# Patient Record
Sex: Male | Born: 1985 | Race: White | Hispanic: No | Marital: Married | Smoking: Never smoker
Health system: Southern US, Community
[De-identification: ages and names within clinical notes are randomized; demographics above are authoritative.]

## PROBLEM LIST (undated history)

## (undated) DIAGNOSIS — L400 Psoriasis vulgaris: Secondary | ICD-10-CM

## (undated) DIAGNOSIS — F32A Depression, unspecified: Secondary | ICD-10-CM

## (undated) DIAGNOSIS — F419 Anxiety disorder, unspecified: Secondary | ICD-10-CM

## (undated) DIAGNOSIS — E785 Hyperlipidemia, unspecified: Secondary | ICD-10-CM

## (undated) DIAGNOSIS — M47816 Spondylosis without myelopathy or radiculopathy, lumbar region: Secondary | ICD-10-CM

## (undated) DIAGNOSIS — I10 Essential (primary) hypertension: Secondary | ICD-10-CM

## (undated) DIAGNOSIS — F3181 Bipolar II disorder: Secondary | ICD-10-CM

## (undated) HISTORY — DX: Psoriasis vulgaris: L40.0

## (undated) HISTORY — PX: KNEE SURGERY: SHX244

## (undated) HISTORY — DX: Hyperlipidemia, unspecified: E78.5

## (undated) HISTORY — PX: ROTATOR CUFF REPAIR: SHX139

## (undated) HISTORY — DX: Spondylosis without myelopathy or radiculopathy, lumbar region: M47.816

## (undated) HISTORY — PX: AMPUTATION: SHX166

## (undated) HISTORY — PX: FRACTURE SURGERY: SHX138

---
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
## 2007-04-07 ENCOUNTER — Emergency Department (HOSPITAL_COMMUNITY): Admission: EM | Admit: 2007-04-07 | Discharge: 2007-04-08 | Payer: Self-pay | Admitting: Emergency Medicine

## 2010-06-30 NOTE — Discharge Summary (Signed)
   Eric [name], Eric [name] NO.:  [id number]   MEDICAL RECORD NO.:  [id number]                  PATIENT TYPE:   LOCATION:                                       FACILITY:   PHYSICIAN:  Donna Bernard, M.D.             DATE OF BIRTH:   DATE OF ADMISSION:  DATE OF DISCHARGE:  06/16/2002                                 DISCHARGE SUMMARY   FINAL DIAGNOSES:  1. Pyelonephritis.  2. Type 1 diabetes.   FINAL DISPOSITION:  Patient discharged to home.   DISCHARGE MEDICATIONS:  Antibiotics to be picked up at [facility].   FOLLOW UP:  Follow up in the office in one week.   DIET:  1. Maintain usual diet.  2. Maintain usual insulin dose.   INITIAL HISTORY AND PHYSICAL:  Please see H&P as dictated.   HOSPITAL COURSE:  This patient is an 25 year old patient with a history of  type 1 diabetes who presented to the hospital on the day of admission with  complaints of flank pain, primarily on the right side.  He did have some  nausea.  His blood work showed pyuria.  He was given an injection for  presumed pyelonephritis.  He presented later in the evening feeling worse,  complaining of more severe flank pain and chills, and white blood count at  15,000.  CT scan was done to rule out other pathology; this was normal.  Patient was admitted to the hospital; he was started on sliding-scale  insulin.  He was given IV antibiotics, Unasyn 3 grams IV q.8h.  Additionally, he was given IV fluids and morphine as needed for pain.  Over  the next several days, he slowly improved.  On the day of discharge, patient  was stable and feeling relatively well; he still had slight tenderness in  the right flank but completely negative abdominal scan.  His white blood  count had returned to near normal.  His urinalysis had returned to normal.  Patient was discharged home with diagnoses and disposition as noted above.                                               Donna Bernard, M.D.    [name]  D:  07/01/2002  T:  07/01/2002  Job:  [id number]

## 2010-06-30 NOTE — Procedures (Signed)
   NAMECONN, TROMBETTA NO.:  [id number]   MEDICAL RECORD NO.:  [id number]                  PATIENT TYPE:   LOCATION:                                       FACILITY:   PHYSICIAN:  Fredirick Maudlin, M.D.              DATE OF BIRTH:   DATE OF PROCEDURE:  DATE OF DISCHARGE:                              PULMONARY FUNCTION TEST   1. Spirometry numbers are normal but the flow volume loop was unusual in its     shape and this could indicate something like an extrathoracic     obstruction.  2. Lung volume show normal total lung capacity.  There is some air trapping.  3. DLCO mildly reduced.                                                Fredirick Maudlin, M.D.    EL[facility]/MEDQ  D:  10/16/2001  T:  10/17/2001  Job:  [id number]

## 2011-02-01 ENCOUNTER — Inpatient Hospital Stay (HOSPITAL_COMMUNITY)
Admission: EM | Admit: 2011-02-01 | Discharge: 2011-02-03 | DRG: 295 | Disposition: A | Discharge status: Home / Self Care | Payer: BC Managed Care – PPO | Attending: Internal Medicine | Admitting: Internal Medicine

## 2011-02-01 ENCOUNTER — Encounter: Payer: Self-pay | Admitting: *Deleted

## 2011-02-01 DIAGNOSIS — Z794 Long term (current) use of insulin: Secondary | ICD-10-CM

## 2011-02-01 DIAGNOSIS — E860 Dehydration: Secondary | ICD-10-CM | POA: Diagnosis present

## 2011-02-01 DIAGNOSIS — D72829 Elevated white blood cell count, unspecified: Secondary | ICD-10-CM | POA: Diagnosis present

## 2011-02-01 DIAGNOSIS — M545 Low back pain, unspecified: Secondary | ICD-10-CM | POA: Diagnosis present

## 2011-02-01 DIAGNOSIS — E1110 Type 2 diabetes mellitus with ketoacidosis without coma: Secondary | ICD-10-CM | POA: Diagnosis present

## 2011-02-01 DIAGNOSIS — I10 Essential (primary) hypertension: Secondary | ICD-10-CM | POA: Diagnosis present

## 2011-02-01 DIAGNOSIS — F329 Major depressive disorder, single episode, unspecified: Secondary | ICD-10-CM | POA: Diagnosis present

## 2011-02-01 DIAGNOSIS — IMO0001 Reserved for inherently not codable concepts without codable children: Secondary | ICD-10-CM | POA: Diagnosis present

## 2011-02-01 DIAGNOSIS — F4321 Adjustment disorder with depressed mood: Secondary | ICD-10-CM | POA: Diagnosis present

## 2011-02-01 DIAGNOSIS — Z9641 Presence of insulin pump (external) (internal): Secondary | ICD-10-CM

## 2011-02-01 DIAGNOSIS — F32A Depression, unspecified: Secondary | ICD-10-CM | POA: Diagnosis present

## 2011-02-01 DIAGNOSIS — E109 Type 1 diabetes mellitus without complications: Secondary | ICD-10-CM | POA: Diagnosis present

## 2011-02-01 DIAGNOSIS — F411 Generalized anxiety disorder: Secondary | ICD-10-CM | POA: Diagnosis present

## 2011-02-01 DIAGNOSIS — E1010 Type 1 diabetes mellitus with ketoacidosis without coma: Principal | ICD-10-CM | POA: Diagnosis present

## 2011-02-01 DIAGNOSIS — F39 Unspecified mood [affective] disorder: Secondary | ICD-10-CM | POA: Diagnosis present

## 2011-02-01 HISTORY — DX: Essential (primary) hypertension: I10

## 2011-02-01 HISTORY — DX: Anxiety disorder, unspecified: F41.9

## 2011-02-01 LAB — COMPREHENSIVE METABOLIC PANEL
ALT: 34 U/L (ref 0–53)
AST: 20 U/L (ref 0–37)
Albumin: 5 g/dL (ref 3.5–5.2)
Alkaline Phosphatase: 158 U/L — ABNORMAL HIGH (ref 39–117)
BUN: 18 mg/dL (ref 6–23)
CO2: 12 mEq/L — ABNORMAL LOW (ref 19–32)
Calcium: 10.5 mg/dL (ref 8.4–10.5)
Chloride: 91 mEq/L — ABNORMAL LOW (ref 96–112)
Creatinine, Ser: 1.2 mg/dL (ref 0.50–1.35)
GFR calc Af Amer: >90 mL/min (ref >90)
GFR calc non Af Amer: 82 mL/min — ABNORMAL LOW (ref >90)
Glucose, Bld: 502 mg/dL — ABNORMAL HIGH (ref 70–99)
Potassium: 4.6 mEq/L (ref 3.5–5.1)
Sodium: 133 mEq/L — ABNORMAL LOW (ref 135–145)
Total Bilirubin: 0.6 mg/dL (ref 0.3–1.2)
Total Protein: 8.7 g/dL — ABNORMAL HIGH (ref 6.0–8.3)

## 2011-02-01 LAB — URINE MICROSCOPIC-ADD ON

## 2011-02-01 LAB — POCT I-STAT, CHEM 8
BUN: 19 mg/dL (ref 6–23)
Calcium, Ion: 1.33 mmol/L — ABNORMAL HIGH (ref 1.12–1.32)
Chloride: 104 mEq/L (ref 96–112)
Creatinine, Ser: 1.3 mg/dL (ref 0.50–1.35)
Glucose, Bld: 496 mg/dL — ABNORMAL HIGH (ref 70–99)
HCT: 52 % (ref 39.0–52.0)
Hemoglobin: 17.7 g/dL — ABNORMAL HIGH (ref 13.0–17.0)
Potassium: 4.6 mEq/L (ref 3.5–5.1)
Sodium: 134 mEq/L — ABNORMAL LOW (ref 135–145)
TCO2: 13 mmol/L (ref 0–100)

## 2011-02-01 LAB — CK TOTAL AND CKMB (NOT AT ARMC)
CK, MB: 3.7 ng/mL (ref 0.3–4.0)
Relative Index: 2.7 — ABNORMAL HIGH (ref 0.0–2.5)
Total CK: 135 U/L (ref 7–232)

## 2011-02-01 LAB — URINALYSIS, ROUTINE W REFLEX MICROSCOPIC
Bilirubin Urine: NEGATIVE
Glucose, UA: >1000 mg/dL — AB
Ketones, ur: >80 mg/dL — AB
Leukocytes, UA: NEGATIVE
Nitrite: NEGATIVE
Protein, ur: NEGATIVE mg/dL
Specific Gravity, Urine: 1.025 (ref 1.005–1.030)
Urobilinogen, UA: 0.2 mg/dL (ref 0.0–1.0)
pH: 5.5 (ref 5.0–8.0)

## 2011-02-01 LAB — BLOOD GAS, ARTERIAL
Acid-Base Excess: 18.4 mmol/L — ABNORMAL HIGH (ref 0.0–2.0)
Bicarbonate: 8.4 mEq/L — ABNORMAL LOW (ref 20.0–24.0)
Drawn by: 22223
FIO2: 21 %
O2 Saturation: 98.9 %
TCO2: 7.7 mmol/L (ref 0–100)
pCO2 arterial: 23.6 mmHg — ABNORMAL LOW (ref 35.0–45.0)
pH, Arterial: 7.178 — CL (ref 7.350–7.450)
pO2, Arterial: 123 mmHg — ABNORMAL HIGH (ref 80.0–100.0)

## 2011-02-01 LAB — CBC
HCT: 48.5 % (ref 39.0–52.0)
Hemoglobin: 17.3 g/dL — ABNORMAL HIGH (ref 13.0–17.0)
MCH: 30.7 pg (ref 26.0–34.0)
MCHC: 35.7 g/dL (ref 30.0–36.0)
MCV: 86 fL (ref 78.0–100.0)
Platelets: 314 10*3/uL (ref 150–400)
RBC: 5.64 MIL/uL (ref 4.22–5.81)
RDW: 12.3 % (ref 11.5–15.5)
WBC: 13.9 10*3/uL — ABNORMAL HIGH (ref 4.0–10.5)

## 2011-02-01 LAB — DIFFERENTIAL
Basophils Absolute: 0 10*3/uL (ref 0.0–0.1)
Basophils Relative: 0 % (ref 0–1)
Eosinophils Absolute: 0.1 10*3/uL (ref 0.0–0.7)
Eosinophils Relative: 0 % (ref 0–5)
Lymphocytes Relative: 7 % — ABNORMAL LOW (ref 12–46)
Lymphs Abs: 1 10*3/uL (ref 0.7–4.0)
Monocytes Absolute: 0.3 10*3/uL (ref 0.1–1.0)
Monocytes Relative: 2 % — ABNORMAL LOW (ref 3–12)
Neutro Abs: 12.5 10*3/uL — ABNORMAL HIGH (ref 1.7–7.7)
Neutrophils Relative %: 90 % — ABNORMAL HIGH (ref 43–77)

## 2011-02-01 LAB — LIPASE, BLOOD: Lipase: 12 U/L (ref 11–59)

## 2011-02-01 LAB — GLUCOSE, CAPILLARY
Glucose-Capillary: 189 mg/dL — ABNORMAL HIGH (ref 70–99)
Glucose-Capillary: 489 mg/dL — ABNORMAL HIGH (ref 70–99)

## 2011-02-01 MED ORDER — SODIUM CHLORIDE 0.9 % IV SOLN: Freq: Once | INTRAVENOUS | Status: DC

## 2011-02-01 MED ORDER — ONDANSETRON HCL 4 MG/2ML IJ SOLN
INTRAMUSCULAR | Status: AC
  Administered 2011-02-01: 4 mg
  Filled 2011-02-01: qty 2

## 2011-02-01 MED ORDER — SODIUM CHLORIDE 0.9 % IV SOLN: INTRAVENOUS | Status: DC

## 2011-02-01 MED ORDER — HYDROMORPHONE HCL PF 1 MG/ML IJ SOLN
0.5000 mg | Freq: Once | INTRAMUSCULAR | Status: AC
  Administered 2011-02-01: 0.5 mg via INTRAVENOUS
  Filled 2011-02-01: qty 1

## 2011-02-01 MED ORDER — SODIUM CHLORIDE 0.9 % IV SOLN
INTRAVENOUS | Status: DC
  Filled 2011-02-01: qty 1

## 2011-02-01 MED ORDER — ENOXAPARIN SODIUM 40 MG/0.4ML ~~LOC~~ SOLN: 40.0000 mg | SUBCUTANEOUS | Status: DC

## 2011-02-01 MED ORDER — SODIUM CHLORIDE 0.9 % IV SOLN
Freq: Once | INTRAVENOUS | Status: AC
  Administered 2011-02-01 (×2): via INTRAVENOUS

## 2011-02-01 MED ORDER — DEXTROSE-NACL 5-0.45 % IV SOLN: INTRAVENOUS | Status: DC

## 2011-02-01 MED ORDER — POTASSIUM CHLORIDE 10 MEQ/100ML IV SOLN: 10.0000 meq | INTRAVENOUS | Status: DC

## 2011-02-01 MED ORDER — DEXTROSE 50 % IV SOLN: 25.0000 mL | INTRAVENOUS | Status: DC | PRN

## 2011-02-01 NOTE — ED Notes (Signed)
Family member says recent loss of stillborn child

## 2011-02-01 NOTE — ED Notes (Signed)
Respiratory at the bedside

## 2011-02-01 NOTE — ED Notes (Signed)
Vomiting for 2 days , no diarrhea.no fever, or chills

## 2011-02-01 NOTE — H&P (Addendum)
Eric [name] is an 25 y.o. male.    PCP: Colette Ribas, MD, MD   His endocrinologist is Dr. Talmage Nap in [place]  Chief Complaint: High blood sugar and lower back pain  HPI: This is a 25 year old, white male with a past medical history of type 1 diabetes, who was in his usual state of health till a few days ago, when he ran out of his insulin for the insulin pump that he uses to control his diabetes. Unfortunately, has not been using any other long-acting insulin and has been giving himself the short-acting Humalog. Today he checked his blood sugar at home, and it was greater than 500 and he decided to come in to the hospital. Has been having nausea along with vomiting. Denies any fever or chills. Denies any abdominal pain. Denies any sick contacts. Denies any difficulty with urination.  A week or so ago patient apparently had a stillborn child. He has been very depressed about it and has required medications in the form of Valium, citalopram, trazodone. These were prescribed to him within the last 10 days or so. He's been taking Valium, almost every 3-4 hours. He denies any intention to hurt himself.  He also tells me that he has been having lower back pain that started once the the nausea and vomiting began. Denies any difficulty with ambulation. The pain is worse when he has to bend down to pick up something from the floor. Denies any shooting pain down his legs. The pain is across the lower back equal, and both sides. He tells me he was started on the pravastatin recently. He was also started on metoprolol recently. Prior to all these new medications being initiated in the last week or so he was only on insulin.   Prior to Admission medications   Medication Sig Start Date End Date Taking? Authorizing Provider  diazepam (VALIUM) 10 MG tablet Take 10 mg by mouth 3 (three) times daily as needed. For anxiety    Yes Historical Provider, MD  metoprolol tartrate (LOPRESSOR) 25 MG tablet  Take 25 mg by mouth 2 (two) times daily.     Yes Historical Provider, MD  citalopram (CELEXA) 10 MG tablet Take 10 mg by mouth daily.      Historical Provider, MD  insulin lispro (HUMALOG) 100 UNIT/ML injection by Continuous infusion (non-IV) route daily. Based on blood sugar levels per patient     Historical Provider, MD  pravastatin (PRAVACHOL) 10 MG tablet Take 10 mg by mouth at bedtime.      Historical Provider, MD  traZODone (DESYREL) 50 MG tablet Take 50 mg by mouth at bedtime.      Historical Provider, MD    Allergies: No Known Allergies  Past Medical History  Diagnosis Date  . Diabetes mellitus   . Hypertension   . Anxiety     Past Surgical History  Procedure Date  . Knee surgery     Social History:  reports that he has never smoked. He does not have any smokeless tobacco history on file. He reports that he drinks alcohol. He reports that he does not use illicit drugs.  Family History:  Family History  Problem Relation Age of Onset  . Hypertension Mother   . Hypertension Father     Review of Systems - History obtained from the patient General ROS: positive for  - fatigue Psychological ROS: positive for - depression Ophthalmic ROS: negative ENT ROS: negative Hematological and Lymphatic ROS: negative Endocrine ROS: negative  Respiratory ROS: no cough, shortness of breath, or wheezing Cardiovascular ROS: no chest pain or dyspnea on exertion Gastrointestinal ROS: no abdominal pain, change in bowel habits, or black or bloody stools Genito-Urinary ROS: no dysuria, trouble voiding, or hematuria Musculoskeletal ROS: positive for - pain in back - lower Neurological ROS: negative Dermatological ROS: negative  Physical Examination Blood pressure 128/77, pulse 115, temperature 98.3 F (36.8 C), temperature source Oral, resp. rate 22, height 5' 9.5" (1.765 m), weight 68.04 kg (150 lb), SpO2 99.00%.  General appearance: alert, cooperative, appears stated age and no  distress Head: Normocephalic, without obvious abnormality, atraumatic Eyes: conjunctivae/corneas clear. PERRL, EOM's intact. Fundi benign. Throat: lips, mucosa, and tongue normal; teeth and gums normal and dry mm Neck: no adenopathy, no carotid bruit, no JVD, supple, symmetrical, trachea midline and thyroid not enlarged, symmetric, no tenderness/mass/nodules Resp: clear to auscultation bilaterally Cardio: regular rate and rhythm, S1, S2 normal, no murmur, click, rub or gallop GI: soft, non-tender; bowel sounds normal; no masses,  no organomegaly Extremities: extremities normal, atraumatic, no cyanosis or edema Pulses: 2+ and symmetric Skin: Skin color, texture, turgor normal. No rashes or lesions Lymph nodes: Cervical, supraclavicular, and axillary nodes normal. Neurologic: Alert and oriented X 3, normal strength and tone. Normal symmetric reflexes. Normal coordination and gait Examination of the back did not reveal any abnormalities. There was no point tenderness in the lower back. There was no point tenderness over the spine area. Straight leg raising test were negative.   Results for orders placed during the hospital encounter of 02/01/11 (from the past 48 hour(s))  GLUCOSE, CAPILLARY     Status: Abnormal   Collection Time   02/01/11  8:52 PM      Component Value Range Comment   Glucose-Capillary 489 (*) 70 - 99 (mg/dL)   CBC     Status: Abnormal   Collection Time   02/01/11  9:14 PM      Component Value Range Comment   WBC 13.9 (*) 4.0 - 10.5 (K/uL)    RBC 5.64  4.22 - 5.81 (MIL/uL)    Hemoglobin 17.3 (*) 13.0 - 17.0 (g/dL)    HCT 16.1  09.6 - 04.5 (%)    MCV 86.0  78.0 - 100.0 (fL)    MCH 30.7  26.0 - 34.0 (pg)    MCHC 35.7  30.0 - 36.0 (g/dL)    RDW 40.9  81.1 - 91.4 (%)    Platelets 314  150 - 400 (K/uL)   DIFFERENTIAL     Status: Abnormal   Collection Time   02/01/11  9:14 PM      Component Value Range Comment   Neutrophils Relative 90 (*) 43 - 77 (%)    Neutro Abs  12.5 (*) 1.7 - 7.7 (K/uL)    Lymphocytes Relative 7 (*) 12 - 46 (%)    Lymphs Abs 1.0  0.7 - 4.0 (K/uL)    Monocytes Relative 2 (*) 3 - 12 (%)    Monocytes Absolute 0.3  0.1 - 1.0 (K/uL)    Eosinophils Relative 0  0 - 5 (%)    Eosinophils Absolute 0.1  0.0 - 0.7 (K/uL)    Basophils Relative 0  0 - 1 (%)    Basophils Absolute 0.0  0.0 - 0.1 (K/uL)   COMPREHENSIVE METABOLIC PANEL     Status: Abnormal   Collection Time   02/01/11  9:14 PM      Component Value Range Comment   Sodium 133 (*) 135 -  145 (mEq/L)    Potassium 4.6  3.5 - 5.1 (mEq/L)    Chloride 91 (*) 96 - 112 (mEq/L)    CO2 12 (*) 19 - 32 (mEq/L)    Glucose, Bld 502 (*) 70 - 99 (mg/dL)    BUN 18  6 - 23 (mg/dL)    Creatinine, Ser 1.19  0.50 - 1.35 (mg/dL)    Calcium 14.7  8.4 - 10.5 (mg/dL)    Total Protein 8.7 (*) 6.0 - 8.3 (g/dL)    Albumin 5.0  3.5 - 5.2 (g/dL)    AST 20  0 - 37 (U/L)    ALT 34  0 - 53 (U/L)    Alkaline Phosphatase 158 (*) 39 - 117 (U/L)    Total Bilirubin 0.6  0.3 - 1.2 (mg/dL)    GFR calc non Af Amer 82 (*) >90 (mL/min)    GFR calc Af Amer >90  >90 (mL/min)   URINALYSIS, ROUTINE W REFLEX MICROSCOPIC     Status: Abnormal   Collection Time   02/01/11  9:24 PM      Component Value Range Comment   Color, Urine YELLOW  YELLOW     APPearance CLEAR  CLEAR     Specific Gravity, Urine 1.025  1.005 - 1.030     pH 5.5  5.0 - 8.0     Glucose, UA >1000 (*) NEGATIVE (mg/dL)    Hgb urine dipstick TRACE (*) NEGATIVE     Bilirubin Urine NEGATIVE  NEGATIVE     Ketones, ur >80 (*) NEGATIVE (mg/dL)    Protein, ur NEGATIVE  NEGATIVE (mg/dL)    Urobilinogen, UA 0.2  0.0 - 1.0 (mg/dL)    Nitrite NEGATIVE  NEGATIVE     Leukocytes, UA NEGATIVE  NEGATIVE    URINE MICROSCOPIC-ADD ON     Status: Normal   Collection Time   02/01/11  9:24 PM      Component Value Range Comment   Squamous Epithelial / LPF RARE  RARE     RBC / HPF 0-2  <3 (RBC/hpf)    Bacteria, UA RARE  RARE    POCT I-STAT, CHEM 8     Status: Abnormal    Collection Time   02/01/11  9:29 PM      Component Value Range Comment   Sodium 134 (*) 135 - 145 (mEq/L)    Potassium 4.6  3.5 - 5.1 (mEq/L)    Chloride 104  96 - 112 (mEq/L)    BUN 19  6 - 23 (mg/dL)    Creatinine, Ser 8.29  0.50 - 1.35 (mg/dL)    Glucose, Bld 562 (*) 70 - 99 (mg/dL)    Calcium, Ion 1.30 (*) 1.12 - 1.32 (mmol/L)    TCO2 13  0 - 100 (mmol/L)    Hemoglobin 17.7 (*) 13.0 - 17.0 (g/dL)    HCT 86.5  78.4 - 69.6 (%)   BLOOD GAS, ARTERIAL     Status: Abnormal   Collection Time   02/01/11  9:45 PM      Component Value Range Comment   FIO2 21.00      Delivery systems ROOM AIR      pH, Arterial 7.178 (*) 7.350 - 7.450     pCO2 arterial 23.6 (*) 35.0 - 45.0 (mmHg)    pO2, Arterial 123.0 (*) 80.0 - 100.0 (mmHg)    Bicarbonate 8.4 (*) 20.0 - 24.0 (mEq/L)    TCO2 7.7  0 - 100 (mmol/L)    Acid-Base Excess  18.4 (*) 0.0 - 2.0 (mmol/L)    O2 Saturation 98.9      Collection site RIGHT RADIAL      Drawn by [id number]      Sample type ARTERIAL      Allens test (pass/fail) PASS  PASS     No results found.   Assessment/Plan  Principal Problem:  *DKA (diabetic ketoacidoses) Active Problems:  DM type 1 (diabetes mellitus, type 1)  Low back pain  Depression  Elevated blood pressure   #1 diabetic ketoacidosis. His anion gap is 30. He'll be put on the DKA protocol with IV insulin. He'll be given IV fluids aggressively. HbA1c will be checked. He's been told to be compliant with this insulin regimen in the future. He needs to have a backup plan in case the pump malfunctions. He should be on a long-acting insulin if he is not on insulin pump. All this has been told to the patient very categorically. He's been asked to followup with Dr. Talmage Nap soon as he gets out of the hospital.  #2 low back pain: Etiology for this is not entirely clear. I don't see any neurological deficits in this patient at this time. There is no abnormal finding on physical examination. Since he was started on a  statin medication recently, myopathy is a possibility and so a total CK level will be checked. We can consider getting films of his lumbar spine, which can be obtained in the morning. We'll give him pain medications to control her symptoms. UA doesn't suggest any infection and no significant RBC's.  #3 history of depression/grief reaction: This appears to be recent due to the stillborn child. Continue with the citalopram for now. Will discontinue the Valium. Will switch him to alprazolam at a lower dose. We will have our behavioral health team assess him in the morning.  #4 recent high blood pressure readings: Unclear if he truly has hypertension. Blood pressures could have been elevated recently due to his recent stress. Will monitor his blood pressures closely in the hospital here. Continue with metoprolol for now.  DVT, prophylaxis will be initiated.  Patient is a full code.  Further management decisions will depend on results of further testing and patient's response to treatment.  [facility]  [facility] Pager [phone]  02/01/2011, 11:11 PM  Patient's blood sugar decreased to 180 in the ED and his acidosis had improved. Per Rn he was feeling better without nausea or vomiting. It was felt patient could be given SQ insulin at that point. He was then sent to the floor.   However as soon as he reached the floor patient vomited. Stat labs revealed his acidosis had gotten worse. It was felt patient would be best served by a few hours of IV insulin. So he was sent to SDU.  Since his back/flank pain persisted we went ahead with Ct of his Abdomen and pelvis which revealed no spinal abnormality nor any nephrolithiasis. This could be some form of muscle sprain. Will try muscle relaxants. If symptoms do not improve will need further evaluation.  Junior Huezo 02/02/2011 4:21 AM

## 2011-02-01 NOTE — ED Notes (Signed)
CRITICAL VALUE ALERT  Critical value received:  Ph7.178,co23.6,po2 123,bicarb 8.4  Date of notification:  02/01/11  Time of notification:  2158  Critical value read back:yes  Nurse who received alert:  Kathlene Cote, RN  MD notified (1st page):  Dr. Jeraldine Loots  Time of first page:   MD notified (2nd page):  Time of second page:  Responding MD:    Time MD responded:

## 2011-02-01 NOTE — ED Provider Notes (Signed)
History  Scribed for Carleene Cooper III, MD, the patient was seen in room APA09/APA09. This chart was scribed by Candelaria Stagers. The patient's care started at 9:10 PM   CSN: [id number]  Arrival date & time 02/01/11  2041   First MD Initiated Contact with Patient 02/01/11 2108      Chief Complaint  Patient presents with  . Emesis     The history is provided by the patient.   YASEEN GILBERG is a 25 y.o. male who presents to the Emergency Department complaining of constant lower back pain and vomiting that started two days ago.  Pt reports he is diabetic and typically uses an insulin pump, but has ran out.  His sugar levels have been as high as 552 today.  Pt is tired, states he feel delirious, and that his sx have gradually gotten worse.  He also states that he has been stressed more than usual after the death of a child and was prescribed Xanax recently.  His PCP is Dr. Phillips Odor and he also see Dr. Horald Pollen.    Past Medical History  Diagnosis Date  . Diabetes mellitus   . Hypertension   . Anxiety     Past Surgical History  Procedure Date  . Knee surgery     History reviewed. No pertinent family history.  History  Substance Use Topics  . Smoking status: Never Smoker   . Smokeless tobacco: Not on file  . Alcohol Use: Yes      Review of Systems  Constitutional: Positive for appetite change.       10 Systems reviewed and are negative for acute change except as noted in the HPI.  HENT: Negative for congestion.   Eyes: Negative for discharge and redness.  Respiratory: Negative for shortness of breath.   Cardiovascular: Negative for chest pain.  Gastrointestinal: Positive for nausea.  Musculoskeletal: Positive for back pain.  Skin: Negative for rash.  Neurological: Negative for syncope and numbness.  Psychiatric/Behavioral: Positive for confusion.       No behavior change.    Allergies  Review of patient's allergies indicates no known allergies.  Home Medications     Current Outpatient Rx  Name Route Sig Dispense Refill  . DIAZEPAM 10 MG PO TABS Oral Take 10 mg by mouth 3 (three) times daily as needed. For anxiety     . METOPROLOL TARTRATE 25 MG PO TABS Oral Take 25 mg by mouth 2 (two) times daily.      Marland Kitchen CITALOPRAM HYDROBROMIDE 10 MG PO TABS Oral Take 10 mg by mouth daily.      . INSULIN LISPRO (HUMAN) 100 UNIT/ML [place] SOLN Continuous infusion (non-IV) by Continuous infusion (non-IV) route daily. Based on blood sugar levels per patient     . PRAVASTATIN SODIUM 10 MG PO TABS Oral Take 10 mg by mouth at bedtime.      . TRAZODONE HCL 50 MG PO TABS Oral Take 50 mg by mouth at bedtime.        BP 156/93  Pulse 132  Temp(Src) 98.3 F (36.8 C) (Oral)  Resp 22  Ht 5' 9.5" (1.765 m)  Wt 150 lb (68.04 kg)  BMI 21.83 kg/m2  SpO2 99%  Physical Exam  Nursing note and vitals reviewed. Constitutional: He appears well-developed and well-nourished.  HENT:  Head: Normocephalic and atraumatic.       Mouth is dry  Eyes: Right eye exhibits no discharge. Left eye exhibits no discharge.  Neck: Normal range of motion.  Neck supple.  Cardiovascular: Normal heart sounds.        tachycardic  Pulmonary/Chest: Effort normal. He exhibits no tenderness.  Abdominal: Soft. There is no tenderness. There is no rebound.  Musculoskeletal: He exhibits no tenderness.       Localized pain to the lower back, but no deformity.   Neurological:       Mental status and motor strength appears baseline for patient and situation.  Skin: No rash noted.       Some loss of skin turgor.   Psychiatric: Thought content normal.       States feeling delirious.     ED Course  CRITICAL CARE Performed by: Osvaldo Human Authorized by: Osvaldo Human Total critical care time: 60 minutes Critical care was necessary to treat or prevent imminent or life-threatening deterioration of the following conditions: dehydration and metabolic crisis. Critical care was time spent personally by  me on the following activities: discussions with consultants, development of treatment plan with patient or surrogate, evaluation of patient's response to treatment, examination of patient, obtaining history from patient or surrogate, ordering and performing treatments and interventions, ordering and review of laboratory studies, re-evaluation of patient's condition and review of old charts.    DIAGNOSTIC STUDIES Oxygen Saturation is 99% on room air, normal by my interpretation.    COORDINATION OF CARE:  9:18PM Ordered: CBC ; Differential ; I-Stat, Chem 8 ; Comprehensive metabolic panel ; Urinalysis, Routine w reflex microscopic, BLOOD GAS, ARTERIAL, 0.9 % sodium chloride infusion      Labs Reviewed  GLUCOSE, CAPILLARY - Abnormal; Notable for the following:    Glucose-Capillary 489 (*)    All other components within normal limits  CBC - Abnormal; Notable for the following:    WBC 13.9 (*)    Hemoglobin 17.3 (*)    All other components within normal limits  DIFFERENTIAL - Abnormal; Notable for the following:    Neutrophils Relative 90 (*)    Neutro Abs 12.5 (*)    Lymphocytes Relative 7 (*)    Monocytes Relative 2 (*)    All other components within normal limits  COMPREHENSIVE METABOLIC PANEL - Abnormal; Notable for the following:    Sodium 133 (*)    Chloride 91 (*)    CO2 12 (*)    Glucose, Bld 502 (*)    Total Protein 8.7 (*)    Alkaline Phosphatase 158 (*)    GFR calc non Af Amer 82 (*)    All other components within normal limits  URINALYSIS, ROUTINE W REFLEX MICROSCOPIC - Abnormal; Notable for the following:    Glucose, UA >1000 (*)    Hgb urine dipstick TRACE (*)    Ketones, ur >80 (*)    All other components within normal limits  BLOOD GAS, ARTERIAL - Abnormal; Notable for the following:    pH, Arterial 7.178 (*)    pCO2 arterial 23.6 (*)    pO2, Arterial 123.0 (*)    Bicarbonate 8.4 (*)    Acid-Base Excess 18.4 (*)    All other components within normal limits   POCT I-STAT, CHEM 8 - Abnormal; Notable for the following:    Sodium 134 (*)    Glucose, Bld 496 (*)    Calcium, Ion 1.33 (*)    Hemoglobin 17.7 (*)    All other components within normal limits  URINE MICROSCOPIC-ADD ON  I-STAT, CHEM 8  URINE CULTURE   Results for orders placed during the hospital encounter of 02/01/11  GLUCOSE, CAPILLARY      Component Value Range   Glucose-Capillary 489 (*) 70 - 99 (mg/dL)  CBC      Component Value Range   WBC 13.9 (*) 4.0 - 10.5 (K/uL)   RBC 5.64  4.22 - 5.81 (MIL/uL)   Hemoglobin 17.3 (*) 13.0 - 17.0 (g/dL)   HCT 16.1  09.6 - 04.5 (%)   MCV 86.0  78.0 - 100.0 (fL)   MCH 30.7  26.0 - 34.0 (pg)   MCHC 35.7  30.0 - 36.0 (g/dL)   RDW 40.9  81.1 - 91.4 (%)   Platelets 314  150 - 400 (K/uL)  DIFFERENTIAL      Component Value Range   Neutrophils Relative 90 (*) 43 - 77 (%)   Neutro Abs 12.5 (*) 1.7 - 7.7 (K/uL)   Lymphocytes Relative 7 (*) 12 - 46 (%)   Lymphs Abs 1.0  0.7 - 4.0 (K/uL)   Monocytes Relative 2 (*) 3 - 12 (%)   Monocytes Absolute 0.3  0.1 - 1.0 (K/uL)   Eosinophils Relative 0  0 - 5 (%)   Eosinophils Absolute 0.1  0.0 - 0.7 (K/uL)   Basophils Relative 0  0 - 1 (%)   Basophils Absolute 0.0  0.0 - 0.1 (K/uL)  COMPREHENSIVE METABOLIC PANEL      Component Value Range   Sodium 133 (*) 135 - 145 (mEq/L)   Potassium 4.6  3.5 - 5.1 (mEq/L)   Chloride 91 (*) 96 - 112 (mEq/L)   CO2 12 (*) 19 - 32 (mEq/L)   Glucose, Bld 502 (*) 70 - 99 (mg/dL)   BUN 18  6 - 23 (mg/dL)   Creatinine, Ser 7.82  0.50 - 1.35 (mg/dL)   Calcium 95.6  8.4 - 10.5 (mg/dL)   Total Protein 8.7 (*) 6.0 - 8.3 (g/dL)   Albumin 5.0  3.5 - 5.2 (g/dL)   AST 20  0 - 37 (U/L)   ALT 34  0 - 53 (U/L)   Alkaline Phosphatase 158 (*) 39 - 117 (U/L)   Total Bilirubin 0.6  0.3 - 1.2 (mg/dL)   GFR calc non Af Amer 82 (*) >90 (mL/min)   GFR calc Af Amer >90  >90 (mL/min)  URINALYSIS, ROUTINE W REFLEX MICROSCOPIC      Component Value Range   Color, Urine YELLOW  YELLOW     APPearance CLEAR  CLEAR    Specific Gravity, Urine 1.025  1.005 - 1.030    pH 5.5  5.0 - 8.0    Glucose, UA >1000 (*) NEGATIVE (mg/dL)   Hgb urine dipstick TRACE (*) NEGATIVE    Bilirubin Urine NEGATIVE  NEGATIVE    Ketones, ur >80 (*) NEGATIVE (mg/dL)   Protein, ur NEGATIVE  NEGATIVE (mg/dL)   Urobilinogen, UA 0.2  0.0 - 1.0 (mg/dL)   Nitrite NEGATIVE  NEGATIVE    Leukocytes, UA NEGATIVE  NEGATIVE   BLOOD GAS, ARTERIAL      Component Value Range   FIO2 21.00     Delivery systems ROOM AIR     pH, Arterial 7.178 (*) 7.350 - 7.450    pCO2 arterial 23.6 (*) 35.0 - 45.0 (mmHg)   pO2, Arterial 123.0 (*) 80.0 - 100.0 (mmHg)   Bicarbonate 8.4 (*) 20.0 - 24.0 (mEq/L)   TCO2 7.7  0 - 100 (mmol/L)   Acid-Base Excess 18.4 (*) 0.0 - 2.0 (mmol/L)   O2 Saturation 98.9     Collection site RIGHT RADIAL  Drawn by [id number]     Sample type ARTERIAL     Allens test (pass/fail) PASS  PASS   POCT I-STAT, CHEM 8      Component Value Range   Sodium 134 (*) 135 - 145 (mEq/L)   Potassium 4.6  3.5 - 5.1 (mEq/L)   Chloride 104  96 - 112 (mEq/L)   BUN 19  6 - 23 (mg/dL)   Creatinine, Ser 6.04  0.50 - 1.35 (mg/dL)   Glucose, Bld 540 (*) 70 - 99 (mg/dL)   Calcium, Ion 9.81 (*) 1.12 - 1.32 (mmol/L)   TCO2 13  0 - 100 (mmol/L)   Hemoglobin 17.7 (*) 13.0 - 17.0 (g/dL)   HCT 19.1  47.8 - 29.5 (%)  URINE MICROSCOPIC-ADD ON      Component Value Range   Squamous Epithelial / LPF RARE  RARE    RBC / HPF 0-2  <3 (RBC/hpf)   Bacteria, UA RARE  RARE    10:13 PM ABG's show a metabolic acidosis, glucose is high at 496 --> Dx DKA.  Rx with glucommander.  Call to Osvaldo Shipper, M.D. To admit pt.       1. DKA (diabetic ketoacidoses)   2. Elevated blood pressure   3. Low back pain   4. Depression       MDM  Diabetic ketoacidosis; will give fluids, order CBC and   I personally performed the services described in this documentation, which was scribed in my presence. The recorded information has been  reviewed and considered.    Carleene Cooper III, MD 02/02/11 2218

## 2011-02-01 NOTE — ED Notes (Signed)
Report given to Laurel, Charity fundraiser.

## 2011-02-01 NOTE — ED Notes (Signed)
Critical results called on patient by kimberly. Documented, information given to dr. Jeraldine Loots.

## 2011-02-02 ENCOUNTER — Encounter (HOSPITAL_COMMUNITY): Payer: Self-pay | Admitting: *Deleted

## 2011-02-02 ENCOUNTER — Inpatient Hospital Stay (HOSPITAL_COMMUNITY): Payer: BC Managed Care – PPO

## 2011-02-02 LAB — BASIC METABOLIC PANEL
BUN: 12 mg/dL (ref 6–23)
BUN: 12 mg/dL (ref 6–23)
BUN: 15 mg/dL (ref 6–23)
BUN: 5 mg/dL — ABNORMAL LOW (ref 6–23)
BUN: 7 mg/dL (ref 6–23)
BUN: 9 mg/dL (ref 6–23)
CO2: 10 mEq/L — CL (ref 19–32)
CO2: 14 mEq/L — ABNORMAL LOW (ref 19–32)
CO2: 17 mEq/L — ABNORMAL LOW (ref 19–32)
CO2: 21 mEq/L (ref 19–32)
CO2: 22 mEq/L (ref 19–32)
CO2: 9 mEq/L — CL (ref 19–32)
Calcium: 8.6 mg/dL (ref 8.4–10.5)
Calcium: 8.6 mg/dL (ref 8.4–10.5)
Calcium: 8.8 mg/dL (ref 8.4–10.5)
Calcium: 8.9 mg/dL (ref 8.4–10.5)
Calcium: 9 mg/dL (ref 8.4–10.5)
Calcium: 9.1 mg/dL (ref 8.4–10.5)
Chloride: 101 mEq/L (ref 96–112)
Chloride: 102 mEq/L (ref 96–112)
Chloride: 104 mEq/L (ref 96–112)
Chloride: 105 mEq/L (ref 96–112)
Chloride: 105 mEq/L (ref 96–112)
Chloride: 98 mEq/L (ref 96–112)
Creatinine, Ser: 0.88 mg/dL (ref 0.50–1.35)
Creatinine, Ser: 0.95 mg/dL (ref 0.50–1.35)
Creatinine, Ser: 0.96 mg/dL (ref 0.50–1.35)
Creatinine, Ser: 1 mg/dL (ref 0.50–1.35)
Creatinine, Ser: 1.01 mg/dL (ref 0.50–1.35)
Creatinine, Ser: 1.02 mg/dL (ref 0.50–1.35)
GFR calc Af Amer: >90 mL/min (ref >90)
GFR calc Af Amer: >90 mL/min (ref >90)
GFR calc Af Amer: >90 mL/min (ref >90)
GFR calc Af Amer: >90 mL/min (ref >90)
GFR calc Af Amer: >90 mL/min (ref >90)
GFR calc Af Amer: >90 mL/min (ref >90)
GFR calc non Af Amer: >90 mL/min (ref >90)
GFR calc non Af Amer: >90 mL/min (ref >90)
GFR calc non Af Amer: >90 mL/min (ref >90)
GFR calc non Af Amer: >90 mL/min (ref >90)
GFR calc non Af Amer: >90 mL/min (ref >90)
GFR calc non Af Amer: >90 mL/min (ref >90)
Glucose, Bld: 102 mg/dL — ABNORMAL HIGH (ref 70–99)
Glucose, Bld: 185 mg/dL — ABNORMAL HIGH (ref 70–99)
Glucose, Bld: 249 mg/dL — ABNORMAL HIGH (ref 70–99)
Glucose, Bld: 252 mg/dL — ABNORMAL HIGH (ref 70–99)
Glucose, Bld: 281 mg/dL — ABNORMAL HIGH (ref 70–99)
Glucose, Bld: 299 mg/dL — ABNORMAL HIGH (ref 70–99)
Potassium: 3.4 mEq/L — ABNORMAL LOW (ref 3.5–5.1)
Potassium: 3.5 mEq/L (ref 3.5–5.1)
Potassium: 4.2 mEq/L (ref 3.5–5.1)
Potassium: 4.9 mEq/L (ref 3.5–5.1)
Potassium: 5 mEq/L (ref 3.5–5.1)
Potassium: 5.1 mEq/L (ref 3.5–5.1)
Sodium: 133 mEq/L — ABNORMAL LOW (ref 135–145)
Sodium: 134 mEq/L — ABNORMAL LOW (ref 135–145)
Sodium: 135 mEq/L (ref 135–145)
Sodium: 137 mEq/L (ref 135–145)
Sodium: 138 mEq/L (ref 135–145)
Sodium: 139 mEq/L (ref 135–145)

## 2011-02-02 LAB — GLUCOSE, CAPILLARY
Glucose-Capillary: 279 mg/dL — ABNORMAL HIGH (ref 70–99)
Glucose-Capillary: 298 mg/dL — ABNORMAL HIGH (ref 70–99)
Glucose-Capillary: 214 mg/dL — ABNORMAL HIGH (ref 70–99)
Glucose-Capillary: 205 mg/dL — ABNORMAL HIGH (ref 70–99)
Glucose-Capillary: 145 mg/dL — ABNORMAL HIGH (ref 70–99)
Glucose-Capillary: 100 mg/dL — ABNORMAL HIGH (ref 70–99)
Glucose-Capillary: 95 mg/dL (ref 70–99)
Glucose-Capillary: 126 mg/dL — ABNORMAL HIGH (ref 70–99)
Glucose-Capillary: 317 mg/dL — ABNORMAL HIGH (ref 70–99)
Glucose-Capillary: 252 mg/dL — ABNORMAL HIGH (ref 70–99)
Glucose-Capillary: 227 mg/dL — ABNORMAL HIGH (ref 70–99)
Glucose-Capillary: 273 mg/dL — ABNORMAL HIGH (ref 70–99)
Glucose-Capillary: 295 mg/dL — ABNORMAL HIGH (ref 70–99)
Glucose-Capillary: 286 mg/dL — ABNORMAL HIGH (ref 70–99)
Glucose-Capillary: 277 mg/dL — ABNORMAL HIGH (ref 70–99)
Glucose-Capillary: 155 mg/dL — ABNORMAL HIGH (ref 70–99)

## 2011-02-02 LAB — CBC
HCT: 39.5 % (ref 39.0–52.0)
Hemoglobin: 14.2 g/dL (ref 13.0–17.0)
MCH: 30.2 pg (ref 26.0–34.0)
MCHC: 35.9 g/dL (ref 30.0–36.0)
MCV: 84 fL (ref 78.0–100.0)
Platelets: 218 10*3/uL (ref 150–400)
RBC: 4.7 MIL/uL (ref 4.22–5.81)
RDW: 12.4 % (ref 11.5–15.5)
WBC: 14.6 10*3/uL — ABNORMAL HIGH (ref 4.0–10.5)

## 2011-02-02 LAB — HEMOGLOBIN A1C
Hgb A1c MFr Bld: 8.8 % — ABNORMAL HIGH (ref <5.7)
Mean Plasma Glucose: 206 mg/dL — ABNORMAL HIGH (ref <117)

## 2011-02-02 LAB — MRSA PCR SCREENING: MRSA by PCR: NEGATIVE

## 2011-02-02 MED ORDER — SODIUM CHLORIDE 0.9 % IJ SOLN
INTRAMUSCULAR | Status: AC
  Administered 2011-02-02: 10 mL
  Filled 2011-02-02: qty 3

## 2011-02-02 MED ORDER — INSULIN GLARGINE 100 UNIT/ML ~~LOC~~ SOLN: 10.0000 [IU] | Freq: Two times a day (BID) | SUBCUTANEOUS | Status: DC

## 2011-02-02 MED ORDER — POTASSIUM CHLORIDE 10 MEQ/100ML IV SOLN: 10.0000 meq | INTRAVENOUS | Status: DC

## 2011-02-02 MED ORDER — ONDANSETRON HCL 4 MG/2ML IJ SOLN
4.0000 mg | Freq: Four times a day (QID) | INTRAMUSCULAR | Status: DC | PRN
  Administered 2011-02-02: 4 mg via INTRAVENOUS
  Filled 2011-02-02: qty 2

## 2011-02-02 MED ORDER — METOPROLOL TARTRATE 25 MG PO TABS
25.0000 mg | ORAL_TABLET | Freq: Two times a day (BID) | ORAL | Status: DC
  Administered 2011-02-02 – 2011-02-03 (×4): 25 mg via ORAL
  Filled 2011-02-02 (×4): qty 1

## 2011-02-02 MED ORDER — INSULIN ASPART 100 UNIT/ML ~~LOC~~ SOLN: 0.0000 [IU] | Freq: Every day | SUBCUTANEOUS | Status: DC

## 2011-02-02 MED ORDER — INSULIN ASPART 100 UNIT/ML ~~LOC~~ SOLN: 0.0000 [IU] | Freq: Three times a day (TID) | SUBCUTANEOUS | Status: DC

## 2011-02-02 MED ORDER — PNEUMOCOCCAL VAC POLYVALENT 25 MCG/0.5ML IJ INJ
0.5000 mL | INJECTION | INTRAMUSCULAR | Status: AC
  Administered 2011-02-03: 0.5 mL via INTRAMUSCULAR
  Filled 2011-02-02: qty 0.5

## 2011-02-02 MED ORDER — CITALOPRAM HYDROBROMIDE 20 MG PO TABS
10.0000 mg | ORAL_TABLET | Freq: Every day | ORAL | Status: DC
  Administered 2011-02-02 – 2011-02-03 (×2): 10 mg via ORAL
  Filled 2011-02-02 (×2): qty 1

## 2011-02-02 MED ORDER — DEXTROSE-NACL 5-0.45 % IV SOLN
INTRAVENOUS | Status: DC
  Administered 2011-02-02: 125 mL via INTRAVENOUS

## 2011-02-02 MED ORDER — SODIUM CHLORIDE 0.9 % IV SOLN
INTRAVENOUS | Status: DC
  Administered 2011-02-02 – 2011-02-03 (×3): 150 mL via INTRAVENOUS

## 2011-02-02 MED ORDER — INSULIN GLARGINE 100 UNIT/ML ~~LOC~~ SOLN
SUBCUTANEOUS | Status: AC
  Filled 2011-02-02: qty 3

## 2011-02-02 MED ORDER — METHOCARBAMOL 100 MG/ML IJ SOLN
500.0000 mg | Freq: Four times a day (QID) | INTRAVENOUS | Status: DC | PRN
  Administered 2011-02-02: 500 mg via INTRAVENOUS
  Filled 2011-02-02 (×2): qty 5

## 2011-02-02 MED ORDER — INSULIN GLARGINE 100 UNIT/ML ~~LOC~~ SOLN
10.0000 [IU] | Freq: Once | SUBCUTANEOUS | Status: AC
  Administered 2011-02-02: 10 [IU] via SUBCUTANEOUS
  Filled 2011-02-02: qty 1

## 2011-02-02 MED ORDER — ENOXAPARIN SODIUM 40 MG/0.4ML ~~LOC~~ SOLN
40.0000 mg | SUBCUTANEOUS | Status: DC
  Administered 2011-02-02 – 2011-02-03 (×2): 40 mg via SUBCUTANEOUS
  Filled 2011-02-02 (×2): qty 0.4

## 2011-02-02 MED ORDER — INSULIN ASPART 100 UNIT/ML ~~LOC~~ SOLN: 3.0000 [IU] | Freq: Three times a day (TID) | SUBCUTANEOUS | Status: DC

## 2011-02-02 MED ORDER — INSULIN REGULAR HUMAN 100 UNIT/ML IJ SOLN
INTRAMUSCULAR | Status: AC
  Filled 2011-02-02: qty 3

## 2011-02-02 MED ORDER — OXYCODONE HCL 5 MG PO TABS
5.0000 mg | ORAL_TABLET | Freq: Four times a day (QID) | ORAL | Status: DC | PRN
  Administered 2011-02-02: 5 mg via ORAL
  Filled 2011-02-02: qty 1

## 2011-02-02 MED ORDER — ALPRAZOLAM 0.25 MG PO TABS
0.2500 mg | ORAL_TABLET | Freq: Three times a day (TID) | ORAL | Status: DC | PRN
  Administered 2011-02-02: 0.25 mg via ORAL
  Filled 2011-02-02: qty 1

## 2011-02-02 MED ORDER — DEXTROSE 50 % IV SOLN: 25.0000 mL | INTRAVENOUS | Status: DC | PRN

## 2011-02-02 MED ORDER — INSULIN ASPART 100 UNIT/ML ~~LOC~~ SOLN
4.0000 [IU] | Freq: Three times a day (TID) | SUBCUTANEOUS | Status: DC
  Administered 2011-02-03: 4 [IU] via SUBCUTANEOUS

## 2011-02-02 MED ORDER — SODIUM CHLORIDE 0.9 % IV SOLN
INTRAVENOUS | Status: DC
  Administered 2011-02-02: 02:00:00 via INTRAVENOUS

## 2011-02-02 MED ORDER — PANTOPRAZOLE SODIUM 40 MG IV SOLR
40.0000 mg | INTRAVENOUS | Status: DC
  Administered 2011-02-02 – 2011-02-03 (×2): 40 mg via INTRAVENOUS
  Filled 2011-02-02 (×2): qty 40

## 2011-02-02 MED ORDER — INSULIN GLARGINE 100 UNIT/ML ~~LOC~~ SOLN
12.0000 [IU] | Freq: Every day | SUBCUTANEOUS | Status: DC
  Administered 2011-02-02 (×2): 12 [IU] via SUBCUTANEOUS

## 2011-02-02 MED ORDER — INSULIN REGULAR HUMAN 100 UNIT/ML IJ SOLN
INTRAMUSCULAR | Status: DC
  Filled 2011-02-02: qty 1

## 2011-02-02 MED ORDER — HYDROCODONE-ACETAMINOPHEN 5-325 MG PO TABS
1.0000 | ORAL_TABLET | ORAL | Status: DC | PRN
  Administered 2011-02-02 – 2011-02-03 (×3): 2 via ORAL
  Filled 2011-02-02 (×3): qty 2

## 2011-02-02 MED ORDER — HYDROMORPHONE HCL PF 1 MG/ML IJ SOLN
1.0000 mg | INTRAMUSCULAR | Status: DC | PRN
  Administered 2011-02-02 – 2011-02-03 (×3): 1 mg via INTRAVENOUS
  Filled 2011-02-02 (×3): qty 1

## 2011-02-02 MED ORDER — PROMETHAZINE HCL 25 MG/ML IJ SOLN: 12.5000 mg | Freq: Four times a day (QID) | INTRAMUSCULAR | Status: DC | PRN

## 2011-02-02 MED ORDER — ACETAMINOPHEN 325 MG PO TABS
650.0000 mg | ORAL_TABLET | Freq: Four times a day (QID) | ORAL | Status: DC | PRN
  Administered 2011-02-02: 650 mg via ORAL
  Filled 2011-02-02: qty 2

## 2011-02-02 NOTE — Progress Notes (Signed)
CARE MANAGEMENT NOTE 02/02/2011  Patient:  DONTAVIS, TSCHANTZ   Account Number:  [id number]  Date Initiated:  02/02/2011  Documentation initiated by:  Anibal Henderson  Subjective/Objective Assessment:   admitted with DKA. Pt is from home, is independent, and will return home. Lives with family.     Action/Plan:   HH  RN set up to assist with diabetic teaching and monitoring   Anticipated DC Date:  02/02/2011   Anticipated DC Plan:        DC Planning Services  CM consult      [facility] Choice  HOME HEALTH   Choice offered to / List presented to:  C-1 Patient        HH arranged  HH-1 RN  HH-10 DISEASE MANAGEMENT      HH agency  [facility]   Status of service:  Completed, signed off Medicare Important Message given?   (If response is "NO", the following Medicare IM given date fields will be blank) Date Medicare IM given:   Date Additional Medicare IM given:    Discharge Disposition:  HOME W HOME HEALTH SERVICES  Per UR Regulation:    Comments:  02/02/11 1500 Anibal Henderson RN

## 2011-02-02 NOTE — ED Notes (Signed)
Pt transported to the floor on cardiac monitor with 2 nurse techs.

## 2011-02-02 NOTE — ED Notes (Signed)
Spoke with Dr. Rito Ehrlich about initiating insulin drip with patient having a blood glucose of 189 at this time. Orders to not initiate insulin drip at this time until additional blood work comes back.

## 2011-02-02 NOTE — Progress Notes (Signed)
02/02/11 0413 Called results of BMET to Dr Rito Ehrlich, critical CO2 of 9. Stated would place orders for patient transfer to stepdown and start on insulin drip per glucostabilizer orders. Notified ICU of bed request.

## 2011-02-02 NOTE — Progress Notes (Signed)
Patient admitted with DKA.  Patient has been on Omni-Pod pump for past 4 years, but in recent weeks he ran out of his pods and has been covering himself with Humalog q3hrs.  He started seeing Dr. Talmage Nap last Thursday and she recommended him to apply for a new pump and to obtain supplies via his insurance.    Since he has now been admitted for DKA, Dr. Talmage Nap was called and she recommends the following:  Once acidosis cleared,     1. Start Lantus 12 units:  Give Lantus 1-2 hours prior to discontinuation of IV insulin and then order Lantus 12 units qhs    2. CHO Coverage:  Give Humalog 1 unit for every 15 grams of CHO.    -While in hospital consider set dose of Novolog 4 units with meals based on 60 gm CHO diet       3. Sensitive Correction Scale  Will discuss new recommendations from Dr. Talmage Nap with patient and inform him that we do not supply insulin pump supplies. However, if patient had pump and supplies, we could provide insulin.  Patient in the process of applying for new pump.  Also spoke with RN concerning adding Dextrose to IV fluids since glucose is less than 250 mg/dL to aid in clearance of acidosis as well. Please call [phone] for any additional questions.  Thank you for the consult.

## 2011-02-02 NOTE — Progress Notes (Signed)
Subjective: He complained of of lower back pain, denies any chest pain or shortness of breath, he followup with Dr. Marliss Czar the endocrinologist, apparently he ran out of his pump  Objective: Vital signs in last 24 hours: Filed Vitals:   02/02/11 0128 02/02/11 0500 02/02/11 0600 02/02/11 0700  BP: 128/79 112/59 120/86 136/79  Pulse: 120 119  119  Temp: 98.5 F (36.9 C)     TempSrc: Oral     Resp: 20 16 16 16   Height: 5\' 9"  (1.753 m)     Weight: 65.7 kg (144 lb 13.5 oz)     SpO2: 96% 99%  100%   Weight change:   Intake/Output Summary (Last 24 hours) at 02/02/11 0908 Last data filed at 02/02/11 0259  Gross per 24 hour  Intake     20 ml  Output      0 ml  Net     20 ml   not on respiratory distress, pupil equally reactive to light and accommodation Mucosa dry no oral thrush Heart S1 and S2 tachycardic with no added sounds Lung examination normal vesicular breathing with equal air entry Abdomen soft nontender bowel sounds present Extremities without edema and peripheral pulses intact Back without area of point tenderness no incontinence, no neurological deficit,  Lab Results:  [facility] 02/02/11 0538 02/02/11 0259  NA 137 133*  K 5.1 5.0  CL 101 98  CO2 10* 9*  GLUCOSE 249* 281*  BUN 12 12  CREATININE 1.02 0.95  CALCIUM 9.1 8.9  MG -- --  PHOS -- --    Basename 02/01/11 2114  AST 20  ALT 34  ALKPHOS 158*  BILITOT 0.6  PROT 8.7*  ALBUMIN 5.0    Basename 02/01/11 2254  LIPASE 12  AMYLASE --    Basename 02/02/11 0300 02/01/11 2129 02/01/11 2114  WBC 14.6* -- 13.9*  NEUTROABS -- -- 12.5*  HGB 14.2 17.7* --  HCT 39.5 52.0 --  MCV 84.0 -- 86.0  PLT 218 -- 314    Basename 02/01/11 2254  CKTOTAL 135  CKMB 3.7  CKMBINDEX --  TROPONINI --   No components found with this basename: POCBNP:3 No results found for this basename: DDIMER:2 in the last 72 hours No results found for this basename: HGBA1C:2 in the last 72 hours No results found for this basename:  CHOL:2,HDL:2,LDLCALC:2,TRIG:2,CHOLHDL:2,LDLDIRECT:2 in the last 72 hours No results found for this basename: TSH,T4TOTAL,FREET3,T3FREE,THYROIDAB in the last 72 hours No results found for this basename: VITAMINB12:2,FOLATE:2,FERRITIN:2,TIBC:2,IRON:2,RETICCTPCT:2 in the last 72 hours  Micro Results: No results found for this or any previous visit (from the past 240 hour(s)).  Studies/Results: Ct Abdomen Pelvis Wo Contrast  02/02/2011  *RADIOLOGY REPORT*  Clinical Data: Back pain, nausea, vomiting.  CT ABDOMEN AND PELVIS WITHOUT CONTRAST  Technique:  Multidetector CT imaging of the abdomen and pelvis was performed following the standard protocol without intravenous contrast.  Comparison: None.  Findings: Limited images through the lung bases demonstrate no significant appreciable abnormality. The heart size is within normal limits. No pleural or pericardial effusion.  Intra-abdominal organ evaluation is limited without intravenous contrast.  Within this limitation, unremarkable liver with the exception of focal fat adjacent to the falciform ligament.  Unremarkable biliary system, spleen, pancreas, and adrenal glands. The kidneys are symmetric in size.  No hydronephrosis or hydroureter.  No urinary tract calculi identified.  No bowel obstruction.  No CT evidence for colitis.  Normal appendix.  No free intraperitoneal air or fluid.  No lymphadenopathy.  Normal  caliber vasculature.  Thin-walled, distended bladder.  Bilateral vast deferens calcifications (in keeping with diabetes).  No acute osseous abnormality.  IMPRESSION: No hydronephrosis or urinary tract calculi.  Normal appendix.  Original Report Authenticated By: Waneta Martins, M.D.    Medications: I have reviewed the patient's current medications. Scheduled Meds:   . sodium chloride   Intravenous Once  . citalopram  10 mg Oral Daily  . enoxaparin  40 mg Subcutaneous Q24H  .  HYDROmorphone (DILAUDID) injection  0.5 mg Intravenous Once  .  insulin glargine  10 Units Subcutaneous Once  . metoprolol tartrate  25 mg Oral BID  . ondansetron      . pantoprazole (PROTONIX) IV  40 mg Intravenous Q24H  . pneumococcal 23 valent vaccine  0.5 mL Intramuscular Tomorrow-1000  . sodium chloride      . sodium chloride      . DISCONTD: sodium chloride   Intravenous Once  . DISCONTD: enoxaparin  40 mg Subcutaneous Q24H  . DISCONTD: insulin aspart  0-15 Units Subcutaneous TID WC  . DISCONTD: insulin aspart  0-5 Units Subcutaneous QHS  . DISCONTD: insulin aspart  3 Units Subcutaneous TID WC  . DISCONTD: insulin glargine  10 Units Subcutaneous BID  . DISCONTD: potassium chloride  10 mEq Intravenous Q1H  . DISCONTD: potassium chloride  10 mEq Intravenous Q1H   Continuous Infusions:   . sodium chloride 150 mL (02/02/11 0904)  . dextrose 5 % and 0.45% NaCl    . insulin (NOVOLIN-R) infusion    . DISCONTD: sodium chloride    . DISCONTD: sodium chloride    . DISCONTD: sodium chloride 150 mL/hr at 02/02/11 0205  . DISCONTD: dextrose 5 % and 0.45% NaCl    . DISCONTD: insulin (NOVOLIN-R) infusion     PRN Meds:.acetaminophen, ALPRAZolam, dextrose, HYDROmorphone (DILAUDID) injection, methocarbamol(ROBAXIN) IV, ondansetron (ZOFRAN) IV, promethazine, DISCONTD: dextrose, DISCONTD: dextrose, DISCONTD: oxyCODONE  Assessment/Plan: 1- DKA will continue with glucommander and iv fluid, he had recent hgbc check at his MD office , we are not plan to repeat , will ask diabetic coordinator to arrange pump and insulin for the pump 2- back pain will treat symptomatically with oral pain, currently no neurological deficit 3-tachy cardia  Related to metabolic abnormalities and dehydration 4- depression:  hmoe medication ,no suicidal ideation.  LOS: 1 day  Iasha Mccalister I. 02/02/2011, 9:08 AM

## 2011-02-02 NOTE — Progress Notes (Signed)
02/02/11 0449 Called report to Consuello Masse, RN in ICU. Patient resting and denied pain at time of transfer to ICU.

## 2011-02-02 NOTE — BH Assessment (Signed)
Assessment Note   Eric [name] is an 25 y.o. male .  Pt presented to the  Hospital due to lower back pain and is found to need treatment for his diabetes. Pt reports he has been depressed due to the still born death of his son 3 weeks ago. The mother of the child was 29 weeks and 4 days and they went through labor together.  He reports this loss has sadden him but not to the point of having suicidal nor homicidal thoughts. He is not psychotic. There is no substance use or abuse. Pt reports steady full time employment and likes his job at [employer] in [place] and plans to return to work.  His back pain started 3 days ago which may triggered his depression getting worse. Pt accepted referral to [facility] outpatient office in [place] with Dr. Lolly Mustache and agrees to make an appointment upon discharge.  In formed nurse, Neysa Bonito of disposition.         Axis I: Depressive Disorder NOS Axis II: Deferred Axis III:  Past Medical History  Diagnosis Date  . Diabetes mellitus   . Hypertension   . Anxiety    Axis IV: other psychosocial or environmental problems Axis V: 61-70 mild symptoms  Past Medical History:  Past Medical History  Diagnosis Date  . Diabetes mellitus   . Hypertension   . Anxiety     Past Surgical History  Procedure Date  . Knee surgery     Family History:  Family History  Problem Relation Age of Onset  . Hypertension Mother   . Hypertension Father     Social History:  reports that he has never smoked. He does not have any smokeless tobacco history on file. He reports that he drinks alcohol. He reports that he does not use illicit drugs.  Additional Social History:    Allergies: No Known Allergies  Home Medications:  Medications Prior to Admission  Medication Dose Route Frequency Provider Last Rate Last Dose  . 0.9 %  sodium chloride infusion   Intravenous Once Carleene Cooper III, MD      . 0.9 %  sodium chloride infusion   Intravenous Continuous Gokul Krishnan  150 mL/hr at 02/02/11 0904 150 mL at 02/02/11 0904  . acetaminophen (TYLENOL) tablet 650 mg  650 mg Oral Q6H PRN Gokul Krishnan   650 mg at 02/02/11 0604  . ALPRAZolam Prudy Feeler) tablet 0.25 mg  0.25 mg Oral TID PRN Gokul Krishnan   0.25 mg at 02/02/11 0604  . citalopram (CELEXA) tablet 10 mg  10 mg Oral Daily Gokul Krishnan   10 mg at 02/02/11 1059  . dextrose 5 %-0.45 % sodium chloride infusion   Intravenous Continuous Gokul Krishnan      . dextrose 50 % solution 25 mL  25 mL Intravenous PRN Gokul Krishnan      . enoxaparin (LOVENOX) injection 40 mg  40 mg Subcutaneous Q24H Gokul Krishnan   40 mg at 02/02/11 0604  . HYDROcodone-acetaminophen (NORCO) 5-325 MG per tablet 1-2 tablet  1-2 tablet Oral Q4H PRN Hind I. Elsaid   2 tablet at 02/02/11 1254  . HYDROmorphone (DILAUDID) injection 0.5 mg  0.5 mg Intravenous Once Gokul Krishnan   0.5 mg at 02/01/11 2302  . HYDROmorphone (DILAUDID) injection 1 mg  1 mg Intravenous Q4H PRN Gokul Krishnan   1 mg at 02/02/11 0904  . insulin aspart (novoLOG) injection 0-5 Units  0-5 Units Subcutaneous QHS Hind I. Elsaid      .  insulin aspart (novoLOG) injection 0-9 Units  0-9 Units Subcutaneous TID WC Hind I. Elsaid      . insulin aspart (novoLOG) injection 4 Units  4 Units Subcutaneous TID WC Hind I. Elsaid      . insulin glargine (LANTUS) injection 10 Units  10 Units Subcutaneous Once Gokul Krishnan   10 Units at 02/02/11 0052  . insulin glargine (LANTUS) injection 12 Units  12 Units Subcutaneous QHS Hind I. Elsaid      . insulin regular (NOVOLIN R,HUMULIN R) 1 Units/mL in sodium chloride 0.9 % 100 mL infusion   Intravenous Continuous Gokul Krishnan 1.7 mL/hr at 02/02/11 0909 1.7 Units/hr at 02/02/11 0909  . methocarbamol (ROBAXIN) 500 mg in dextrose 5 % 50 mL IVPB  500 mg Intravenous Q6H PRN Gokul Krishnan   500 mg at 02/02/11 0925  . metoprolol tartrate (LOPRESSOR) tablet 25 mg  25 mg Oral BID Gokul Krishnan   25 mg at 02/02/11 1059  . ondansetron (ZOFRAN) 4 MG/2ML  injection        4 mg at 02/01/11 2058  . ondansetron (ZOFRAN) injection 4 mg  4 mg Intravenous Q6H PRN Gokul Krishnan   4 mg at 02/02/11 0259  . pantoprazole (PROTONIX) injection 40 mg  40 mg Intravenous Q24H Gokul Krishnan   40 mg at 02/02/11 0202  . pneumococcal 23 valent vaccine (PNU-IMMUNE) injection 0.5 mL  0.5 mL Intramuscular Tomorrow-1000 Gokul Krishnan      . promethazine (PHENERGAN) injection 12.5 mg  12.5 mg Intravenous Q6H PRN Gokul Krishnan      . sodium chloride 0.9 % injection        10 mL at 02/02/11 0202  . sodium chloride 0.9 % injection        10 mL at 02/02/11 0259  . DISCONTD: 0.9 %  sodium chloride infusion   Intravenous Once Carleene Cooper III, MD      . DISCONTD: 0.9 %  sodium chloride infusion   Intravenous Continuous Carleene Cooper III, MD      . DISCONTD: 0.9 %  sodium chloride infusion   Intravenous Continuous Carleene Cooper III, MD      . DISCONTD: 0.9 %  sodium chloride infusion   Intravenous Continuous Gokul Krishnan 150 mL/hr at 02/02/11 0205    . DISCONTD: dextrose 5 %-0.45 % sodium chloride infusion   Intravenous Continuous Carleene Cooper III, MD      . DISCONTD: dextrose 50 % solution 25 mL  25 mL Intravenous PRN Carleene Cooper III, MD      . DISCONTD: dextrose 50 % solution 25 mL  25 mL Intravenous PRN Osvaldo Shipper      . DISCONTD: enoxaparin (LOVENOX) injection 40 mg  40 mg Subcutaneous Q24H Carleene Cooper III, MD      . DISCONTD: insulin aspart (novoLOG) injection 0-15 Units  0-15 Units Subcutaneous TID WC Gokul Krishnan      . DISCONTD: insulin aspart (novoLOG) injection 0-5 Units  0-5 Units Subcutaneous QHS Gokul Krishnan      . DISCONTD: insulin aspart (novoLOG) injection 3 Units  3 Units Subcutaneous TID WC Gokul Krishnan      . DISCONTD: insulin glargine (LANTUS) injection 10 Units  10 Units Subcutaneous BID Gokul Krishnan      . DISCONTD: insulin regular (NOVOLIN R,HUMULIN R) 1 Units/mL in sodium chloride 0.9 % 100 mL infusion   Intravenous Continuous Carleene Cooper III, MD      . DISCONTD: oxyCODONE (Oxy IR/ROXICODONE) immediate release tablet 5 mg  5 mg Oral Q6H PRN Gokul Krishnan   5 mg at 02/02/11 0156  . DISCONTD: potassium chloride 10 mEq in 100 mL IVPB  10 mEq Intravenous Q1H Carleene Cooper III, MD      . DISCONTD: potassium chloride 10 mEq in 100 mL IVPB  10 mEq Intravenous Q1H Gokul Krishnan       No current outpatient prescriptions on file as of 02/02/2011.    OB/GYN Status:  No LMP for male patient.  General Assessment Data Location of Assessment: [facility] ED (icu) Living Arrangements: Parent (dad and grandmother) Can pt return to current living arrangement?: Yes Admission Status: Voluntary Is patient capable of signing voluntary admission?: Yes Transfer from: Acute Hospital Referral Source: Medical Floor Inpatient     Risk to self Suicidal Ideation: No Suicidal Intent: No Is patient at risk for suicide?: No Suicidal Plan?: No Access to Means: No What has been your use of drugs/alcohol within the last 12 months?: na Previous Attempts/Gestures: No Triggers for Past Attempts:  (none) Intentional Self Injurious Behavior: None Family Suicide History: No Recent stressful life event(s): Loss (Comment) (still-born son 3 weeks ago) Persecutory voices/beliefs?: No Depression: Yes Depression Symptoms: Fatigue;Insomnia;Despondent Substance abuse history and/or treatment for substance abuse?: No Suicide prevention information given to non-admitted patients: Not applicable  Risk to Others Homicidal Ideation: No Thoughts of Harm to Others: No Current Homicidal Intent: No Current Homicidal Plan: No Access to Homicidal Means: No History of harm to others?: No Assessment of Violence: None Noted Does patient have access to weapons?: No Criminal Charges Pending?: No Does patient have a court date: No  Psychosis Hallucinations: None noted Delusions: None noted  Mental Status Report Appear/Hygiene: Other (Comment) (hospital  gown) Eye Contact: Good Motor Activity: Freedom of movement Speech: Logical/coherent Level of Consciousness: Alert Mood: Depressed;Sad Affect: Sad Anxiety Level: Minimal Thought Processes: Coherent;Relevant Judgement: Unimpaired Orientation: Person;Place;Time;Situation Obsessive Compulsive Thoughts/Behaviors: None  Cognitive Functioning Concentration: Normal Memory: Recent Intact;Remote Intact IQ: Average Insight: Good Impulse Control: Good Appetite: Good Sleep: Decreased Total Hours of Sleep: 4  Vegetative Symptoms: None  Prior Inpatient Therapy Prior Inpatient Therapy: No  Prior Outpatient Therapy Prior Outpatient Therapy: No                     Additional Information CIRT Risk: No Elopement Risk: No Does patient have medical clearance?: No     Disposition:  Disposition Disposition of Patient: Outpatient treatment (Dr Lolly Mustache) Type of outpatient treatment: Adult  On Site Evaluation by:   Reviewed with Physician:     Hattie Perch Winford 02/02/2011 5:53 PM

## 2011-02-02 NOTE — Progress Notes (Signed)
02/02/11 0355 Patient c/o severe lower back pain on admission, intermittent nausea at times stated decreased some with medication, Zofran given in ER per report. Given oxycodone 5 mg po as ordered for pain and heat packs applied to lower back at patient request. Stated pain decreased some with heat packs. Vomited x 1 after receiving pain medication. Notified Dr Rito Ehrlich of patient pain, stated would place orders. Also clarified patient to be admitted to telemetry, no longer needed stepdown. Notified of patient CBG of 279. Stated to get BMET q2h and call MD with results. Orders were placed per Dr Rito Ehrlich for stat CT of abdomen/pelvis d/t severe back pain. Obtained as ordered. Patient resting comfortably after zofran and dilaudid given per new orders. Nursing to monitor.

## 2011-02-02 NOTE — Progress Notes (Signed)
CRITICAL VALUE ALERT  Critical value received:  CO2 - 9  Date of notification:  02/02/11   Time of notification:  0400  Critical value read back:yes  Nurse who received alert:  Lewis Shock, RN  MD notified (1st page):  Dr Rito Ehrlich  Time of first page:  0402  MD notified (2nd page):  Time of second page:  Responding MD:  Dr Rito Ehrlich  Time MD responded:  [phone]

## 2011-02-03 LAB — HEMOGLOBIN A1C
Hgb A1c MFr Bld: 8.6 % — ABNORMAL HIGH (ref <5.7)
Mean Plasma Glucose: 200 mg/dL — ABNORMAL HIGH (ref <117)

## 2011-02-03 LAB — BASIC METABOLIC PANEL
BUN: 6 mg/dL (ref 6–23)
BUN: 7 mg/dL (ref 6–23)
CO2: 25 mEq/L (ref 19–32)
CO2: 25 mEq/L (ref 19–32)
Calcium: 8.4 mg/dL (ref 8.4–10.5)
Calcium: 8.6 mg/dL (ref 8.4–10.5)
Chloride: 107 mEq/L (ref 96–112)
Chloride: 108 mEq/L (ref 96–112)
Creatinine, Ser: 1.12 mg/dL (ref 0.50–1.35)
Creatinine, Ser: 0.93 mg/dL (ref 0.50–1.35)
GFR calc Af Amer: >90 mL/min (ref >90)
GFR calc Af Amer: >90 mL/min (ref >90)
GFR calc non Af Amer: 89 mL/min — ABNORMAL LOW (ref >90)
GFR calc non Af Amer: >90 mL/min (ref >90)
Glucose, Bld: 228 mg/dL — ABNORMAL HIGH (ref 70–99)
Glucose, Bld: 181 mg/dL — ABNORMAL HIGH (ref 70–99)
Potassium: 3.8 mEq/L (ref 3.5–5.1)
Potassium: 3.4 mEq/L — ABNORMAL LOW (ref 3.5–5.1)
Sodium: 138 mEq/L (ref 135–145)
Sodium: 141 mEq/L (ref 135–145)

## 2011-02-03 LAB — GLUCOSE, CAPILLARY: Glucose-Capillary: 118 mg/dL — ABNORMAL HIGH (ref 70–99)

## 2011-02-03 MED ORDER — PNEUMOCOCCAL VAC POLYVALENT 25 MCG/0.5ML IJ INJ
0.5000 mL | INJECTION | INTRAMUSCULAR | Status: DC
  Filled 2011-02-03: qty 0.5

## 2011-02-03 MED ORDER — INSULIN LISPRO 100 UNIT/ML ~~LOC~~ SOLN: 1.0000 [IU] | Freq: Three times a day (TID) | SUBCUTANEOUS | Status: AC

## 2011-02-03 MED ORDER — INSULIN LISPRO 100 UNIT/ML ~~LOC~~ SOLN: 1.0000 [IU] | Freq: Every day | SUBCUTANEOUS | Status: DC

## 2011-02-03 MED ORDER — POTASSIUM CHLORIDE CRYS ER 20 MEQ PO TBCR
40.0000 meq | EXTENDED_RELEASE_TABLET | Freq: Once | ORAL | Status: AC
  Administered 2011-02-03: 40 meq via ORAL
  Filled 2011-02-03: qty 2

## 2011-02-03 MED ORDER — INSULIN GLARGINE 100 UNIT/ML ~~LOC~~ SOLN: 12.0000 [IU] | Freq: Every day | SUBCUTANEOUS | Status: DC

## 2011-02-03 NOTE — Progress Notes (Signed)
Discharge instructions reviewed with patient. Pt. Verbalized understanding of insulin regimen and importance of follow up with Dr. Talmage Nap. Reviewed signs and symptoms of DKA, hypo and hyperglycemia. Pt. Given rx for novolog and lantus, he was also given novolog and lantus pens to take home.

## 2011-02-03 NOTE — Discharge Summary (Signed)
DISCHARGE SUMMARY  [name] Eric  MR#: [id number]  DOB:[birth year]  Date of Admission: 02/01/2011 Date of Discharge: 02/03/2011  Attending Physician:[name]  Patient's WUJ:Eric [name],Eric CABOT, MD, MD  Consults: none  Discharge Diagnoses: 1- DKA RESOLVED 2- diabetes mellitus type 1 3- hypertension    4- nausea and vomiting resolved 5- lower back pain improved 6- leukocytosis  7 depression and anxiety. Procedure CTabd and pelvisIMPRESSION:  No hydronephrosis or urinary tract calculi. Normal appendix   Current Discharge Medication List    START taking these medications   Details  insulin glargine (LANTUS) 100 UNIT/ML injection Inject 12 Units into the skin at bedtime. Qty: 10 mL, Refills: 2      CONTINUE these medications which have CHANGED   Details  !! insulin lispro (HUMALOG) 100 UNIT/ML injection Inject 1-9 Units into the skin daily. Based on blood sugar levels per patient Qty: 10 mL, Refills: 0    !! insulin lispro (HUMALOG) 100 UNIT/ML injection Inject 1-10 Units into the skin 3 (three) times daily before meals. Qty: 10 mL, Refills: 12     !! - Potential duplicate medications found. Please discuss with provider.    CONTINUE these medications which have NOT CHANGED   Details  diazepam (VALIUM) 10 MG tablet Take 10 mg by mouth 3 (three) times daily as needed. For anxiety     metoprolol tartrate (LOPRESSOR) 25 MG tablet Take 25 mg by mouth 2 (two) times daily.      citalopram (CELEXA) 10 MG tablet Take 10 mg by mouth daily.      pravastatin (PRAVACHOL) 10 MG tablet Take 10 mg by mouth at bedtime.      traZODone (DESYREL) 50 MG tablet Take 50 mg by mouth at bedtime.            Hospital Course: This is 25 year old male with history of DM TYPE ,depression, he ran out of his pump supplies and apparently he has the insulin but not the pump and he started giving himself the short acting insulin, at the date of admission  He found his CBG 500, he became  nauseated and vomited multiple times, he also suffered from depression , he denies any suicidal ideation or attempt. He treated with iv insulin and aggressive hydration with potassium supplement, we contacted his endocrinologist which suggest anotus and meal coverage as per diabetic coordinator , he also advise to follow up the arrangement of his pump.he is out of DKA ,  He also admitted he has no further back pain, we felt he out of DKA , further follow up can be done by DR [facility]   Day of Discharge BP 121/87  Pulse 86  Temp(Src) 98 F (36.7 C) (Oral)  Resp 14  Ht 5\' 9"  (1.753 m)  Wt 72.4 kg (159 lb 9.8 oz)  BMI 23.57 kg/m2  SpO2 97%  Physical Exam:  not on respiratory distress or pale or jaundice Heart s1 ands2 no added sound Lung normal breathing no wheezes Abdomen soft non tender extremities no edema Results for orders placed during the hospital encounter of 02/01/11 (from the past 24 hour(s))  GLUCOSE, CAPILLARY     Status: Abnormal   Collection Time   02/02/11 10:03 AM      Component Value Range   Glucose-Capillary 100 (*) 70 - 99 (mg/dL)   Comment 1 Documented in Chart     Comment 2 Notify RN    GLUCOSE, CAPILLARY     Status: Normal   Collection Time  02/02/11 11:09 AM      Component Value Range   Glucose-Capillary 95  70 - 99 (mg/dL)  BASIC METABOLIC PANEL     Status: Abnormal   Collection Time   02/02/11 11:30 AM      Component Value Range   Sodium 139  135 - 145 (mEq/L)   Potassium 4.2  3.5 - 5.1 (mEq/L)   Chloride 105  96 - 112 (mEq/L)   CO2 17 (*) 19 - 32 (mEq/L)   Glucose, Bld 102 (*) 70 - 99 (mg/dL)   BUN 9  6 - 23 (mg/dL)   Creatinine, Ser 2.95  0.50 - 1.35 (mg/dL)   Calcium 9.0  8.4 - 62.1 (mg/dL)   GFR calc non Af Amer >90  >90 (mL/min)   GFR calc Af Amer >90  >90 (mL/min)  GLUCOSE, CAPILLARY     Status: Abnormal   Collection Time   02/02/11 12:17 PM      Component Value Range   Glucose-Capillary 126 (*) 70 - 99 (mg/dL)   Comment 1 Notify RN      Comment 2 Documented in Chart     Comment 3 Glucose Stabilizer    GLUCOSE, CAPILLARY     Status: Abnormal   Collection Time   02/02/11  1:23 PM      Component Value Range   Glucose-Capillary 317 (*) 70 - 99 (mg/dL)   Comment 1 Notify RN     Comment 2 Documented in Chart    GLUCOSE, CAPILLARY     Status: Abnormal   Collection Time   02/02/11  2:32 PM      Component Value Range   Glucose-Capillary 252 (*) 70 - 99 (mg/dL)   Comment 1 Notify RN     Comment 2 Documented in Chart     Comment 3 Glucose Stabilizer    BASIC METABOLIC PANEL     Status: Abnormal   Collection Time   02/02/11  3:10 PM      Component Value Range   Sodium 135  135 - 145 (mEq/L)   Potassium 3.5  3.5 - 5.1 (mEq/L)   Chloride 105  96 - 112 (mEq/L)   CO2 21  19 - 32 (mEq/L)   Glucose, Bld 252 (*) 70 - 99 (mg/dL)   BUN 7  6 - 23 (mg/dL)   Creatinine, Ser 3.08  0.50 - 1.35 (mg/dL)   Calcium 8.6  8.4 - 65.7 (mg/dL)   GFR calc non Af Amer >90  >90 (mL/min)   GFR calc Af Amer >90  >90 (mL/min)  GLUCOSE, CAPILLARY     Status: Abnormal   Collection Time   02/02/11  3:38 PM      Component Value Range   Glucose-Capillary 227 (*) 70 - 99 (mg/dL)   Comment 1 Documented in Chart     Comment 2 Notify RN     Comment 3 Glucose Stabilizer    GLUCOSE, CAPILLARY     Status: Abnormal   Collection Time   02/02/11  4:45 PM      Component Value Range   Glucose-Capillary 273 (*) 70 - 99 (mg/dL)   Comment 1 Notify RN     Comment 2 Documented in Chart    GLUCOSE, CAPILLARY     Status: Abnormal   Collection Time   02/02/11  5:53 PM      Component Value Range   Glucose-Capillary 295 (*) 70 - 99 (mg/dL)   Comment 1 Notify RN  Comment 2 Documented in Chart    GLUCOSE, CAPILLARY     Status: Abnormal   Collection Time   02/02/11  6:58 PM      Component Value Range   Glucose-Capillary 286 (*) 70 - 99 (mg/dL)   Comment 1 Notify RN     Comment 2 Documented in Chart    BASIC METABOLIC PANEL     Status: Abnormal    Collection Time   02/02/11  7:19 PM      Component Value Range   Sodium 134 (*) 135 - 145 (mEq/L)   Potassium 3.4 (*) 3.5 - 5.1 (mEq/L)   Chloride 104  96 - 112 (mEq/L)   CO2 22  19 - 32 (mEq/L)   Glucose, Bld 299 (*) 70 - 99 (mg/dL)   BUN 5 (*) 6 - 23 (mg/dL)   Creatinine, Ser 1.61  0.50 - 1.35 (mg/dL)   Calcium 8.6  8.4 - 09.6 (mg/dL)   GFR calc non Af Amer >90  >90 (mL/min)   GFR calc Af Amer >90  >90 (mL/min)  GLUCOSE, CAPILLARY     Status: Abnormal   Collection Time   02/02/11  8:01 PM      Component Value Range   Glucose-Capillary 277 (*) 70 - 99 (mg/dL)   Comment 1 Documented in Chart     Comment 2 Notify RN    GLUCOSE, CAPILLARY     Status: Abnormal   Collection Time   02/02/11  9:39 PM      Component Value Range   Glucose-Capillary 155 (*) 70 - 99 (mg/dL)   Comment 1 Documented in Chart     Comment 2 Notify RN    BASIC METABOLIC PANEL     Status: Abnormal   Collection Time   02/02/11 11:27 PM      Component Value Range   Sodium 138  135 - 145 (mEq/L)   Potassium 3.8  3.5 - 5.1 (mEq/L)   Chloride 107  96 - 112 (mEq/L)   CO2 25  19 - 32 (mEq/L)   Glucose, Bld 228 (*) 70 - 99 (mg/dL)   BUN 6  6 - 23 (mg/dL)   Creatinine, Ser 0.45  0.50 - 1.35 (mg/dL)   Calcium 8.4  8.4 - 40.9 (mg/dL)   GFR calc non Af Amer 89 (*) >90 (mL/min)   GFR calc Af Amer >90  >90 (mL/min)  BASIC METABOLIC PANEL     Status: Abnormal   Collection Time   02/03/11  3:52 AM      Component Value Range   Sodium 141  135 - 145 (mEq/L)   Potassium 3.4 (*) 3.5 - 5.1 (mEq/L)   Chloride 108  96 - 112 (mEq/L)   CO2 25  19 - 32 (mEq/L)   Glucose, Bld 181 (*) 70 - 99 (mg/dL)   BUN 7  6 - 23 (mg/dL)   Creatinine, Ser 8.11  0.50 - 1.35 (mg/dL)   Calcium 8.6  8.4 - 91.4 (mg/dL)   GFR calc non Af Amer >90  >90 (mL/min)   GFR calc Af Amer >90  >90 (mL/min)  GLUCOSE, CAPILLARY     Status: Abnormal   Collection Time   02/03/11  7:36 AM      Component Value Range   Glucose-Capillary 118 (*) 70 - 99  (mg/dL)   Comment 1 Notify RN     Comment 2 Documented in Chart      Disposition: HOME   Follow-up Appts: Discharge Orders  Future Orders Please Complete By Expires   Diet Carb Modified      Increase activity slowly      Discharge instructions      Comments:   Please call DR Talmage Nap on Wednesday to arrange appointment      Follow-up with Dr.Balan within 5 days and call to get your pump.   Tests Needing Follow-up:  continue monitor your CBG  And report to the hospital if above 400  Signed: Natacha Jepsen I. 02/03/2011, 8:23 AM

## 2011-02-04 LAB — URINE CULTURE
Colony Count: NO GROWTH
Culture  Setup Time: 201212220113
Culture: NO GROWTH
Special Requests: NORMAL

## 2011-04-29 ENCOUNTER — Emergency Department (HOSPITAL_COMMUNITY): Payer: BC Managed Care – PPO

## 2011-04-29 ENCOUNTER — Encounter (HOSPITAL_COMMUNITY): Payer: Self-pay | Admitting: *Deleted

## 2011-04-29 ENCOUNTER — Emergency Department (HOSPITAL_COMMUNITY)
Admission: EM | Admit: 2011-04-29 | Discharge: 2011-04-30 | Disposition: A | Discharge status: Home / Self Care | Payer: BC Managed Care – PPO | Attending: Emergency Medicine | Admitting: Emergency Medicine

## 2011-04-29 DIAGNOSIS — I10 Essential (primary) hypertension: Secondary | ICD-10-CM | POA: Insufficient documentation

## 2011-04-29 DIAGNOSIS — R42 Dizziness and giddiness: Secondary | ICD-10-CM | POA: Insufficient documentation

## 2011-04-29 DIAGNOSIS — S060X9A Concussion with loss of consciousness of unspecified duration, initial encounter: Secondary | ICD-10-CM | POA: Insufficient documentation

## 2011-04-29 DIAGNOSIS — R112 Nausea with vomiting, unspecified: Secondary | ICD-10-CM | POA: Insufficient documentation

## 2011-04-29 DIAGNOSIS — R51 Headache: Secondary | ICD-10-CM | POA: Insufficient documentation

## 2011-04-29 DIAGNOSIS — Z794 Long term (current) use of insulin: Secondary | ICD-10-CM | POA: Insufficient documentation

## 2011-04-29 DIAGNOSIS — S060XAA Concussion with loss of consciousness status unknown, initial encounter: Secondary | ICD-10-CM | POA: Insufficient documentation

## 2011-04-29 DIAGNOSIS — E119 Type 2 diabetes mellitus without complications: Secondary | ICD-10-CM | POA: Insufficient documentation

## 2011-04-29 MED ORDER — ACETAMINOPHEN 500 MG PO TABS
1000.0000 mg | ORAL_TABLET | Freq: Once | ORAL | Status: AC
  Administered 2011-04-30: 1000 mg via ORAL
  Filled 2011-04-29 (×2): qty 2

## 2011-04-29 MED ORDER — ONDANSETRON HCL 4 MG PO TABS
4.0000 mg | ORAL_TABLET | Freq: Once | ORAL | Status: AC
  Administered 2011-04-30: 4 mg via ORAL
  Filled 2011-04-29: qty 1
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
## 2011-04-30 NOTE — ED Notes (Signed)
Patient stated the medication he took before arriving to ED was ibuprofen. Administered tylenol as ordered.

## 2011-04-30 NOTE — ED Notes (Addendum)
States he took four  Green liqui-gel capsules about 15 minutes before he came to the ED.  Lights dimmed for comfort.

## 2013-01-21 ENCOUNTER — Other Ambulatory Visit (HOSPITAL_COMMUNITY): Payer: Self-pay | Admitting: Family Medicine

## 2013-01-21 ENCOUNTER — Ambulatory Visit (HOSPITAL_COMMUNITY)
Admission: RE | Admit: 2013-01-21 | Discharge: 2013-01-21 | Disposition: A | Discharge status: Home / Self Care | Payer: BC Managed Care – PPO | Source: Ambulatory Visit | Attending: Family Medicine | Admitting: Family Medicine

## 2013-01-21 DIAGNOSIS — M25549 Pain in joints of unspecified hand: Secondary | ICD-10-CM | POA: Insufficient documentation

## 2013-01-21 DIAGNOSIS — M79641 Pain in right hand: Secondary | ICD-10-CM

## 2013-01-21 DIAGNOSIS — S6990XA Unspecified injury of unspecified wrist, hand and finger(s), initial encounter: Secondary | ICD-10-CM | POA: Insufficient documentation

## 2013-01-21 DIAGNOSIS — T148XXA Other injury of unspecified body region, initial encounter: Secondary | ICD-10-CM

## 2013-01-21 DIAGNOSIS — S6980XA Other specified injuries of unspecified wrist, hand and finger(s), initial encounter: Secondary | ICD-10-CM | POA: Insufficient documentation

## 2013-01-21 DIAGNOSIS — W230XXA Caught, crushed, jammed, or pinched between moving objects, initial encounter: Secondary | ICD-10-CM | POA: Insufficient documentation

## 2013-02-15 ENCOUNTER — Emergency Department (HOSPITAL_COMMUNITY)
Admission: EM | Admit: 2013-02-15 | Discharge: 2013-02-15 | Disposition: A | Discharge status: Home / Self Care | Payer: BC Managed Care – PPO | Attending: Emergency Medicine | Admitting: Emergency Medicine

## 2013-02-15 ENCOUNTER — Encounter (HOSPITAL_COMMUNITY): Payer: Self-pay | Admitting: Emergency Medicine

## 2013-02-15 DIAGNOSIS — R358 Other polyuria: Secondary | ICD-10-CM | POA: Insufficient documentation

## 2013-02-15 DIAGNOSIS — R1013 Epigastric pain: Secondary | ICD-10-CM | POA: Insufficient documentation

## 2013-02-15 DIAGNOSIS — E119 Type 2 diabetes mellitus without complications: Secondary | ICD-10-CM | POA: Insufficient documentation

## 2013-02-15 DIAGNOSIS — Z794 Long term (current) use of insulin: Secondary | ICD-10-CM | POA: Insufficient documentation

## 2013-02-15 DIAGNOSIS — Z8659 Personal history of other mental and behavioral disorders: Secondary | ICD-10-CM | POA: Insufficient documentation

## 2013-02-15 DIAGNOSIS — R3589 Other polyuria: Secondary | ICD-10-CM | POA: Insufficient documentation

## 2013-02-15 DIAGNOSIS — I10 Essential (primary) hypertension: Secondary | ICD-10-CM | POA: Insufficient documentation

## 2013-02-15 DIAGNOSIS — R000 Tachycardia, unspecified: Secondary | ICD-10-CM | POA: Insufficient documentation

## 2013-02-15 DIAGNOSIS — R112 Nausea with vomiting, unspecified: Secondary | ICD-10-CM | POA: Insufficient documentation

## 2013-02-15 DIAGNOSIS — M549 Dorsalgia, unspecified: Secondary | ICD-10-CM | POA: Insufficient documentation

## 2013-02-15 DIAGNOSIS — R109 Unspecified abdominal pain: Secondary | ICD-10-CM

## 2013-02-15 LAB — CBC
HCT: 48.7 % (ref 39.0–52.0)
Hemoglobin: 17.8 g/dL — ABNORMAL HIGH (ref 13.0–17.0)
MCH: 30.7 pg (ref 26.0–34.0)
MCHC: 36.6 g/dL — ABNORMAL HIGH (ref 30.0–36.0)
MCV: 84 fL (ref 78.0–100.0)
Platelets: 176 10*3/uL (ref 150–400)
RBC: 5.8 MIL/uL (ref 4.22–5.81)
RDW: 12.6 % (ref 11.5–15.5)
WBC: 12.7 10*3/uL — ABNORMAL HIGH (ref 4.0–10.5)

## 2013-02-15 LAB — COMPREHENSIVE METABOLIC PANEL
ALT: 15 U/L (ref 0–53)
AST: 14 U/L (ref 0–37)
Albumin: 4.3 g/dL (ref 3.5–5.2)
Alkaline Phosphatase: 92 U/L (ref 39–117)
BUN: 18 mg/dL (ref 6–23)
CO2: 30 mEq/L (ref 19–32)
Calcium: 9.9 mg/dL (ref 8.4–10.5)
Chloride: 95 mEq/L — ABNORMAL LOW (ref 96–112)
Creatinine, Ser: 0.89 mg/dL (ref 0.50–1.35)
GFR calc Af Amer: >90 mL/min (ref >90)
GFR calc non Af Amer: >90 mL/min (ref >90)
Glucose, Bld: 286 mg/dL — ABNORMAL HIGH (ref 70–99)
Potassium: 4.9 mEq/L (ref 3.7–5.3)
Sodium: 137 mEq/L (ref 137–147)
Total Bilirubin: 1.4 mg/dL — ABNORMAL HIGH (ref 0.3–1.2)
Total Protein: 7.7 g/dL (ref 6.0–8.3)

## 2013-02-15 LAB — URINALYSIS, ROUTINE W REFLEX MICROSCOPIC
Bilirubin Urine: NEGATIVE
Glucose, UA: >1000 mg/dL — AB
Hgb urine dipstick: NEGATIVE
Ketones, ur: NEGATIVE mg/dL
Leukocytes, UA: NEGATIVE
Nitrite: NEGATIVE
Protein, ur: NEGATIVE mg/dL
Specific Gravity, Urine: 1.015 (ref 1.005–1.030)
Urobilinogen, UA: 0.2 mg/dL (ref 0.0–1.0)
pH: 6.5 (ref 5.0–8.0)

## 2013-02-15 LAB — LIPASE, BLOOD: Lipase: 16 U/L (ref 11–59)

## 2013-02-15 MED ORDER — ONDANSETRON HCL 4 MG/2ML IJ SOLN
INTRAMUSCULAR | Status: AC
  Administered 2013-02-15: 4 mg via INTRAVENOUS
  Filled 2013-02-15: qty 2

## 2013-02-15 MED ORDER — MORPHINE SULFATE 4 MG/ML IJ SOLN
4.0000 mg | Freq: Once | INTRAMUSCULAR | Status: AC
  Administered 2013-02-15: 4 mg via INTRAVENOUS
  Filled 2013-02-15: qty 1

## 2013-02-15 MED ORDER — ONDANSETRON 8 MG PO TBDP: 8.0000 mg | ORAL_TABLET | Freq: Three times a day (TID) | ORAL | Status: DC | PRN

## 2013-02-15 MED ORDER — ONDANSETRON HCL 4 MG/2ML IJ SOLN
4.0000 mg | Freq: Once | INTRAMUSCULAR | Status: AC
  Administered 2013-02-15: 4 mg via INTRAVENOUS

## 2013-02-15 MED ORDER — SODIUM CHLORIDE 0.9 % IV BOLUS (SEPSIS)
1000.0000 mL | Freq: Once | INTRAVENOUS | Status: AC
  Administered 2013-02-15: 1000 mL via INTRAVENOUS

## 2013-02-15 MED ORDER — OXYCODONE-ACETAMINOPHEN 5-325 MG PO TABS: 1.0000 | ORAL_TABLET | Freq: Four times a day (QID) | ORAL | Status: DC | PRN

## 2013-02-15 NOTE — Discharge Instructions (Signed)
Abdominal Pain  Abdominal pain can be caused by many things. Your caregiver decides the seriousness of your pain by an examination and possibly blood tests and X-rays. Many cases can be observed and treated at home. Most abdominal pain is not caused by a disease and will probably improve without treatment. However, in many cases, more time must pass before a clear cause of the pain can be found. Before that point, it may not be known if you need more testing, or if hospitalization or surgery is needed.  HOME CARE INSTRUCTIONS    Do not take laxatives unless directed by your caregiver.   Take pain medicine only as directed by your caregiver.   Only take over-the-counter or prescription medicines for pain, discomfort, or fever as directed by your caregiver.   Try a clear liquid diet (broth, tea, or water) for as long as directed by your caregiver. Slowly move to a bland diet as tolerated.  SEEK IMMEDIATE MEDICAL CARE IF:    The pain does not go away.   You have a fever.   You keep throwing up (vomiting).   The pain is felt only in portions of the abdomen. Pain in the right side could possibly be appendicitis. In an adult, pain in the left lower portion of the abdomen could be colitis or diverticulitis.   You pass bloody or black tarry stools.  MAKE SURE YOU:    Understand these instructions.   Will watch your condition.   Will get help right away if you are not doing well or get worse.  Document Released: 11/08/2004 Document Revised: 04/23/2011 Document Reviewed: 09/17/2007  [employer] Patient Information 2014 [employer].    Nausea and Vomiting  Nausea is a sick feeling that often comes before throwing up (vomiting). Vomiting is a reflex where stomach contents come out of your mouth. Vomiting can cause severe loss of body fluids (dehydration). Children and elderly adults can become dehydrated quickly, especially if they also have diarrhea. Nausea and vomiting are symptoms of a condition or disease. It  is important to find the cause of your symptoms.  CAUSES    Direct irritation of the stomach lining. This irritation can result from increased acid production (gastroesophageal reflux disease), infection, food poisoning, taking certain medicines (such as nonsteroidal anti-inflammatory drugs), alcohol use, or tobacco use.   Signals from the brain.These signals could be caused by a headache, heat exposure, an inner ear disturbance, increased pressure in the brain from injury, infection, a tumor, or a concussion, pain, emotional stimulus, or metabolic problems.   An obstruction in the gastrointestinal tract (bowel obstruction).   Illnesses such as diabetes, hepatitis, gallbladder problems, appendicitis, kidney problems, cancer, sepsis, atypical symptoms of a heart attack, or eating disorders.   Medical treatments such as chemotherapy and radiation.   Receiving medicine that makes you sleep (general anesthetic) during surgery.  DIAGNOSIS  Your caregiver may ask for tests to be done if the problems do not improve after a few days. Tests may also be done if symptoms are severe or if the reason for the nausea and vomiting is not clear. Tests may include:   Urine tests.   Blood tests.   Stool tests.   Cultures (to look for evidence of infection).   X-rays or other imaging studies.  Test results can help your caregiver make decisions about treatment or the need for additional tests.  TREATMENT  You need to stay well hydrated. Drink frequently but in small amounts.You may wish to   drink water, sports drinks, clear broth, or eat frozen ice pops or gelatin dessert to help stay hydrated.When you eat, eating slowly may help prevent nausea.There are also some antinausea medicines that may help prevent nausea.  HOME CARE INSTRUCTIONS    Take all medicine as directed by your caregiver.   If you do not have an appetite, do not force yourself to eat. However, you must continue to drink fluids.   If you have an  appetite, eat a normal diet unless your caregiver tells you differently.   Eat a variety of complex carbohydrates (rice, wheat, potatoes, bread), lean meats, yogurt, fruits, and vegetables.   Avoid high-fat foods because they are more difficult to digest.   Drink enough water and fluids to keep your urine clear or pale yellow.   If you are dehydrated, ask your caregiver for specific rehydration instructions. Signs of dehydration may include:   Severe thirst.   Dry lips and mouth.   Dizziness.   Dark urine.   Decreasing urine frequency and amount.   Confusion.   Rapid breathing or pulse.  SEEK IMMEDIATE MEDICAL CARE IF:    You have blood or brown flecks (like coffee grounds) in your vomit.   You have black or bloody stools.   You have a severe headache or stiff neck.   You are confused.   You have severe abdominal pain.   You have chest pain or trouble breathing.   You do not urinate at least once every 8 hours.   You develop cold or clammy skin.   You continue to vomit for longer than 24 to 48 hours.   You have a fever.  MAKE SURE YOU:    Understand these instructions.   Will watch your condition.   Will get help right away if you are not doing well or get worse.  Document Released: 01/29/2005 Document Revised: 04/23/2011 Document Reviewed: 06/28/2010  [employer] Patient Information 2014 [employer].

## 2013-02-15 NOTE — ED Notes (Signed)
Vomiting and abdominal pain began this morning. Pain mostly to upper abdomen. Symptoms began at 0600. Vomited x 7-8 since then . Unable to keep fluids down. Also states pain to mid back.

## 2013-02-15 NOTE — ED Notes (Signed)
Last vomited at 1045

## 2013-02-15 NOTE — ED Provider Notes (Signed)
CSN: [id number]     Arrival date & time 02/15/13  1102 History   First MD Initiated Contact with Patient 02/15/13 1321     Chief Complaint  Patient presents with  . Abdominal Pain  . Emesis   (Consider location/radiation/quality/duration/timing/severity/associated sxs/prior Treatment) Patient is a 28 y.o. male presenting with abdominal pain and vomiting. The history is provided by the patient.  Abdominal Pain Associated symptoms: nausea and vomiting   Associated symptoms: no chest pain, no diarrhea and no shortness of breath   Emesis Associated symptoms: abdominal pain   Associated symptoms: no diarrhea and no headaches    patient woke with mid back pain at around 6 AM this morning. He states he went back to sleep and woke up with upper abdominal pain nausea and vomiting. States he feels fatigued. No diarrhea. He states he threw up yellow and stomach contents. The pain as dull and constant. No fevers. No dysuria. He states it feels like when he had DKA in the past. He does not know her sugar was but states it was around 300 yesterday. He states he is on an insulin pump.  Past Medical History  Diagnosis Date  . Diabetes mellitus   . Hypertension   . Anxiety    Past Surgical History  Procedure Laterality Date  . Knee surgery     Family History  Problem Relation Age of Onset  . Hypertension Mother   . Hypertension Father    History  Substance Use Topics  . Smoking status: Never Smoker   . Smokeless tobacco: Not on file  . Alcohol Use: Yes    Review of Systems  Constitutional: Negative for activity change and appetite change.  Eyes: Negative for pain.  Respiratory: Negative for chest tightness and shortness of breath.   Cardiovascular: Negative for chest pain and leg swelling.  Gastrointestinal: Positive for nausea, vomiting and abdominal pain. Negative for diarrhea.  Endocrine: Positive for polyuria.  Genitourinary: Negative for flank pain.  Musculoskeletal: Positive for  back pain. Negative for neck stiffness.  Skin: Negative for rash.  Neurological: Negative for weakness, numbness and headaches.  Psychiatric/Behavioral: Negative for behavioral problems.    Allergies  Review of patient's allergies indicates not on file.  Home Medications   Current Outpatient Rx  Name  Route  Sig  Dispense  Refill  . Insulin Infusion Pump DEVI   Continuous infusion (non-IV)   by Continuous infusion (non-IV) route continuous. Humalog insulin         . EXPIRED: insulin lispro (HUMALOG) 100 UNIT/ML injection   Subcutaneous   Inject 1-10 Units into the skin 3 (three) times daily before meals.   10 mL   12     CBG LESS THAN 70, CBG 70-120=0 unit 121-150=1 un ...   . ondansetron (ZOFRAN-ODT) 8 MG disintegrating tablet   Oral   Take 1 tablet (8 mg total) by mouth every 8 (eight) hours as needed for nausea or vomiting.   20 tablet   0   . oxyCODONE-acetaminophen (PERCOCET/ROXICET) 5-325 MG per tablet   Oral   Take 1-2 tablets by mouth every 6 (six) hours as needed for severe pain.   10 tablet   0    BP 131/90  Pulse 120  Temp(Src) 98.5 F (36.9 C) (Oral)  Resp 18  SpO2 100% Physical Exam  Nursing note and vitals reviewed. Constitutional: He is oriented to person, place, and time. He appears well-developed and well-nourished.  HENT:  Head: Normocephalic and atraumatic.  Eyes: EOM are normal. Pupils are equal, round, and reactive to light.  Neck: Normal range of motion. Neck supple.  Cardiovascular: Regular rhythm and normal heart sounds.   No murmur heard. Tachycardia  Pulmonary/Chest: Effort normal and breath sounds normal.  Abdominal: Soft. Bowel sounds are normal. He exhibits no distension and no mass. There is tenderness. There is no rebound and no guarding.  Epigastric tenderness without rebound or guarding. No masses palpated  Musculoskeletal: Normal range of motion. He exhibits no edema.  Neurological: He is alert and oriented to person,  place, and time. No cranial nerve deficit.  Skin: Skin is warm and dry.  Psychiatric: He has a normal mood and affect.    ED Course  Procedures (including critical care time) Labs Review Labs Reviewed  CBC - Abnormal; Notable for the following:    WBC 12.7 (*)    Hemoglobin 17.8 (*)    MCHC 36.6 (*)    All other components within normal limits  COMPREHENSIVE METABOLIC PANEL - Abnormal; Notable for the following:    Chloride 95 (*)    Glucose, Bld 286 (*)    Total Bilirubin 1.4 (*)    All other components within normal limits  URINALYSIS, ROUTINE W REFLEX MICROSCOPIC - Abnormal; Notable for the following:    Glucose, UA >1000 (*)    All other components within normal limits  LIPASE, BLOOD  URINE MICROSCOPIC-ADD ON   Imaging Review No results found.  EKG Interpretation   None       MDM   1. Nausea and vomiting   2. Abdominal pain   3. Back pain    Patient with nausea vomiting and upper abdominal and back pain. White count is mildly elevated at 12.7. Hemoglobin is also mildly elevated. Lab results are-wise reassuring. Has a mildly elevated glucose of 280. He is an insulin-dependent diabetic. Patient feels somewhat better as tolerated orals after pain medicine and antiemetics. He will be discharged home. Urinalysis does not show infection or blood.    Juliet RudeNathan R. Rubin PayorPickering, MD 02/15/13 [phone]

## 2013-08-20 ENCOUNTER — Other Ambulatory Visit (HOSPITAL_COMMUNITY): Payer: Self-pay | Admitting: Family Medicine

## 2013-08-20 ENCOUNTER — Ambulatory Visit (HOSPITAL_COMMUNITY)
Admission: RE | Admit: 2013-08-20 | Discharge: 2013-08-20 | Disposition: A | Discharge status: Home / Self Care | Payer: BC Managed Care – PPO | Source: Ambulatory Visit | Attending: Family Medicine | Admitting: Family Medicine

## 2013-08-20 DIAGNOSIS — S93409A Sprain of unspecified ligament of unspecified ankle, initial encounter: Secondary | ICD-10-CM

## 2013-08-20 DIAGNOSIS — S9001XA Contusion of right ankle, initial encounter: Secondary | ICD-10-CM

## 2013-08-20 DIAGNOSIS — M25579 Pain in unspecified ankle and joints of unspecified foot: Secondary | ICD-10-CM | POA: Insufficient documentation

## 2013-08-20 DIAGNOSIS — M7989 Other specified soft tissue disorders: Secondary | ICD-10-CM | POA: Insufficient documentation

## 2014-03-27 ENCOUNTER — Encounter (HOSPITAL_COMMUNITY): Payer: Self-pay | Admitting: *Deleted

## 2014-03-27 ENCOUNTER — Emergency Department (HOSPITAL_COMMUNITY)
Admission: EM | Admit: 2014-03-27 | Discharge: 2014-03-27 | Disposition: A | Discharge status: Home / Self Care | Payer: BLUE CROSS/BLUE SHIELD | Attending: Emergency Medicine | Admitting: Emergency Medicine

## 2014-03-27 DIAGNOSIS — Z8659 Personal history of other mental and behavioral disorders: Secondary | ICD-10-CM | POA: Diagnosis not present

## 2014-03-27 DIAGNOSIS — I10 Essential (primary) hypertension: Secondary | ICD-10-CM | POA: Diagnosis not present

## 2014-03-27 DIAGNOSIS — J029 Acute pharyngitis, unspecified: Secondary | ICD-10-CM | POA: Diagnosis present

## 2014-03-27 DIAGNOSIS — J40 Bronchitis, not specified as acute or chronic: Secondary | ICD-10-CM | POA: Diagnosis not present

## 2014-03-27 DIAGNOSIS — Z794 Long term (current) use of insulin: Secondary | ICD-10-CM | POA: Diagnosis not present

## 2014-03-27 DIAGNOSIS — E119 Type 2 diabetes mellitus without complications: Secondary | ICD-10-CM | POA: Diagnosis not present

## 2014-03-27 MED ORDER — AZITHROMYCIN 250 MG PO TABS: ORAL_TABLET | ORAL | Status: DC

## 2014-03-27 MED ORDER — AZITHROMYCIN 250 MG PO TABS
500.0000 mg | ORAL_TABLET | Freq: Once | ORAL | Status: AC
  Administered 2014-03-27: 500 mg via ORAL
  Filled 2014-03-27: qty 2

## 2014-03-27 NOTE — ED Notes (Signed)
Pt states he woke up Wednesday with chills and shakes. Pt states those went away and now he has a sore throat, cough, chest tightness, and feeling light headed.

## 2014-03-27 NOTE — Discharge Instructions (Signed)

## 2014-03-27 NOTE — ED Provider Notes (Signed)
CSN: [id number]     Arrival date & time 03/27/14  2051 History   First MD Initiated Contact with Patient 03/27/14 2105     Chief Complaint  Patient presents with  . Sore Throat      HPI Pt states he woke up Wednesday with chills and shakes. Pt states those went away and now he has a sore throat, cough, chest tightness, and feeling light headed. Past Medical History  Diagnosis Date  . Diabetes mellitus   . Hypertension   . Anxiety    Past Surgical History  Procedure Laterality Date  . Knee surgery    . Fracture surgery      reconstructive left knee surgery   Family History  Problem Relation Age of Onset  . Hypertension Mother   . Hypertension Father    History  Substance Use Topics  . Smoking status: Never Smoker   . Smokeless tobacco: Not on file  . Alcohol Use: No    Review of Systems  All other systems reviewed and are negative  Allergies  Review of patient's allergies indicates no known allergies.  Home Medications   Prior to Admission medications   Medication Sig Start Date End Date Taking? Authorizing Provider  Insulin Infusion Pump DEVI by Continuous infusion (non-IV) route continuous. Humalog insulin   Yes Historical Provider, MD  Pseudoeph-Doxylamine-DM-APAP (NYQUIL PO) Take 2 capsules by mouth every 4 (four) hours as needed (Cold Symptoms).   Yes Historical Provider, MD  azithromycin (ZITHROMAX) 250 MG tablet Take 1 pill daily until gone 03/27/14   Eric Shi, MD  insulin lispro (HUMALOG) 100 UNIT/ML injection Inject 1-10 Units into the skin 3 (three) times daily before meals. 02/03/11 02/03/12  Hind I Elsaid, MD  ondansetron (ZOFRAN-ODT) 8 MG disintegrating tablet Take 1 tablet (8 mg total) by mouth every 8 (eight) hours as needed for nausea or vomiting. Patient not taking: Reported on 03/27/2014 02/15/13   Eric Rude. Pickering, MD  oxyCODONE-acetaminophen (PERCOCET/ROXICET) 5-325 MG per tablet Take 1-2 tablets by mouth every 6 (six) hours as needed for  severe pain. Patient not taking: Reported on 03/27/2014 02/15/13   Eric Rude. Pickering, MD   BP 140/88 mmHg  Pulse 92  Temp(Src) 98.1 F (36.7 C) (Oral)  Resp 18  Ht  (1.753 m)  Wt 160 lb (72.576 kg)  BMI 23.62 kg/m2  SpO2 97% Physical Exam Physical Exam  Nursing note and vitals reviewed. Constitutional: He is oriented to person, place, and time. He appears well-developed and well-nourished. No distress.  HENT:  Throat: Erythema with no evidence of abscess. Head: Normocephalic and atraumatic.  Eyes: Pupils are equal, round, and reactive to light.  Neck: Normal range of motion.  Cardiovascular: Normal rate and intact distal pulses.   Pulmonary/Chest: No respiratory distress.  some scattered rhonchi in all lung fields Abdominal: Normal appearance. He exhibits no distension.  Musculoskeletal: Normal range of motion.  Neurological: He is alert and oriented to person, place, and time. No cranial nerve deficit.  Skin: Skin is warm and dry. No rash noted.  Psychiatric: He has a normal mood and affect. His behavior is normal.   ED Course  Procedures (including critical care time) Medications  azithromycin (ZITHROMAX) tablet 500 mg (not administered)    Labs Review Labs Reviewed - No data to display  Imaging Review No results found.   EKG Interpretation None      MDM   Final diagnoses:  Bronchitis        Eric [name]  Eric BosL Eric Riecke, MD 03/27/14 2216

## 2014-06-12 ENCOUNTER — Emergency Department (HOSPITAL_COMMUNITY)
Admission: EM | Admit: 2014-06-12 | Discharge: 2014-06-12 | Disposition: A | Discharge status: Home / Self Care | Payer: BLUE CROSS/BLUE SHIELD | Attending: Emergency Medicine | Admitting: Emergency Medicine

## 2014-06-12 ENCOUNTER — Emergency Department (HOSPITAL_COMMUNITY): Payer: BLUE CROSS/BLUE SHIELD

## 2014-06-12 ENCOUNTER — Encounter (HOSPITAL_COMMUNITY): Payer: Self-pay

## 2014-06-12 DIAGNOSIS — Z794 Long term (current) use of insulin: Secondary | ICD-10-CM | POA: Diagnosis not present

## 2014-06-12 DIAGNOSIS — J209 Acute bronchitis, unspecified: Secondary | ICD-10-CM | POA: Insufficient documentation

## 2014-06-12 DIAGNOSIS — E119 Type 2 diabetes mellitus without complications: Secondary | ICD-10-CM | POA: Diagnosis not present

## 2014-06-12 DIAGNOSIS — I10 Essential (primary) hypertension: Secondary | ICD-10-CM | POA: Insufficient documentation

## 2014-06-12 DIAGNOSIS — Z8659 Personal history of other mental and behavioral disorders: Secondary | ICD-10-CM | POA: Diagnosis not present

## 2014-06-12 DIAGNOSIS — R05 Cough: Secondary | ICD-10-CM

## 2014-06-12 DIAGNOSIS — R059 Cough, unspecified: Secondary | ICD-10-CM

## 2014-06-12 MED ORDER — AZITHROMYCIN 250 MG PO TABS: 500.0000 mg | ORAL_TABLET | Freq: Every day | ORAL | Status: DC

## 2014-06-12 MED ORDER — ALBUTEROL SULFATE HFA 108 (90 BASE) MCG/ACT IN AERS
2.0000 | INHALATION_SPRAY | RESPIRATORY_TRACT | Status: DC | PRN
  Administered 2014-06-12: 2 via RESPIRATORY_TRACT
  Filled 2014-06-12: qty 6.7

## 2014-06-12 MED ORDER — HYDROCOD POLST-CPM POLST ER 10-8 MG/5ML PO SUER: 5.0000 mL | Freq: Two times a day (BID) | ORAL | Status: DC | PRN

## 2014-06-12 NOTE — ED Provider Notes (Signed)
CSN: [id number]     Arrival date & time 06/12/14  0051 History   First MD Initiated Contact with Patient 06/12/14 0114     Chief Complaint  Patient presents with  . Cough     (Consider location/radiation/quality/duration/timing/severity/associated sxs/prior Treatment) HPI  This is a 29 year old male insulin-dependent diabetic with a 2 to three-day history of cough, nasal congestion and scratchy throat. His symptoms worsened yesterday afternoon and he has developed fever 200.4, chills, anorexia, general weakness and malaise. He has taken over-the-counter cold medication without significant relief. He has had some occasional wheezing and dyspnea but none presently. Symptoms are moderate.  Past Medical History  Diagnosis Date  . Diabetes mellitus   . Hypertension   . Anxiety    Past Surgical History  Procedure Laterality Date  . Knee surgery    . Fracture surgery      reconstructive left knee surgery   Family History  Problem Relation Age of Onset  . Hypertension Mother   . Hypertension Father    History  Substance Use Topics  . Smoking status: Never Smoker   . Smokeless tobacco: Not on file  . Alcohol Use: No    Review of Systems  All other systems reviewed and are negative.   Allergies  Review of patient's allergies indicates no known allergies.  Home Medications   Prior to Admission medications   Medication Sig Start Date End Date Taking? Authorizing Provider  Insulin Infusion Pump DEVI by Continuous infusion (non-IV) route continuous. Humalog insulin   Yes Historical Provider, MD  insulin lispro (HUMALOG) 100 UNIT/ML injection Inject 1-10 Units into the skin 3 (three) times daily before meals. 02/03/11 06/12/14 Yes Hind I Elsaid, MD  Pseudoeph-Doxylamine-DM-APAP (NYQUIL PO) Take 2 capsules by mouth every 4 (four) hours as needed (Cold Symptoms).   Yes Historical Provider, MD  azithromycin (ZITHROMAX) 250 MG tablet Take 1 pill daily until gone 03/27/14   Nelva Nayobert  Beaton, MD  ondansetron (ZOFRAN-ODT) 8 MG disintegrating tablet Take 1 tablet (8 mg total) by mouth every 8 (eight) hours as needed for nausea or vomiting. Patient not taking: Reported on 03/27/2014 02/15/13   Benjiman CoreNathan Pickering, MD  oxyCODONE-acetaminophen (PERCOCET/ROXICET) 5-325 MG per tablet Take 1-2 tablets by mouth every 6 (six) hours as needed for severe pain. Patient not taking: Reported on 03/27/2014 02/15/13   Benjiman CoreNathan Pickering, MD   BP 137/91 mmHg  Pulse 92  Temp(Src) 99 F (37.2 C) (Oral)  Resp 16  Ht 5\' 9"  (1.753 m)  Wt 175 lb (79.379 kg)  BMI 25.83 kg/m2  SpO2 100%   Physical Exam  General: Well-developed, well-nourished male in no acute distress; appearance consistent with age of record HENT: normocephalic; atraumatic; no pharyngeal edema, erythema or exudate Eyes: pupils equal, round and reactive to light; extraocular muscles intact Neck: supple Heart: regular rate and rhythm Lungs: clear to auscultation bilaterally; coughing on deep breaths Abdomen: soft; nondistended Extremities: No deformity; full range of motion; pulses normal Neurologic: Awake, alert and oriented; motor function intact in all extremities and symmetric; no facial droop Skin: Warm and dry Psychiatric: Normal mood and affect    ED Course  Procedures (including critical care time)   MDM  Nursing notes and vitals signs, including pulse oximetry, reviewed.  Summary of this visit's results, reviewed by myself:  Imaging Studies: Dg Chest 2 View  06/12/2014   CLINICAL DATA:  Initial encounter for cough for 2 months. Nonsmoker.  EXAM: CHEST  2 VIEW  COMPARISON:  None.  FINDINGS:  Midline trachea.  Normal heart size and mediastinal contours.  Sharp costophrenic angles.  No pneumothorax.  Clear lungs.  IMPRESSION: No active cardiopulmonary disease.   Electronically Signed   By: Jeronimo Greaves M.D.   On: 06/12/2014 02:26       Paula Libra, MD 06/12/14 [phone]

## 2014-06-12 NOTE — Discharge Instructions (Signed)

## 2014-06-12 NOTE — ED Notes (Signed)
Pt alert & oriented x4, stable gait. Patient given discharge instructions, paperwork & prescription(s). Patient informed not to drive, operate any equipment & handel any important documents 4 hours after taking pain medication. Patient  instructed to stop at the registration desk to finish any additional paperwork. Patient  verbalized understanding. Pt left department w/ no further questions. 

## 2014-06-12 NOTE — ED Notes (Signed)
Cough, sore throat, decreased energy for past several days.

## 2014-06-15 ENCOUNTER — Encounter (HOSPITAL_COMMUNITY): Payer: Self-pay | Admitting: *Deleted

## 2014-06-15 ENCOUNTER — Emergency Department (HOSPITAL_COMMUNITY)
Admission: EM | Admit: 2014-06-15 | Discharge: 2014-06-16 | Disposition: A | Discharge status: Home / Self Care | Payer: BLUE CROSS/BLUE SHIELD | Attending: Emergency Medicine | Admitting: Emergency Medicine

## 2014-06-15 ENCOUNTER — Emergency Department (HOSPITAL_COMMUNITY): Payer: BLUE CROSS/BLUE SHIELD

## 2014-06-15 DIAGNOSIS — I10 Essential (primary) hypertension: Secondary | ICD-10-CM | POA: Diagnosis not present

## 2014-06-15 DIAGNOSIS — E1165 Type 2 diabetes mellitus with hyperglycemia: Secondary | ICD-10-CM | POA: Diagnosis not present

## 2014-06-15 DIAGNOSIS — Z79899 Other long term (current) drug therapy: Secondary | ICD-10-CM | POA: Insufficient documentation

## 2014-06-15 DIAGNOSIS — J069 Acute upper respiratory infection, unspecified: Secondary | ICD-10-CM | POA: Diagnosis not present

## 2014-06-15 DIAGNOSIS — Z8659 Personal history of other mental and behavioral disorders: Secondary | ICD-10-CM | POA: Diagnosis not present

## 2014-06-15 DIAGNOSIS — R630 Anorexia: Secondary | ICD-10-CM | POA: Diagnosis not present

## 2014-06-15 DIAGNOSIS — J029 Acute pharyngitis, unspecified: Secondary | ICD-10-CM | POA: Diagnosis present

## 2014-06-15 DIAGNOSIS — R739 Hyperglycemia, unspecified: Secondary | ICD-10-CM

## 2014-06-15 DIAGNOSIS — Z794 Long term (current) use of insulin: Secondary | ICD-10-CM | POA: Insufficient documentation

## 2014-06-15 LAB — RAPID STREP SCREEN (MED CTR MEBANE ONLY): Streptococcus, Group A Screen (Direct): NEGATIVE

## 2014-06-15 LAB — CBC WITH DIFFERENTIAL/PLATELET
Basophils Absolute: 0 10*3/uL (ref 0.0–0.1)
Basophils Relative: 0 % (ref 0–1)
Eosinophils Absolute: 0 10*3/uL (ref 0.0–0.7)
Eosinophils Relative: 1 % (ref 0–5)
HCT: 41 % (ref 39.0–52.0)
Hemoglobin: 14.4 g/dL (ref 13.0–17.0)
Lymphocytes Relative: 6 % — ABNORMAL LOW (ref 12–46)
Lymphs Abs: 0.4 10*3/uL — ABNORMAL LOW (ref 0.7–4.0)
MCH: 28.2 pg (ref 26.0–34.0)
MCHC: 35.1 g/dL (ref 30.0–36.0)
MCV: 80.4 fL (ref 78.0–100.0)
Monocytes Absolute: 0.5 10*3/uL (ref 0.1–1.0)
Monocytes Relative: 7 % (ref 3–12)
Neutro Abs: 6.2 10*3/uL (ref 1.7–7.7)
Neutrophils Relative %: 86 % — ABNORMAL HIGH (ref 43–77)
Platelets: 205 10*3/uL (ref 150–400)
RBC: 5.1 MIL/uL (ref 4.22–5.81)
RDW: 11.7 % (ref 11.5–15.5)
WBC: 7.2 10*3/uL (ref 4.0–10.5)

## 2014-06-15 LAB — BASIC METABOLIC PANEL
Anion gap: 7 (ref 5–15)
BUN: 9 mg/dL (ref 6–20)
CO2: 31 mmol/L (ref 22–32)
Calcium: 9.2 mg/dL (ref 8.9–10.3)
Chloride: 98 mmol/L — ABNORMAL LOW (ref 101–111)
Creatinine, Ser: 1.01 mg/dL (ref 0.61–1.24)
GFR calc Af Amer: >60 mL/min (ref >60)
GFR calc non Af Amer: >60 mL/min (ref >60)
Glucose, Bld: 249 mg/dL — ABNORMAL HIGH (ref 70–99)
Potassium: 4.3 mmol/L (ref 3.5–5.1)
Sodium: 136 mmol/L (ref 135–145)

## 2014-06-15 LAB — CBG MONITORING, ED: Glucose-Capillary: 231 mg/dL — ABNORMAL HIGH (ref 70–99)

## 2014-06-15 MED ORDER — SODIUM CHLORIDE 0.9 % IV SOLN
1000.0000 mL | Freq: Once | INTRAVENOUS | Status: AC
  Administered 2014-06-15: 1000 mL via INTRAVENOUS

## 2014-06-15 MED ORDER — SODIUM CHLORIDE 0.9 % IV SOLN: 1000.0000 mL | INTRAVENOUS | Status: DC

## 2014-06-15 MED ORDER — IBUPROFEN 400 MG PO TABS
600.0000 mg | ORAL_TABLET | Freq: Once | ORAL | Status: AC
  Administered 2014-06-15: 600 mg via ORAL
  Filled 2014-06-15: qty 2

## 2014-06-15 MED ORDER — IBUPROFEN 400 MG PO TABS: 400.0000 mg | ORAL_TABLET | Freq: Once | ORAL | Status: DC

## 2014-06-15 NOTE — ED Notes (Signed)
Pt continued dry cough, fever, pt is on insulin pump and states last checked at 1700 today and CBG was 300's, CBG checked here and CBG resulted 231

## 2014-06-15 NOTE — ED Notes (Signed)
Pt reporting burning pain in throat.  States that he was seen and treated recently for bronchitis, but cough has continued and sore throat has gotten worse.

## 2014-06-16 MED ORDER — LORATADINE-PSEUDOEPHEDRINE ER 5-120 MG PO TB12: 1.0000 | ORAL_TABLET | Freq: Two times a day (BID) | ORAL | Status: DC

## 2014-06-16 MED ORDER — HYDROCOD POLST-CPM POLST ER 10-8 MG/5ML PO SUER: 5.0000 mL | Freq: Two times a day (BID) | ORAL | Status: DC | PRN

## 2014-06-16 MED ORDER — HYDROCOD POLST-CPM POLST ER 10-8 MG/5ML PO SUER
5.0000 mL | Freq: Once | ORAL | Status: AC
  Administered 2014-06-16: 5 mL via ORAL
  Filled 2014-06-16: qty 5

## 2014-06-16 MED ORDER — PSEUDOEPHEDRINE HCL 60 MG PO TABS
60.0000 mg | ORAL_TABLET | Freq: Once | ORAL | Status: AC
  Administered 2014-06-16: 60 mg via ORAL
  Filled 2014-06-16: qty 1

## 2014-06-16 NOTE — ED Provider Notes (Signed)
CSN: [id number]     Arrival date & time 06/15/14  2127 History   First MD Initiated Contact with Patient 06/15/14 2318     Chief Complaint  Patient presents with  . Sore Throat     (Consider location/radiation/quality/duration/timing/severity/associated sxs/prior Treatment) HPI Comments: Patient is a 29 year old male who presents to the emergency department with a complaint of sore throat and cough. The patient states that he was seen in the emergency department on April 30 which time he was found to have upper respiratory symptoms include some low-grade temperature changes, nasal congestion, scratchy throat, and generally not feeling well. He was examined, treated with Tussionex and Zithromax. The patient returns today because he says that he continues to cough, and he feels as though his throat is worse instead of better. He also notes that he continues to have temperature elevations that side seemingly difficult to get to resolve. It is of note that the patient is an insulin-dependent diabetic.  Patient is a 29 y.o. male presenting with pharyngitis. The history is provided by the patient.  Sore Throat Associated symptoms include chills, congestion, coughing, a fever and a sore throat. Pertinent negatives include no abdominal pain, arthralgias, chest pain, neck pain or vomiting.    Past Medical History  Diagnosis Date  . Diabetes mellitus   . Hypertension   . Anxiety    Past Surgical History  Procedure Laterality Date  . Knee surgery    . Fracture surgery      reconstructive left knee surgery   Family History  Problem Relation Age of Onset  . Hypertension Mother   . Hypertension Father    History  Substance Use Topics  . Smoking status: Never Smoker   . Smokeless tobacco: Not on file  . Alcohol Use: No    Review of Systems  Constitutional: Positive for fever, chills and appetite change. Negative for activity change.       All ROS Neg except as noted in HPI  HENT: Positive  for congestion, postnasal drip and sore throat. Negative for nosebleeds.   Eyes: Negative for photophobia and discharge.  Respiratory: Positive for cough. Negative for shortness of breath and wheezing.   Cardiovascular: Negative for chest pain and palpitations.  Gastrointestinal: Negative for vomiting, abdominal pain, diarrhea and blood in stool.  Genitourinary: Negative for dysuria, frequency and hematuria.  Musculoskeletal: Negative for back pain, arthralgias and neck pain.  Skin: Negative.   Neurological: Negative for dizziness, seizures and speech difficulty.  Psychiatric/Behavioral: Negative for hallucinations and confusion.      Allergies  Review of patient's allergies indicates no known allergies.  Home Medications   Prior to Admission medications   Medication Sig Start Date End Date Taking? Authorizing Provider  acetaminophen (TYLENOL) 500 MG tablet Take 1,000 mg by mouth every 4 (four) hours as needed for fever.   Yes Historical Provider, MD  albuterol (PROVENTIL HFA;VENTOLIN HFA) 108 (90 BASE) MCG/ACT inhaler Inhale 2 puffs into the lungs every 6 (six) hours as needed for wheezing or shortness of breath.   Yes Historical Provider, MD  Insulin Infusion Pump DEVI by Continuous infusion (non-IV) route continuous. Humalog insulin   Yes Historical Provider, MD  insulin lispro (HUMALOG) 100 UNIT/ML injection Inject 1-10 Units into the skin 3 (three) times daily before meals. 02/03/11  Yes Hind I Elsaid, MD  Pseudoeph-Doxylamine-DM-APAP (NYQUIL PO) Take 2 capsules by mouth every 4 (four) hours as needed (Cold Symptoms).   Yes Historical Provider, MD  azithromycin (ZITHROMAX) 250 MG  tablet Take 2 tablets (500 mg total) by mouth daily. Take 1 pill daily until gone Patient not taking: Reported on 06/15/2014 06/12/14   Paula LibraJohn Molpus, MD  chlorpheniramine-HYDROcodone [facility](TUSSIONEX PENNKINETIC ER) 10-8 MG/5ML SUER Take 5 mLs by mouth every 12 (twelve) hours as needed for cough. Patient not taking:  Reported on 06/15/2014 06/12/14   John Molpus, MD   BP 133/93 mmHg  Pulse 121  Temp(Src) 102.3 F (39.1 C) (Oral)  Resp 16  Ht 5\' 9"  (1.753 m)  Wt 173 lb (78.472 kg)  BMI 25.54 kg/m2  SpO2 99% Physical Exam  Constitutional: He is oriented to person, place, and time. He appears well-developed and well-nourished.  Non-toxic appearance.  HENT:  Head: Normocephalic.  Right Ear: Tympanic membrane and external ear normal.  Left Ear: Tympanic membrane and external ear normal.  Nasal congestion present.  Eyes: EOM and lids are normal. Pupils are equal, round, and reactive to light.  Neck: Normal range of motion. Neck supple. Carotid bruit is not present.  Cardiovascular: Normal rate, regular rhythm, normal heart sounds, intact distal pulses and normal pulses.   Pulmonary/Chest: Breath sounds normal. No respiratory distress. He has no wheezes. He exhibits tenderness.  Abdominal: Soft. Bowel sounds are normal. There is no tenderness. There is no guarding.  Musculoskeletal: Normal range of motion.  Lymphadenopathy:       Head (right side): No submandibular adenopathy present.       Head (left side): No submandibular adenopathy present.    He has no cervical adenopathy.  Neurological: He is alert and oriented to person, place, and time. He has normal strength. No cranial nerve deficit or sensory deficit.  Skin: Skin is warm and dry. No rash noted.  Psychiatric: He has a normal mood and affect. His speech is normal.  Nursing note and vitals reviewed.   ED Course  Procedures (including critical care time) Labs Review Labs Reviewed  BASIC METABOLIC PANEL - Abnormal; Notable for the following:    Chloride 98 (*)    Glucose, Bld 249 (*)    All other components within normal limits  CBC WITH DIFFERENTIAL/PLATELET - Abnormal; Notable for the following:    Neutrophils Relative % 86 (*)    Lymphocytes Relative 6 (*)    Lymphs Abs 0.4 (*)    All other components within normal limits  CBG  MONITORING, ED - Abnormal; Notable for the following:    Glucose-Capillary 231 (*)    All other components within normal limits  RAPID STREP SCREEN  CULTURE, GROUP A STREP  URINALYSIS, ROUTINE W REFLEX MICROSCOPIC    Imaging Review Dg Chest 2 View  06/15/2014   CLINICAL DATA:  Nonproductive cough and fever  EXAM: CHEST  2 VIEW  COMPARISON:  06/12/2014  FINDINGS: The heart size and mediastinal contours are within normal limits. Both lungs are clear. The visualized skeletal structures are unremarkable.  IMPRESSION: No active cardiopulmonary disease.   Electronically Signed   By: Ellery Plunkaniel R Mitchell M.D.   On: 06/15/2014 23:18     EKG Interpretation None      MDM  Temperature upon admission was 102.3, with a pulse rate of 121. Capillary blood glucose was elevated at 231.  The patient was given a liter of IV fluids, and treated with ibuprofen.  Basic metabolic panel shows a glucose to be elevated at 249, otherwise the basic metabolic panels within normal limits. The complete blood count is within normal limits. Rapid strep screen is negative. Chest x-ray shows no active  cardiopulmonary disease.  The patient has recently been treated with Zithromax, and the strep screen is read as negative. Suspect the patient has a viral illness present. The patient is encouraged to increase fluids, use Tylenol every 4 hours, or ibuprofen every 6 hours over the next 3 days. Patient is encouraged to wash hands frequently, Claritin-D is given for congestion, and Tussionex is given for cough. Patient is to follow-up with his primary physician or return to the emergency department if any changes, problems, or concerns.    Final diagnoses:  None    **I have reviewed nursing notes, vital signs, and all appropriate lab and imaging results for this patient.Ivery Quale, PA-C 06/16/14 1610  Devoria Albe, MD 06/16/14 [phone]

## 2014-06-16 NOTE — Discharge Instructions (Signed)
Your glucose was elevated at 249. Please call Dr. Phillips OdorGolding tomorrow, and set up an appointment soon for recheck. You recently treated with Zithromax, and your strep test is negative.  Your chest xray is negative. Upper Respiratory Infection, Adult An upper respiratory infection (URI) is also known as the common cold. It is often caused by a type of germ (virus). Colds are easily spread (contagious). You can pass it to others by kissing, coughing, sneezing, or drinking out of the same glass. Usually, you get better in 1 or 2 weeks.  HOME CARE   Only take medicine as told by your doctor.  Use a warm mist humidifier or breathe in steam from a hot shower.  Drink enough water and fluids to keep your pee (urine) clear or pale yellow.  Get plenty of rest.  Return to work when your temperature is back to normal or as told by your doctor. You may use a face mask and wash your hands to stop your cold from spreading. GET HELP RIGHT AWAY IF:   After the first few days, you feel you are getting worse.  You have questions about your medicine.  You have chills, shortness of breath, or brown or red spit (mucus).  You have yellow or brown snot (nasal discharge) or pain in the face, especially when you bend forward.  You have a fever, puffy (swollen) neck, pain when you swallow, or white spots in the back of your throat.  You have a bad headache, ear pain, sinus pain, or chest pain.  You have a high-pitched whistling sound when you breathe in and out (wheezing).  You have a lasting cough or cough up blood.  You have sore muscles or a stiff neck. MAKE SURE YOU:   Understand these instructions.  Will watch your condition.  Will get help right away if you are not doing well or get worse. Document Released: 07/18/2007 Document Revised: 04/23/2011 Document Reviewed: 05/06/2013 [facility] LP[facility] Patient Information 2015 [employer], [place]. This information is not intended to replace advice given to you by your  health care provider. Make sure you discuss any questions you have with your health care provider. Please use Claritin-D for congestion, please increase fluids, please wash hands frequently. May use Tussionex every 12 hours for cough and congestion if needed. This medication may cause drowsiness, please use with caution. Use tylenol every 4 hours, or ibuprofen every 6 hours for fever for the next 3 days, then use tylenol or ibuprofen as needed.

## 2014-06-19 LAB — CULTURE, GROUP A STREP

## 2015-01-30 ENCOUNTER — Emergency Department (HOSPITAL_COMMUNITY)
Admission: EM | Admit: 2015-01-30 | Discharge: 2015-01-31 | Disposition: A | Discharge status: Home / Self Care | Payer: BLUE CROSS/BLUE SHIELD | Attending: Emergency Medicine | Admitting: Emergency Medicine

## 2015-01-30 ENCOUNTER — Encounter (HOSPITAL_COMMUNITY): Payer: Self-pay | Admitting: *Deleted

## 2015-01-30 DIAGNOSIS — Z794 Long term (current) use of insulin: Secondary | ICD-10-CM | POA: Diagnosis not present

## 2015-01-30 DIAGNOSIS — E119 Type 2 diabetes mellitus without complications: Secondary | ICD-10-CM | POA: Insufficient documentation

## 2015-01-30 DIAGNOSIS — F419 Anxiety disorder, unspecified: Secondary | ICD-10-CM | POA: Insufficient documentation

## 2015-01-30 DIAGNOSIS — I10 Essential (primary) hypertension: Secondary | ICD-10-CM | POA: Diagnosis not present

## 2015-01-30 DIAGNOSIS — R21 Rash and other nonspecific skin eruption: Secondary | ICD-10-CM | POA: Diagnosis present

## 2015-01-30 DIAGNOSIS — B029 Zoster without complications: Secondary | ICD-10-CM | POA: Diagnosis not present

## 2015-01-30 DIAGNOSIS — Z79899 Other long term (current) drug therapy: Secondary | ICD-10-CM | POA: Insufficient documentation

## 2015-01-30 NOTE — ED Notes (Signed)
Pt c/o rash in chest area that radiates around to underarm area that started Wednesday,

## 2015-01-31 MED ORDER — HYDROCODONE-ACETAMINOPHEN 5-325 MG PO TABS: ORAL_TABLET | ORAL | Status: DC

## 2015-01-31 MED ORDER — ACYCLOVIR 800 MG PO TABS: 800.0000 mg | ORAL_TABLET | Freq: Every day | ORAL | Status: DC

## 2015-01-31 MED ORDER — ACYCLOVIR 800 MG PO TABS
800.0000 mg | ORAL_TABLET | Freq: Once | ORAL | Status: AC
  Administered 2015-01-31: 800 mg via ORAL
  Filled 2015-01-31: qty 1

## 2015-01-31 NOTE — ED Provider Notes (Signed)
CSN: [id number]     Arrival date & time 01/30/15  2345 History   First MD Initiated Contact with Patient 01/31/15 0012     Chief Complaint  Patient presents with  . Rash     (Consider location/radiation/quality/duration/timing/severity/associated sxs/prior Treatment) HPI   Eric [name] is a 29 y.o. male with hx of DM, currently on sliding scale insulin, who presents to the Emergency Department complaining of painful rash to the left chest x four days.  Noticed tingling and itching to his chest prior to outbreak of the rash, has localized group of blisters appear to the anterior left chest that has "spread" around his chest to his upper back.  He describes the rash as painful and constantly aching.  Nothing makes the rash better or worse.  He has taken OTC analgesics without relief.  He denies swelling, drainage, recent illness, fever or chills.    Past Medical History  Diagnosis Date  . Diabetes mellitus   . Hypertension   . Anxiety    Past Surgical History  Procedure Laterality Date  . Knee surgery    . Fracture surgery      reconstructive left knee surgery   Family History  Problem Relation Age of Onset  . Hypertension Mother   . Hypertension Father    Social History  Substance Use Topics  . Smoking status: Never Smoker   . Smokeless tobacco: None  . Alcohol Use: No    Review of Systems  Constitutional: Negative for fever, chills, activity change and appetite change.  HENT: Negative for facial swelling, sore throat and trouble swallowing.   Respiratory: Negative for chest tightness, shortness of breath and wheezing.   Musculoskeletal: Negative for neck pain and neck stiffness.  Skin: Positive for rash. Negative for wound.  Neurological: Negative for dizziness, weakness, numbness and headaches.  All other systems reviewed and are negative.     Allergies  Review of patient's allergies indicates no known allergies.  Home Medications   Prior to Admission  medications   Medication Sig Start Date End Date Taking? Authorizing Provider  acetaminophen (TYLENOL) 500 MG tablet Take 1,000 mg by mouth every 4 (four) hours as needed for fever.   Yes Historical Provider, MD  escitalopram (LEXAPRO) 5 MG tablet Take 5 mg by mouth daily.   Yes Historical Provider, MD  insulin lispro (HUMALOG) 100 UNIT/ML injection Inject 1-10 Units into the skin 3 (three) times daily before meals. 02/03/11  Yes Hind I Elsaid, MD  Pseudoeph-Doxylamine-DM-APAP (NYQUIL PO) Take 2 capsules by mouth every 4 (four) hours as needed (Cold Symptoms).   Yes Historical Provider, MD  acyclovir (ZOVIRAX) 800 MG tablet Take 1 tablet (800 mg total) by mouth 5 (five) times daily. For 10 days 01/31/15   Advik Weatherspoon, PA-C  albuterol (PROVENTIL HFA;VENTOLIN HFA) 108 (90 BASE) MCG/ACT inhaler Inhale 2 puffs into the lungs every 6 (six) hours as needed for wheezing or shortness of breath.    Historical Provider, MD  azithromycin (ZITHROMAX) 250 MG tablet Take 2 tablets (500 mg total) by mouth daily. Take 1 pill daily until gone Patient not taking: Reported on 06/15/2014 06/12/14   Paula LibraJohn Molpus, MD  chlorpheniramine-HYDROcodone [facility](TUSSIONEX PENNKINETIC ER) 10-8 MG/5ML SUER Take 5 mLs by mouth every 12 (twelve) hours as needed for cough. 06/16/14   Ivery QualeHobson Bryant, PA-C  HYDROcodone-acetaminophen (NORCO/VICODIN) 5-325 MG tablet Take one-two tabs po q 4-6 hrs prn pain 01/31/15   Ketan Renz, PA-C  HYDROcodone-acetaminophen (NORCO/VICODIN) 5-325 MG tablet Take one-two  tabs po q 4-6 hrs prn pain 01/31/15   Coron Rossano, PA-C  Insulin Infusion Pump DEVI by Continuous infusion (non-IV) route continuous. Humalog insulin    Historical Provider, MD  loratadine-pseudoephedrine (CLARITIN-D 12-HOUR) 5-120 MG per tablet Take 1 tablet by mouth 2 (two) times daily. 06/16/14   Ivery Quale, PA-C   BP 153/98 mmHg  Pulse 92  Temp(Src) 98.3 F (36.8 C) (Oral)  Resp 18  Ht  (1.753 m)  Wt 74.844 kg  BMI 24.36 kg/m2   SpO2 100% Physical Exam  Constitutional: He is oriented to person, place, and time. He appears well-developed and well-nourished. No distress.  HENT:  Head: Normocephalic and atraumatic.  Mouth/Throat: Oropharynx is clear and moist.  Neck: Normal range of motion. Neck supple.  Cardiovascular: Normal rate, regular rhythm, normal heart sounds and intact distal pulses.   No murmur heard. Pulmonary/Chest: Effort normal and breath sounds normal. No respiratory distress.  Abdominal: Soft. There is no tenderness.  Musculoskeletal: Normal range of motion. He exhibits no edema or tenderness.  Lymphadenopathy:    He has no cervical adenopathy.  Neurological: He is alert and oriented to person, place, and time. He exhibits normal muscle tone. Coordination normal.  Skin: Skin is warm. Rash noted. There is erythema.  Erythematous, grouped vesicular rash to the left anterior chest that extends laterally to the midline upper back.  No pustules or edema.    Psychiatric: He has a normal mood and affect.  Nursing note and vitals reviewed.   ED Course  Procedures (including critical care time) Labs Review Labs Reviewed - No data to display  Imaging Review No results found. I have personally reviewed and evaluated these images and lab results as part of my medical decision-making.    MDM   Final diagnoses:  Herpes zoster    Pt is well appearing, non-toxic.  Classic vesicular rash to left chest that appears c/w zoster.  Discussed tx plan with Dr. Elesa Massed, will tx with pain medication and Acyclovir.  Pt appears stable for d/c and agrees to close f/u with PMD.      Pauline Aus, PA-C 01/31/15 0059  Layla Maw Ward, DO 01/31/15 4098

## 2015-01-31 NOTE — Discharge Instructions (Signed)

## 2015-02-01 MED FILL — Hydrocodone-Acetaminophen Tab 5-325 MG: ORAL | Qty: 6 | Status: AC

## 2015-02-05 ENCOUNTER — Encounter (HOSPITAL_COMMUNITY): Payer: Self-pay

## 2015-02-05 ENCOUNTER — Emergency Department (HOSPITAL_COMMUNITY)
Admission: EM | Admit: 2015-02-05 | Discharge: 2015-02-05 | Disposition: A | Discharge status: Home / Self Care | Payer: BLUE CROSS/BLUE SHIELD | Attending: Emergency Medicine | Admitting: Emergency Medicine

## 2015-02-05 DIAGNOSIS — E119 Type 2 diabetes mellitus without complications: Secondary | ICD-10-CM | POA: Diagnosis not present

## 2015-02-05 DIAGNOSIS — Z794 Long term (current) use of insulin: Secondary | ICD-10-CM | POA: Diagnosis not present

## 2015-02-05 DIAGNOSIS — R21 Rash and other nonspecific skin eruption: Secondary | ICD-10-CM | POA: Diagnosis present

## 2015-02-05 DIAGNOSIS — B029 Zoster without complications: Secondary | ICD-10-CM | POA: Diagnosis not present

## 2015-02-05 DIAGNOSIS — Z79899 Other long term (current) drug therapy: Secondary | ICD-10-CM | POA: Diagnosis not present

## 2015-02-05 DIAGNOSIS — I10 Essential (primary) hypertension: Secondary | ICD-10-CM | POA: Diagnosis not present

## 2015-02-05 DIAGNOSIS — F419 Anxiety disorder, unspecified: Secondary | ICD-10-CM | POA: Insufficient documentation

## 2015-02-05 LAB — CBG MONITORING, ED: Glucose-Capillary: 169 mg/dL — ABNORMAL HIGH (ref 65–99)

## 2015-02-05 MED ORDER — GABAPENTIN 300 MG PO CAPS
300.0000 mg | ORAL_CAPSULE | Freq: Once | ORAL | Status: AC
  Administered 2015-02-05: 300 mg via ORAL
  Filled 2015-02-05: qty 1

## 2015-02-05 MED ORDER — GABAPENTIN 300 MG PO CAPS: ORAL_CAPSULE | ORAL | Status: DC

## 2015-02-05 MED ORDER — PREDNISONE 20 MG PO TABS
40.0000 mg | ORAL_TABLET | Freq: Once | ORAL | Status: AC
  Administered 2015-02-05: 40 mg via ORAL
  Filled 2015-02-05: qty 2

## 2015-02-05 MED ORDER — PREDNISONE 20 MG PO TABS: ORAL_TABLET | ORAL | Status: DC

## 2015-02-05 MED ORDER — OXYCODONE-ACETAMINOPHEN 5-325 MG PO TABS
1.0000 | ORAL_TABLET | Freq: Once | ORAL | Status: AC
  Administered 2015-02-05: 1 via ORAL
  Filled 2015-02-05: qty 1

## 2015-02-05 MED ORDER — OXYCODONE-ACETAMINOPHEN 5-325 MG PO TABS: 1.0000 | ORAL_TABLET | Freq: Four times a day (QID) | ORAL | Status: DC | PRN

## 2015-02-05 NOTE — ED Notes (Signed)
Pt complaining of pain & is out of the pain medication prescribed. Rash is dry & scabbed over.

## 2015-02-05 NOTE — Discharge Instructions (Signed)
Start the percocet for pain, but also start the gabapentin and prednisone. BE VERY CAREFUL WITH YOUR DIET WHILE ON THE PREDNISONE BECAUSE IT WILL MAKE YOUR BLOOD SUGAR GET HIGH!! Look at the diabetic diet information. Follow up with Dr Lamar Blinks office this coming week.     Shingles Shingles, which is also known as herpes zoster, is an infection that causes a painful skin rash and fluid-filled blisters. Shingles is not related to genital herpes, which is a sexually transmitted infection.   Shingles only develops in people who:  Have had chickenpox.  Have received the chickenpox vaccine. (This is rare.) CAUSES Shingles is caused by varicella-zoster virus (VZV). This is the same virus that causes chickenpox. After exposure to VZV, the virus stays in the body in an inactive (dormant) state. Shingles develops if the virus reactivates. This can happen many years after the initial exposure to VZV. It is not known what causes this virus to reactivate. RISK FACTORS People who have had chickenpox or received the chickenpox vaccine are at risk for shingles. Infection is more common in people who:  Are older than age 52.  Have a weakened defense (immune) system, such as those with HIV, AIDS, or cancer.  Are taking medicines that weaken the immune system, such as transplant medicines.  Are under great stress. SYMPTOMS Early symptoms of this condition include itching, tingling, and pain in an area on your skin. Pain may be described as burning, stabbing, or throbbing. A few days or weeks after symptoms start, a painful red rash appears, usually on one side of the body in a bandlike or beltlike pattern. The rash eventually turns into fluid-filled blisters that break open, scab over, and dry up in about 2-3 weeks. At any time during the infection, you may also develop:  A fever.  Chills.  A headache.  An upset stomach. DIAGNOSIS This condition is diagnosed with a skin exam. Sometimes, skin or  fluid samples are taken from the blisters before a diagnosis is made. These samples are examined under a microscope or sent to a lab for testing. TREATMENT There is no specific cure for this condition. Your health care provider will probably prescribe medicines to help you manage pain, recover more quickly, and avoid long-term problems. Medicines may include:  Antiviral drugs.  Anti-inflammatory drugs.  Pain medicines. If the area involved is on your face, you may be referred to a specialist, such as an eye doctor (ophthalmologist) or an ear, nose, and throat (ENT) doctor to help you avoid eye problems, chronic pain, or disability. HOME CARE INSTRUCTIONS Medicines  Take medicines only as directed by your health care provider.  Apply an anti-itch or numbing cream to the affected area as directed by your health care provider. Blister and Rash Care  Take a cool bath or apply cool compresses to the area of the rash or blisters as directed by your health care provider. This may help with pain and itching.  Keep your rash covered with a loose bandage (dressing). Wear loose-fitting clothing to help ease the pain of material rubbing against the rash.  Keep your rash and blisters clean with mild soap and cool water or as directed by your health care provider.  Check your rash every day for signs of infection. These include redness, swelling, and pain that lasts or increases.  Do not pick your blisters.  Do not scratch your rash. General Instructions  Rest as directed by your health care provider.  Keep all follow-up visits as  directed by your health care provider. This is important.  Until your blisters scab over, your infection can cause chickenpox in people who have never had it or been vaccinated against it. To prevent this from happening, avoid contact with other people, especially:  Babies.  Pregnant women.  Children who have eczema.  Elderly people who have  transplants.  People who have chronic illnesses, such as leukemia or AIDS. SEEK MEDICAL CARE IF:  Your pain is not relieved with prescribed medicines.  Your pain does not get better after the rash heals.  Your rash looks infected. Signs of infection include redness, swelling, and pain that lasts or increases. SEEK IMMEDIATE MEDICAL CARE IF:  The rash is on your face or nose.  You have facial pain, pain around your eye area, or loss of feeling on one side of your face.  You have ear pain or you have ringing in your ear.  You have loss of taste.  Your condition gets worse.   This information is not intended to replace advice given to you by your health care provider. Make sure you discuss any questions you have with your health care provider.   Document Released: 01/29/2005 Document Revised: 02/19/2014 Document Reviewed: 12/10/2013 Elsevier Interactive Patient Education [employer]2016 [employer] Inc.

## 2015-02-05 NOTE — ED Notes (Signed)
Pt alert & oriented x4, stable gait. Patient given discharge instructions, paperwork & prescription(s). Patient informed not to drive, operate any equipment & handel any important documents 4 hours after taking pain medication. Patient  instructed to stop at the registration desk to finish any additional paperwork. Patient  verbalized understanding. Pt left department w/ no further questions. 

## 2015-02-05 NOTE — ED Provider Notes (Signed)
CSN: [id number]     Arrival date & time 02/05/15  0200 History   First MD Initiated Contact with Patient 02/05/15 0215     Chief Complaint  Patient presents with  . Herpes Zoster     (Consider location/radiation/quality/duration/timing/severity/associated sxs/prior Treatment) HPI patient reports he broke out with a rash on his left back and chest 10 days ago. He states it was painful and he came to the ED on December 19 and was diagnosed with shingles. He was placed on acyclovir which he is still taking and Percocet for pain which he has run out of. He ran out of Percocet 2 days ago. He was unable to get an appointment to see his regular care doctor before the holiday. He states the pain is so bad he cannot stand it. He is not having a fever. He states the rash is drying up.  PCP Dr Phillips Odor  Past Medical History  Diagnosis Date  . Diabetes mellitus   . Hypertension   . Anxiety    Past Surgical History  Procedure Laterality Date  . Knee surgery    . Fracture surgery      reconstructive left knee surgery   Family History  Problem Relation Age of Onset  . Hypertension Mother   . Hypertension Father    Social History  Substance Use Topics  . Smoking status: Never Smoker   . Smokeless tobacco: None  . Alcohol Use: No    Review of Systems  All other systems reviewed and are negative.     Allergies  Review of patient's allergies indicates no known allergies.  Home Medications   Prior to Admission medications   Medication Sig Start Date End Date Taking? Authorizing Provider  acetaminophen (TYLENOL) 500 MG tablet Take 1,000 mg by mouth every 4 (four) hours as needed for fever.   Yes Historical Provider, MD  acyclovir (ZOVIRAX) 800 MG tablet Take 1 tablet (800 mg total) by mouth 5 (five) times daily. For 10 days 01/31/15  Yes Tammy Triplett, PA-C  escitalopram (LEXAPRO) 5 MG tablet Take 5 mg by mouth daily.   Yes Historical Provider, MD  HYDROcodone-acetaminophen  (NORCO/VICODIN) 5-325 MG tablet Take one-two tabs po q 4-6 hrs prn pain 01/31/15  Yes Tammy Triplett, PA-C  Insulin Infusion Pump DEVI by Continuous infusion (non-IV) route continuous. Humalog insulin   Yes Historical Provider, MD  insulin lispro (HUMALOG) 100 UNIT/ML injection Inject 1-10 Units into the skin 3 (three) times daily before meals. 02/03/11  Yes Hind I Elsaid, MD  albuterol (PROVENTIL HFA;VENTOLIN HFA) 108 (90 BASE) MCG/ACT inhaler Inhale 2 puffs into the lungs every 6 (six) hours as needed for wheezing or shortness of breath.    Historical Provider, MD  azithromycin (ZITHROMAX) 250 MG tablet Take 2 tablets (500 mg total) by mouth daily. Take 1 pill daily until gone Patient not taking: Reported on 06/15/2014 06/12/14   Paula Libra, MD  chlorpheniramine-HYDROcodone [facility]) 10-8 MG/5ML SUER Take 5 mLs by mouth every 12 (twelve) hours as needed for cough. 06/16/14   Ivery Quale, PA-C  gabapentin (NEURONTIN) 300 MG capsule Take 1 po BID on December 25, then take 1 po TID daily 02/05/15   Devoria Albe, MD  HYDROcodone-acetaminophen (NORCO/VICODIN) 5-325 MG tablet Take one-two tabs po q 4-6 hrs prn pain 01/31/15   Tammy Triplett, PA-C  loratadine-pseudoephedrine (CLARITIN-D 12-HOUR) 5-120 MG per tablet Take 1 tablet by mouth 2 (two) times daily. 06/16/14   Ivery Quale, PA-C  oxyCODONE-acetaminophen (PERCOCET/ROXICET) [phone]  MG tablet Take 1 tablet by mouth every 6 (six) hours as needed for severe pain. 02/05/15   Devoria AlbeIva Yovan Leeman, MD  predniSONE (DELTASONE) 20 MG tablet Take 2 po QD x 3d then 1 po QD x 3d 02/05/15   Devoria AlbeIva Audryanna Zurita, MD  Pseudoeph-Doxylamine-DM-APAP (NYQUIL PO) Take 2 capsules by mouth every 4 (four) hours as needed (Cold Symptoms).    Historical Provider, MD   BP 154/95 mmHg  Temp(Src) 97.8 F (36.6 C) (Oral)  Resp 18  SpO2 100% Physical Exam  Constitutional: He is oriented to person, place, and time. He appears well-developed and well-nourished.  Non-toxic appearance. He  does not appear ill. No distress.  HENT:  Head: Normocephalic and atraumatic.  Right Ear: External ear normal.  Left Ear: External ear normal.  Nose: Nose normal. No mucosal edema or rhinorrhea.  Mouth/Throat: Mucous membranes are normal. No dental abscesses or uvula swelling.  Eyes: Conjunctivae and EOM are normal.  Neck: Normal range of motion and full passive range of motion without pain.  Pulmonary/Chest: Effort normal. No respiratory distress. He has no rhonchi. He exhibits no crepitus.  Abdominal: Normal appearance.  Musculoskeletal: Normal range of motion. He exhibits no edema or tenderness.  Moves all extremities well.   Neurological: He is alert and oriented to person, place, and time. He has normal strength. No cranial nerve deficit.  Skin: Skin is warm, dry and intact. No rash noted. No erythema. No pallor.  Patient has one small area of a reddened lesion near his thoracic spine, he has several clusters of reddened skin with dried lesions that are around his left nipple approximately the T4 nerve  Psychiatric: He has a normal mood and affect. His speech is normal and behavior is normal. His mood appears not anxious.  Nursing note and vitals reviewed. left back   Left chest     ED Course  Procedures (including critical care time)  Medications  predniSONE (DELTASONE) tablet 40 mg (not administered)  oxyCODONE-acetaminophen (PERCOCET/ROXICET) 5-325 MG per tablet 1 tablet (1 tablet Oral Given 02/05/15 0251)  gabapentin (NEURONTIN) capsule 300 mg (300 mg Oral Given 02/05/15 0251)    Patient was given more Percocet for pain. He was also started on prednisone, his CBG was mildly elevated. He was advised to watch what he eats twice on the prednisone. He also was started on gabapentin for his pain.   Results for orders placed or performed during the hospital encounter of 02/05/15  CBG monitoring, ED  Result Value Ref Range   Glucose-Capillary 169 (H) 65 - 99 mg/dL    Comment 1 Document in Chart    Laboratory interpretation all normal except mild hyperglycemia     MDM   Final diagnoses:  Shingles    New Prescriptions   GABAPENTIN (NEURONTIN) 300 MG CAPSULE    Take 1 po BID on December 25, then take 1 po TID daily   OXYCODONE-ACETAMINOPHEN (PERCOCET/ROXICET) 5-325 MG TABLET    Take 1 tablet by mouth every 6 (six) hours as needed for severe pain.   PREDNISONE (DELTASONE) 20 MG TABLET    Take 2 po QD x 3d then 1 po QD x 3d    Plan discharge  Devoria AlbeIva Giannis Corpuz, MD, Concha PyoFACEP     Mechel Schutter, MD 02/05/15 [phone]

## 2015-02-05 NOTE — ED Notes (Signed)
Pt states the shingles pain is worse and he is out of his vicodin

## 2015-05-19 DIAGNOSIS — E109 Type 1 diabetes mellitus without complications: Secondary | ICD-10-CM | POA: Diagnosis not present

## 2015-06-01 DIAGNOSIS — E0590 Thyrotoxicosis, unspecified without thyrotoxic crisis or storm: Secondary | ICD-10-CM | POA: Diagnosis not present

## 2015-06-01 DIAGNOSIS — E109 Type 1 diabetes mellitus without complications: Secondary | ICD-10-CM | POA: Diagnosis not present

## 2015-06-01 DIAGNOSIS — E1121 Type 2 diabetes mellitus with diabetic nephropathy: Secondary | ICD-10-CM | POA: Diagnosis not present

## 2015-06-01 DIAGNOSIS — Z9641 Presence of insulin pump (external) (internal): Secondary | ICD-10-CM | POA: Diagnosis not present

## 2015-11-04 DIAGNOSIS — E109 Type 1 diabetes mellitus without complications: Secondary | ICD-10-CM | POA: Diagnosis not present

## 2016-01-10 ENCOUNTER — Encounter (HOSPITAL_COMMUNITY): Payer: Self-pay | Admitting: Emergency Medicine

## 2016-01-10 ENCOUNTER — Emergency Department (HOSPITAL_COMMUNITY): Payer: BLUE CROSS/BLUE SHIELD

## 2016-01-10 ENCOUNTER — Emergency Department (HOSPITAL_COMMUNITY)
Admission: EM | Admit: 2016-01-10 | Discharge: 2016-01-11 | Disposition: A | Discharge status: Home / Self Care | Payer: BLUE CROSS/BLUE SHIELD | Attending: Emergency Medicine | Admitting: Emergency Medicine

## 2016-01-10 DIAGNOSIS — J029 Acute pharyngitis, unspecified: Secondary | ICD-10-CM | POA: Insufficient documentation

## 2016-01-10 DIAGNOSIS — R0981 Nasal congestion: Secondary | ICD-10-CM | POA: Insufficient documentation

## 2016-01-10 DIAGNOSIS — R531 Weakness: Secondary | ICD-10-CM | POA: Diagnosis not present

## 2016-01-10 DIAGNOSIS — R0602 Shortness of breath: Secondary | ICD-10-CM | POA: Insufficient documentation

## 2016-01-10 DIAGNOSIS — I10 Essential (primary) hypertension: Secondary | ICD-10-CM | POA: Diagnosis not present

## 2016-01-10 DIAGNOSIS — R197 Diarrhea, unspecified: Secondary | ICD-10-CM | POA: Insufficient documentation

## 2016-01-10 DIAGNOSIS — E109 Type 1 diabetes mellitus without complications: Secondary | ICD-10-CM | POA: Insufficient documentation

## 2016-01-10 DIAGNOSIS — R053 Chronic cough: Secondary | ICD-10-CM

## 2016-01-10 DIAGNOSIS — F17290 Nicotine dependence, other tobacco product, uncomplicated: Secondary | ICD-10-CM | POA: Insufficient documentation

## 2016-01-10 DIAGNOSIS — R05 Cough: Secondary | ICD-10-CM | POA: Diagnosis not present

## 2016-01-10 DIAGNOSIS — Z79899 Other long term (current) drug therapy: Secondary | ICD-10-CM | POA: Diagnosis not present

## 2016-01-10 MED ORDER — HYDROCOD POLST-CPM POLST ER 10-8 MG/5ML PO SUER
5.0000 mL | Freq: Once | ORAL | Status: AC
  Administered 2016-01-10: 5 mL via ORAL
  Filled 2016-01-10: qty 5

## 2016-01-10 MED ORDER — HYDROCOD POLST-CPM POLST ER 10-8 MG/5ML PO SUER: 5.0000 mL | Freq: Two times a day (BID) | ORAL | 0 refills | Status: DC | PRN

## 2016-01-10 MED ORDER — ALBUTEROL SULFATE HFA 108 (90 BASE) MCG/ACT IN AERS
2.0000 | INHALATION_SPRAY | Freq: Once | RESPIRATORY_TRACT | Status: AC
  Administered 2016-01-10: 2 via RESPIRATORY_TRACT
  Filled 2016-01-10: qty 6.7

## 2016-01-10 MED ORDER — BENZONATATE 100 MG PO CAPS
200.0000 mg | ORAL_CAPSULE | Freq: Once | ORAL | Status: AC
  Administered 2016-01-10: 200 mg via ORAL
  Filled 2016-01-10: qty 2

## 2016-01-10 MED ORDER — BENZONATATE 100 MG PO CAPS: 200.0000 mg | ORAL_CAPSULE | Freq: Three times a day (TID) | ORAL | 0 refills | Status: DC | PRN

## 2016-01-10 MED ORDER — CETIRIZINE-PSEUDOEPHEDRINE ER 5-120 MG PO TB12: 1.0000 | ORAL_TABLET | Freq: Two times a day (BID) | ORAL | 0 refills | Status: DC

## 2016-01-10 NOTE — ED Notes (Signed)
Pt states he has had a dry, non productive cough for the past 3 weeks and seems to be getting worse. Denies running any fevers, did have some diarrhea on & off.

## 2016-01-10 NOTE — Discharge Instructions (Signed)
Take the medicines prescribed for your coughing and your sinus symptoms.  Do not drive within 4 hours of taking the tussionex as this will make you drowsy.  You may take 2 puffs of the albuterol every 4 hours if you are coughing or short of breath.  Also, make sure you are drinking plenty of fluids as the sinus medicine (zyrtec) will be drying to your nasal passages and throat.  Cough lozenges can help ease chronic coughing.

## 2016-01-10 NOTE — ED Triage Notes (Signed)
Pt reports dry hacking cough x 3-4 weeks. Pt states the cough makes him feel SOB, weak and dizzy. Pt reports chills.

## 2016-01-12 DIAGNOSIS — E0590 Thyrotoxicosis, unspecified without thyrotoxic crisis or storm: Secondary | ICD-10-CM | POA: Diagnosis not present

## 2016-01-12 DIAGNOSIS — E109 Type 1 diabetes mellitus without complications: Secondary | ICD-10-CM | POA: Diagnosis not present

## 2016-01-12 DIAGNOSIS — E1121 Type 2 diabetes mellitus with diabetic nephropathy: Secondary | ICD-10-CM | POA: Diagnosis not present

## 2016-01-13 NOTE — ED Provider Notes (Signed)
[facility]-EMERGENCY DEPT Provider Note   CSN: [id number] Arrival date & time: 01/10/16  2110     History   Chief Complaint Chief Complaint  Patient presents with  . Cough    HPI Eric [name] is a 30 y.o. male diabetic on an insulin pump (states cbg's are well controlled) presenting with a now 3 week history of dry, nonproductive cough which he has not been able to solve despite treatment with otc cough medications. He initially had uri  type symptoms which included nasal congestion with clear rhinorrhea, sore throat, low grade fever which have since resolved.  Symptoms do not include shortness of breath, chest pain, nausea, vomiting but has had several episodes of diarrhea over the last 2 days. He coughs so hard he at times feels weak and dizzy. He endorses occasional wheeze, mostly notices at night. .  The history is provided by the patient.    Past Medical History:  Diagnosis Date  . Anxiety   . Diabetes mellitus   . Hypertension     Patient Active Problem List   Diagnosis Date Noted  . DKA (diabetic ketoacidoses) (HCC) 02/01/2011  . DM type 1 (diabetes mellitus, type 1) (HCC) 02/01/2011  . Low back pain 02/01/2011  . Depression 02/01/2011  . Elevated blood pressure 02/01/2011    Past Surgical History:  Procedure Laterality Date  . FRACTURE SURGERY     reconstructive left knee surgery  . KNEE SURGERY         Home Medications    Prior to Admission medications   Medication Sig Start Date End Date Taking? Authorizing Provider  albuterol (PROVENTIL HFA;VENTOLIN HFA) 108 (90 BASE) MCG/ACT inhaler Inhale 2 puffs into the lungs every 6 (six) hours as needed for wheezing or shortness of breath.   Yes Historical Provider, MD  Insulin Infusion Pump DEVI by Continuous infusion (non-IV) route continuous. Humalog insulin   Yes Historical Provider, MD  insulin lispro (HUMALOG) 100 UNIT/ML injection Inject 1-10 Units into the skin 3 (three) times daily before meals. Patient  taking differently: Inject 1-10 Units into the skin continuous.  02/03/11  Yes Hind I Elsaid, MD  lisinopril (PRINIVIL,ZESTRIL) 5 MG tablet Take 5 mg by mouth daily.   Yes Historical Provider, MD  Pseudoeph-Doxylamine-DM-APAP (NYQUIL MULTI-SYMPTOM PO) Take 15-30 mLs by mouth daily as needed (for cold symptoms).   Yes Historical Provider, MD  benzonatate (TESSALON) 100 MG capsule Take 2 capsules (200 mg total) by mouth 3 (three) times daily as needed. 01/10/16   Burgess AmorJulie Harve Spradley, PA-C  cetirizine-pseudoephedrine (ZYRTEC-D) 5-120 MG tablet Take 1 tablet by mouth 2 (two) times daily. 01/10/16   Burgess AmorJulie Aliesha Dolata, PA-C  chlorpheniramine-HYDROcodone (TUSSIONEX PENNKINETIC ER) 10-8 MG/5ML SUER Take 5 mLs by mouth every 12 (twelve) hours as needed for cough. 01/10/16   Burgess AmorJulie Jhanvi Drakeford, PA-C    Family History Family History  Problem Relation Age of Onset  . Hypertension Mother   . Hypertension Father     Social History Social History  Substance Use Topics  . Smoking status: Never Smoker  . Smokeless tobacco: Current User    Types: Snuff  . Alcohol use No     Allergies   Patient has no known allergies.   Review of Systems Review of Systems  Constitutional: Negative for chills and fever.  HENT: Positive for congestion and sore throat. Negative for ear pain, rhinorrhea, sinus pressure, trouble swallowing and voice change.   Eyes: Negative for discharge.  Respiratory: Positive for cough and shortness of breath.  Negative for wheezing and stridor.   Cardiovascular: Negative for chest pain.  Gastrointestinal: Positive for diarrhea. Negative for abdominal pain, nausea and vomiting.  Genitourinary: Negative.   Musculoskeletal: Negative.   Skin: Negative.   Neurological: Positive for weakness.     Physical Exam Updated Vital Signs BP 147/89   Pulse 93   Temp 98.1 F (36.7 C) (Oral)   Resp 20   Ht 5\' 9"  (1.753 m)   Wt 76.2 kg   SpO2 98%   BMI 24.81 kg/m   Physical Exam  Constitutional: He is  oriented to person, place, and time. He appears well-developed and well-nourished.  HENT:  Head: Normocephalic and atraumatic.  Right Ear: Tympanic membrane and ear canal normal.  Left Ear: Tympanic membrane and ear canal normal.  Nose: No mucosal edema or rhinorrhea.  Mouth/Throat: Uvula is midline, oropharynx is clear and moist and mucous membranes are normal. No oropharyngeal exudate, posterior oropharyngeal edema, posterior oropharyngeal erythema or tonsillar abscesses.  Eyes: Conjunctivae are normal.  Cardiovascular: Normal rate and normal heart sounds.   No murmur heard. Pulmonary/Chest: Effort normal. No respiratory distress. He has no decreased breath sounds. He has no wheezes. He has no rhonchi. He has no rales.  Frequent dry cough.  Abdominal: Soft. There is no tenderness.  Musculoskeletal: Normal range of motion.  Neurological: He is alert and oriented to person, place, and time.  Skin: Skin is warm and dry. No rash noted.  Psychiatric: He has a normal mood and affect.     ED Treatments / Results  Labs (all labs ordered are listed, but only abnormal results are displayed) Labs Reviewed - No data to display  EKG  EKG Interpretation None       Radiology  Results for orders placed or performed during the hospital encounter of 02/05/15  CBG monitoring, ED  Result Value Ref Range   Glucose-Capillary 169 (H) 65 - 99 mg/dL   Comment 1 Document in Chart    Dg Chest 2 View  Result Date: 01/10/2016 CLINICAL DATA:  30 y/o  M; 3-4 weeks of dry cough. EXAM: CHEST  2 VIEW COMPARISON:  None. FINDINGS: The heart size and mediastinal contours are within normal limits and stable. Both lungs are clear. The visualized skeletal structures are unremarkable. IMPRESSION: No active cardiopulmonary disease. Electronically Signed   By: Mitzi Hansen M.D.   On: 01/10/2016 22:01     Procedures Procedures (including critical care time)  Medications Ordered in ED Medications   benzonatate (TESSALON) capsule 200 mg (200 mg Oral Given 01/10/16 2253)  albuterol (PROVENTIL HFA;VENTOLIN HFA) 108 (90 Base) MCG/ACT inhaler 2 puff (2 puffs Inhalation Given 01/10/16 2341)  chlorpheniramine-HYDROcodone (TUSSIONEX) 10-8 MG/5ML suspension 5 mL (5 mLs Oral Given 01/10/16 2319)     Initial Impression / Assessment and Plan / ED Course  I have reviewed the triage vital signs and the nursing notes.  Pertinent labs & imaging results that were available during my care of the patient were reviewed by me and considered in my medical decision making (see chart for details).  Clinical Course     Lungs CTAB, cxr reviewed and negative for acute process. Normal vital signs - no cp or pleurisy, doubt PE.  He does endorse wheezing although not heard on exam today.  He was given albuterol mdi, tessalon, tussionex for cough. Advised f/u with pcp if sx persist or worsen.  The patient appears reasonably screened and/or stabilized for discharge and I doubt any other medical condition or  other EMC requiring further screening, evaluation, or treatment in the ED at this time prior to discharge.  Final Clinical Impressions(s) / ED Diagnoses   Final diagnoses:  Chronic cough    New Prescriptions Discharge Medication List as of 01/10/2016 11:05 PM    START taking these medications   Details  benzonatate (TESSALON) 100 MG capsule Take 2 capsules (200 mg total) by mouth 3 (three) times daily as needed., Starting Tue 01/10/2016, Print    cetirizine-pseudoephedrine (ZYRTEC-D) 5-120 MG tablet Take 1 tablet by mouth 2 (two) times daily., Starting Tue 01/10/2016, Print    chlorpheniramine-HYDROcodone (TUSSIONEX PENNKINETIC ER) 10-8 MG/5ML SUER Take 5 mLs by mouth every 12 (twelve) hours as needed for cough., Starting Tue 01/10/2016, Print         Burgess AmorJulie Martrell Eguia, PA-C 01/14/16 [id number]    Margarita Grizzleanielle Ray, MD 01/16/16 [id number]

## 2016-01-16 DIAGNOSIS — E0590 Thyrotoxicosis, unspecified without thyrotoxic crisis or storm: Secondary | ICD-10-CM | POA: Diagnosis not present

## 2016-01-16 DIAGNOSIS — Z9641 Presence of insulin pump (external) (internal): Secondary | ICD-10-CM | POA: Diagnosis not present

## 2016-01-16 DIAGNOSIS — E1121 Type 2 diabetes mellitus with diabetic nephropathy: Secondary | ICD-10-CM | POA: Diagnosis not present

## 2016-01-16 DIAGNOSIS — E109 Type 1 diabetes mellitus without complications: Secondary | ICD-10-CM | POA: Diagnosis not present

## 2016-04-03 DIAGNOSIS — E109 Type 1 diabetes mellitus without complications: Secondary | ICD-10-CM | POA: Diagnosis not present

## 2016-04-09 DIAGNOSIS — Z6824 Body mass index (BMI) 24.0-24.9, adult: Secondary | ICD-10-CM | POA: Diagnosis not present

## 2016-04-09 DIAGNOSIS — E109 Type 1 diabetes mellitus without complications: Secondary | ICD-10-CM | POA: Diagnosis not present

## 2016-04-09 DIAGNOSIS — N529 Male erectile dysfunction, unspecified: Secondary | ICD-10-CM | POA: Diagnosis not present

## 2016-04-09 DIAGNOSIS — Z1389 Encounter for screening for other disorder: Secondary | ICD-10-CM | POA: Diagnosis not present

## 2016-04-09 DIAGNOSIS — R37 Sexual dysfunction, unspecified: Secondary | ICD-10-CM | POA: Diagnosis not present

## 2016-07-04 DIAGNOSIS — E109 Type 1 diabetes mellitus without complications: Secondary | ICD-10-CM | POA: Diagnosis not present

## 2016-09-17 DIAGNOSIS — E109 Type 1 diabetes mellitus without complications: Secondary | ICD-10-CM | POA: Diagnosis not present

## 2016-10-22 DIAGNOSIS — E109 Type 1 diabetes mellitus without complications: Secondary | ICD-10-CM | POA: Diagnosis not present

## 2016-10-31 DIAGNOSIS — Z9641 Presence of insulin pump (external) (internal): Secondary | ICD-10-CM | POA: Diagnosis not present

## 2016-10-31 DIAGNOSIS — E0590 Thyrotoxicosis, unspecified without thyrotoxic crisis or storm: Secondary | ICD-10-CM | POA: Diagnosis not present

## 2016-10-31 DIAGNOSIS — E1121 Type 2 diabetes mellitus with diabetic nephropathy: Secondary | ICD-10-CM | POA: Diagnosis not present

## 2016-10-31 DIAGNOSIS — Z23 Encounter for immunization: Secondary | ICD-10-CM | POA: Diagnosis not present

## 2016-10-31 DIAGNOSIS — E109 Type 1 diabetes mellitus without complications: Secondary | ICD-10-CM | POA: Diagnosis not present

## 2017-03-18 DIAGNOSIS — E0590 Thyrotoxicosis, unspecified without thyrotoxic crisis or storm: Secondary | ICD-10-CM | POA: Diagnosis not present

## 2017-03-18 DIAGNOSIS — E109 Type 1 diabetes mellitus without complications: Secondary | ICD-10-CM | POA: Diagnosis not present

## 2017-03-18 DIAGNOSIS — E1121 Type 2 diabetes mellitus with diabetic nephropathy: Secondary | ICD-10-CM | POA: Diagnosis not present

## 2017-03-30 ENCOUNTER — Emergency Department (HOSPITAL_COMMUNITY): Payer: BLUE CROSS/BLUE SHIELD

## 2017-03-30 ENCOUNTER — Other Ambulatory Visit: Payer: Self-pay

## 2017-03-30 ENCOUNTER — Encounter (HOSPITAL_COMMUNITY): Payer: Self-pay | Admitting: Emergency Medicine

## 2017-03-30 ENCOUNTER — Observation Stay (HOSPITAL_COMMUNITY)
Admission: EM | Admit: 2017-03-30 | Discharge: 2017-03-31 | Disposition: A | Discharge status: Home / Self Care | Payer: BLUE CROSS/BLUE SHIELD | Attending: Family Medicine | Admitting: Family Medicine

## 2017-03-30 DIAGNOSIS — F329 Major depressive disorder, single episode, unspecified: Secondary | ICD-10-CM | POA: Diagnosis not present

## 2017-03-30 DIAGNOSIS — R1110 Vomiting, unspecified: Secondary | ICD-10-CM | POA: Diagnosis not present

## 2017-03-30 DIAGNOSIS — E860 Dehydration: Secondary | ICD-10-CM | POA: Diagnosis not present

## 2017-03-30 DIAGNOSIS — F419 Anxiety disorder, unspecified: Secondary | ICD-10-CM | POA: Insufficient documentation

## 2017-03-30 DIAGNOSIS — A084 Viral intestinal infection, unspecified: Secondary | ICD-10-CM | POA: Diagnosis present

## 2017-03-30 DIAGNOSIS — E109 Type 1 diabetes mellitus without complications: Secondary | ICD-10-CM | POA: Diagnosis present

## 2017-03-30 DIAGNOSIS — R000 Tachycardia, unspecified: Secondary | ICD-10-CM | POA: Diagnosis present

## 2017-03-30 DIAGNOSIS — R197 Diarrhea, unspecified: Secondary | ICD-10-CM | POA: Diagnosis not present

## 2017-03-30 DIAGNOSIS — M546 Pain in thoracic spine: Secondary | ICD-10-CM | POA: Diagnosis not present

## 2017-03-30 DIAGNOSIS — E869 Volume depletion, unspecified: Secondary | ICD-10-CM | POA: Insufficient documentation

## 2017-03-30 DIAGNOSIS — Z794 Long term (current) use of insulin: Secondary | ICD-10-CM | POA: Diagnosis not present

## 2017-03-30 DIAGNOSIS — I10 Essential (primary) hypertension: Secondary | ICD-10-CM | POA: Insufficient documentation

## 2017-03-30 DIAGNOSIS — A0839 Other viral enteritis: Secondary | ICD-10-CM | POA: Diagnosis not present

## 2017-03-30 DIAGNOSIS — R509 Fever, unspecified: Secondary | ICD-10-CM | POA: Diagnosis present

## 2017-03-30 DIAGNOSIS — Z79899 Other long term (current) drug therapy: Secondary | ICD-10-CM | POA: Diagnosis not present

## 2017-03-30 DIAGNOSIS — E119 Type 2 diabetes mellitus without complications: Principal | ICD-10-CM | POA: Insufficient documentation

## 2017-03-30 DIAGNOSIS — R112 Nausea with vomiting, unspecified: Secondary | ICD-10-CM | POA: Diagnosis not present

## 2017-03-30 DIAGNOSIS — IMO0001 Reserved for inherently not codable concepts without codable children: Secondary | ICD-10-CM

## 2017-03-30 LAB — COMPREHENSIVE METABOLIC PANEL
ALT: 26 U/L (ref 17–63)
AST: 20 U/L (ref 15–41)
Albumin: 4.5 g/dL (ref 3.5–5.0)
Alkaline Phosphatase: 103 U/L (ref 38–126)
Anion gap: 16 — ABNORMAL HIGH (ref 5–15)
BUN: 17 mg/dL (ref 6–20)
CO2: 23 mmol/L (ref 22–32)
Calcium: 9.5 mg/dL (ref 8.9–10.3)
Chloride: 96 mmol/L — ABNORMAL LOW (ref 101–111)
Creatinine, Ser: 0.85 mg/dL (ref 0.61–1.24)
GFR calc Af Amer: >60 mL/min (ref >60)
GFR calc non Af Amer: >60 mL/min (ref >60)
Glucose, Bld: 271 mg/dL — ABNORMAL HIGH (ref 65–99)
Potassium: 4.2 mmol/L (ref 3.5–5.1)
Sodium: 135 mmol/L (ref 135–145)
Total Bilirubin: 1.4 mg/dL — ABNORMAL HIGH (ref 0.3–1.2)
Total Protein: 7.8 g/dL (ref 6.5–8.1)

## 2017-03-30 LAB — BLOOD GAS, VENOUS
Acid-Base Excess: 3 mmol/L — ABNORMAL HIGH (ref 0.0–2.0)
Bicarbonate: 24.6 mmol/L (ref 20.0–28.0)
FIO2: 21
O2 Saturation: 43.3 %
pCO2, Ven: 51.7 mmHg (ref 44.0–60.0)
pH, Ven: 7.355 (ref 7.250–7.430)

## 2017-03-30 LAB — I-STAT CHEM 8, ED
BUN: 17 mg/dL (ref 6–20)
Calcium, Ion: 1.07 mmol/L — ABNORMAL LOW (ref 1.15–1.40)
Chloride: 99 mmol/L — ABNORMAL LOW (ref 101–111)
Creatinine, Ser: 0.7 mg/dL (ref 0.61–1.24)
Glucose, Bld: 280 mg/dL — ABNORMAL HIGH (ref 65–99)
HCT: 52 % (ref 39.0–52.0)
Hemoglobin: 17.7 g/dL — ABNORMAL HIGH (ref 13.0–17.0)
Potassium: 4.2 mmol/L (ref 3.5–5.1)
Sodium: 137 mmol/L (ref 135–145)
TCO2: 27 mmol/L (ref 22–32)

## 2017-03-30 LAB — URINALYSIS, ROUTINE W REFLEX MICROSCOPIC
Bacteria, UA: NONE SEEN
Bilirubin Urine: NEGATIVE
Glucose, UA: >=500 mg/dL — AB
Hgb urine dipstick: NEGATIVE
Ketones, ur: NEGATIVE mg/dL
Leukocytes, UA: NEGATIVE
Nitrite: NEGATIVE
Protein, ur: 100 mg/dL — AB
Specific Gravity, Urine: 1.026 (ref 1.005–1.030)
Squamous Epithelial / LPF: NONE SEEN
pH: 6 (ref 5.0–8.0)

## 2017-03-30 LAB — GLUCOSE, CAPILLARY
Glucose-Capillary: 90 mg/dL (ref 65–99)
Glucose-Capillary: 123 mg/dL — ABNORMAL HIGH (ref 65–99)

## 2017-03-30 LAB — CBC
HCT: 49.1 % (ref 39.0–52.0)
Hemoglobin: 17.4 g/dL — ABNORMAL HIGH (ref 13.0–17.0)
MCH: 30 pg (ref 26.0–34.0)
MCHC: 35.4 g/dL (ref 30.0–36.0)
MCV: 84.7 fL (ref 78.0–100.0)
Platelets: 197 10*3/uL (ref 150–400)
RBC: 5.8 MIL/uL (ref 4.22–5.81)
RDW: 12.3 % (ref 11.5–15.5)
WBC: 16.7 10*3/uL — ABNORMAL HIGH (ref 4.0–10.5)

## 2017-03-30 LAB — BASIC METABOLIC PANEL
Anion gap: 11 (ref 5–15)
Anion gap: 12 (ref 5–15)
BUN: 17 mg/dL (ref 6–20)
BUN: 16 mg/dL (ref 6–20)
CO2: 28 mmol/L (ref 22–32)
CO2: 27 mmol/L (ref 22–32)
Calcium: 8.6 mg/dL — ABNORMAL LOW (ref 8.9–10.3)
Calcium: 8.4 mg/dL — ABNORMAL LOW (ref 8.9–10.3)
Chloride: 99 mmol/L — ABNORMAL LOW (ref 101–111)
Chloride: 100 mmol/L — ABNORMAL LOW (ref 101–111)
Creatinine, Ser: 0.96 mg/dL (ref 0.61–1.24)
Creatinine, Ser: 0.9 mg/dL (ref 0.61–1.24)
GFR calc Af Amer: >60 mL/min (ref >60)
GFR calc Af Amer: >60 mL/min (ref >60)
GFR calc non Af Amer: >60 mL/min (ref >60)
GFR calc non Af Amer: >60 mL/min (ref >60)
Glucose, Bld: 204 mg/dL — ABNORMAL HIGH (ref 65–99)
Glucose, Bld: 135 mg/dL — ABNORMAL HIGH (ref 65–99)
Potassium: 4.2 mmol/L (ref 3.5–5.1)
Potassium: 4.2 mmol/L (ref 3.5–5.1)
Sodium: 138 mmol/L (ref 135–145)
Sodium: 139 mmol/L (ref 135–145)

## 2017-03-30 LAB — RAPID URINE DRUG SCREEN, HOSP PERFORMED
Amphetamines: NOT DETECTED
Barbiturates: NOT DETECTED
Benzodiazepines: NOT DETECTED
Cocaine: NOT DETECTED
Opiates: NOT DETECTED
Tetrahydrocannabinol: NOT DETECTED

## 2017-03-30 LAB — INFLUENZA PANEL BY PCR (TYPE A & B)
Influenza A By PCR: NEGATIVE
Influenza B By PCR: NEGATIVE

## 2017-03-30 LAB — HEMOGLOBIN A1C
Hgb A1c MFr Bld: 8.2 % — ABNORMAL HIGH (ref 4.8–5.6)
Mean Plasma Glucose: 188.64 mg/dL

## 2017-03-30 LAB — LIPASE, BLOOD: Lipase: 18 U/L (ref 11–51)

## 2017-03-30 LAB — TROPONIN I: Troponin I: <0.03 ng/mL (ref <0.03)

## 2017-03-30 LAB — CBG MONITORING, ED
Glucose-Capillary: 278 mg/dL — ABNORMAL HIGH (ref 65–99)
Glucose-Capillary: 130 mg/dL — ABNORMAL HIGH (ref 65–99)
Glucose-Capillary: 143 mg/dL — ABNORMAL HIGH (ref 65–99)

## 2017-03-30 MED ORDER — SODIUM CHLORIDE 0.9 % IV SOLN
INTRAVENOUS | Status: DC
  Administered 2017-03-30: 2.2 [IU]/h via INTRAVENOUS
  Filled 2017-03-30: qty 1

## 2017-03-30 MED ORDER — HYDRALAZINE HCL 20 MG/ML IJ SOLN: 5.0000 mg | INTRAMUSCULAR | Status: DC | PRN

## 2017-03-30 MED ORDER — INSULIN ASPART 100 UNIT/ML ~~LOC~~ SOLN
0.0000 [IU] | Freq: Three times a day (TID) | SUBCUTANEOUS | Status: DC
  Administered 2017-03-31: 3 [IU] via SUBCUTANEOUS
  Administered 2017-03-31: 1 [IU] via SUBCUTANEOUS

## 2017-03-30 MED ORDER — SODIUM CHLORIDE 0.9 % IV SOLN
INTRAVENOUS | Status: DC
  Administered 2017-03-30 – 2017-03-31 (×3): via INTRAVENOUS

## 2017-03-30 MED ORDER — SALINE SPRAY 0.65 % NA SOLN
1.0000 | NASAL | Status: DC | PRN
  Administered 2017-03-30: 1 via NASAL
  Filled 2017-03-30: qty 44

## 2017-03-30 MED ORDER — SODIUM CHLORIDE 0.9 % IV BOLUS (SEPSIS)
1000.0000 mL | Freq: Once | INTRAVENOUS | Status: AC
  Administered 2017-03-30: 1000 mL via INTRAVENOUS

## 2017-03-30 MED ORDER — ENOXAPARIN SODIUM 40 MG/0.4ML ~~LOC~~ SOLN
40.0000 mg | SUBCUTANEOUS | Status: DC
  Administered 2017-03-30: 40 mg via SUBCUTANEOUS
  Filled 2017-03-30: qty 0.4

## 2017-03-30 MED ORDER — INSULIN ASPART 100 UNIT/ML ~~LOC~~ SOLN: 5.0000 [IU] | Freq: Three times a day (TID) | SUBCUTANEOUS | Status: DC

## 2017-03-30 MED ORDER — ACETAMINOPHEN 325 MG PO TABS
650.0000 mg | ORAL_TABLET | Freq: Four times a day (QID) | ORAL | Status: DC | PRN
  Administered 2017-03-30 – 2017-03-31 (×4): 650 mg via ORAL
  Filled 2017-03-30 (×4): qty 2

## 2017-03-30 MED ORDER — DEXTROSE-NACL 5-0.45 % IV SOLN: INTRAVENOUS | Status: DC

## 2017-03-30 MED ORDER — ACETAMINOPHEN 325 MG PO TABS
650.0000 mg | ORAL_TABLET | Freq: Once | ORAL | Status: AC
  Administered 2017-03-30: 650 mg via ORAL
  Filled 2017-03-30: qty 2

## 2017-03-30 MED ORDER — ONDANSETRON HCL 4 MG/2ML IJ SOLN
4.0000 mg | Freq: Once | INTRAMUSCULAR | Status: AC
  Administered 2017-03-30: 4 mg via INTRAVENOUS
  Filled 2017-03-30: qty 2

## 2017-03-30 MED ORDER — SODIUM CHLORIDE 0.9 % IV BOLUS (SEPSIS)
500.0000 mL | Freq: Once | INTRAVENOUS | Status: AC
  Administered 2017-03-30: 500 mL via INTRAVENOUS

## 2017-03-30 MED ORDER — SODIUM CHLORIDE 0.9 % IV SOLN
Freq: Once | INTRAVENOUS | Status: AC
  Administered 2017-03-30: 09:00:00 via INTRAVENOUS

## 2017-03-30 MED ORDER — PROMETHAZINE HCL 12.5 MG PO TABS
12.5000 mg | ORAL_TABLET | Freq: Four times a day (QID) | ORAL | Status: DC | PRN
  Administered 2017-03-31: 12.5 mg via ORAL
  Filled 2017-03-30: qty 1

## 2017-03-30 MED ORDER — MORPHINE SULFATE (PF) 4 MG/ML IV SOLN
4.0000 mg | Freq: Once | INTRAVENOUS | Status: AC
  Administered 2017-03-30: 4 mg via INTRAVENOUS
  Filled 2017-03-30: qty 1

## 2017-03-30 MED ORDER — POTASSIUM CHLORIDE 10 MEQ/100ML IV SOLN
10.0000 meq | INTRAVENOUS | Status: AC
  Administered 2017-03-30: 10 meq via INTRAVENOUS
  Filled 2017-03-30 (×2): qty 100

## 2017-03-30 MED ORDER — LORATADINE 10 MG PO TABS
10.0000 mg | ORAL_TABLET | Freq: Every day | ORAL | Status: DC | PRN
  Administered 2017-03-30: 10 mg via ORAL
  Filled 2017-03-30: qty 1

## 2017-03-30 MED ORDER — SODIUM CHLORIDE 0.9 % IV SOLN
INTRAVENOUS | Status: DC
  Administered 2017-03-30: 13:00:00 via INTRAVENOUS

## 2017-03-30 MED ORDER — INSULIN GLARGINE 100 UNIT/ML ~~LOC~~ SOLN
30.0000 [IU] | Freq: Every day | SUBCUTANEOUS | Status: DC
  Administered 2017-03-31: 30 [IU] via SUBCUTANEOUS
  Filled 2017-03-30 (×2): qty 0.3

## 2017-03-30 MED ORDER — IBUPROFEN 600 MG PO TABS
600.0000 mg | ORAL_TABLET | Freq: Once | ORAL | Status: AC
  Administered 2017-03-30: 600 mg via ORAL
  Filled 2017-03-30: qty 1

## 2017-03-30 MED ORDER — ACETAMINOPHEN 650 MG RE SUPP: 650.0000 mg | Freq: Four times a day (QID) | RECTAL | Status: DC | PRN

## 2017-03-30 MED ORDER — ALBUTEROL SULFATE (2.5 MG/3ML) 0.083% IN NEBU: 3.0000 mL | INHALATION_SOLUTION | Freq: Four times a day (QID) | RESPIRATORY_TRACT | Status: DC | PRN

## 2017-03-30 NOTE — H&P (Signed)
History and Physical  Eric [name] ZOX:[id number] DOB: Jul 18, 1985 DOA: 03/30/2017  Referring physician: Rosalia [name] PCP: Eric Found, MD   Chief Complaint: vomiting and malaise  HPI: Eric [name] is a 32 y.o. male with type 1 diabetes mellitus and hypertension who apparently recently started using insulin injections after he could no longer afford the supplies associated with his insulin pump reported that at around 3 AM he developed emesis and malaise and had a difficult time keeping down fluids.  He also had one episode of diarrhea.  He has had some hot and cold chills and body aches.  He has several family members at home that have been diagnosed with flulike illnesses.  He reports that he has been taking his insulin.  He took 30 units of Lantus this morning at 8 AM which he normally does.  He was seen in the emergency department and noted to be dehydrated with sinus tachycardia.  He was afebrile.  He was orthostatically positive.  In addition, he had a mildly elevated blood glucose but a normal anion gap.  He had no ketones in his urine.  He did not have DKA.  He did receive some IV insulin in the ED but that has been stopped at this time.  He was given IV fluid hydration and admission was requested for further treatment.  The patient denies dysuria and chest pain.  The patient denies shortness of breath.  The patient's chest x-ray had no acute findings.  Review of Systems: All systems reviewed and apart from history of presenting illness, are negative.  Past Medical History:  Diagnosis Date  . Anxiety   . Diabetes mellitus   . Hypertension    Past Surgical History:  Procedure Laterality Date  . FRACTURE SURGERY     reconstructive left knee surgery  . KNEE SURGERY     Social History:  reports that  has never smoked. His smokeless tobacco use includes snuff. He reports that he drinks alcohol. He reports that he does not use drugs.  No Known Allergies  Family History  Problem  Relation Age of Onset  . Hypertension Mother   . Hypertension Father     Prior to Admission medications   Medication Sig Start Date End Date Taking? Authorizing Provider  insulin glargine (LANTUS) 100 UNIT/ML injection Inject 30 Units into the skin daily.   Yes [provider]  Insulin Infusion Pump DEVI by Continuous infusion (non-IV) route continuous. Humalog insulin   Yes [provider]  insulin lispro (HUMALOG) 100 UNIT/ML injection Inject 1-10 Units into the skin 3 (three) times daily before meals. Patient taking differently: Inject 1-10 Units into the skin continuous.  02/03/11  Yes Elsaid, Hind I, MD  lisinopril (PRINIVIL,ZESTRIL) 10 MG tablet Take 10 mg by mouth daily.    Yes [provider]  albuterol (PROVENTIL HFA;VENTOLIN HFA) 108 (90 BASE) MCG/ACT inhaler Inhale 2 puffs into the lungs every 6 (six) hours as needed for wheezing or shortness of breath.    [provider]   Physical Exam: Vitals:   03/30/17 1300 03/30/17 1359 03/30/17 1400 03/30/17 1508  BP: 126/75 126/75 127/71 111/71  Pulse: (!) 125 (!) 127 (!) 121 72  Resp: (!) 22 18 14 18   Temp:      TempSrc:      SpO2: 96% 98% 97% 99%  Weight:      Height:         General exam: Moderately built and nourished patient, lying  comfortably supine on the gurney in no obvious distress.  Head, eyes and ENT: Nontraumatic and normocephalic. Pupils equally reacting to light and accommodation. Oral mucosa dry.  Neck: Supple. No JVD, carotid bruit or thyromegaly.  Lymphatics: No lymphadenopathy.  Respiratory system: Clear to auscultation. No increased work of breathing.  Cardiovascular system: S1 and S2 heard, tachycardic. No JVD, murmurs, gallops, clicks or pedal edema.  Gastrointestinal system: Abdomen is nondistended, soft and nontender. Normal bowel sounds heard. No organomegaly or masses appreciated.  Central nervous system: Alert and oriented. No focal neurological  deficits.  Extremities: Symmetric 5 x 5 power. Peripheral pulses symmetrically felt.   Skin: No rashes or acute findings.  Musculoskeletal system: Negative exam.  Psychiatry: Pleasant and cooperative.  Labs on Admission:  Basic Metabolic Panel: Recent Labs  Lab 03/30/17 0923 03/30/17 0935 03/30/17 1158 03/30/17 1359  NA 135 137 138 139  K 4.2 4.2 4.2 4.2  CL 96* 99* 99* 100*  CO2 23  --  28 27  GLUCOSE 271* 280* 204* 135*  BUN 17 17 17 16   CREATININE 0.85 0.70 0.96 0.90  CALCIUM 9.5  --  8.6* 8.4*   Liver Function Tests: Recent Labs  Lab 03/30/17 0923  AST 20  ALT 26  ALKPHOS 103  BILITOT 1.4*  PROT 7.8  ALBUMIN 4.5   Recent Labs  Lab 03/30/17 0923  LIPASE 18   No results for input(s): AMMONIA in the last 168 hours. CBC: Recent Labs  Lab 03/30/17 0923 03/30/17 0935  WBC 16.7*  --   HGB 17.4* 17.7*  HCT 49.1 52.0  MCV 84.7  --   PLT 197  --    Cardiac Enzymes: Recent Labs  Lab 03/30/17 0923  TROPONINI <0.03    BNP (last 3 results) No results for input(s): PROBNP in the last 8760 hours. CBG: Recent Labs  Lab 03/30/17 0912 03/30/17 1249 03/30/17 1251  GLUCAP 278* 130* 143*    Radiological Exams on Admission: Dg Chest 2 View  Result Date: 03/30/2017 CLINICAL DATA:  Upper back pain, scapular pain, myalgias EXAM: CHEST  2 VIEW COMPARISON:  01/10/2016 FINDINGS: The heart size and mediastinal contours are within normal limits. Both lungs are clear. The visualized skeletal structures are unremarkable. IMPRESSION: No active cardiopulmonary disease. Electronically Signed   By: Judie PetitM.  Shick M.D.   On: 03/30/2017 10:24    EKG: Independently reviewed.  Sinus tachycardia  Assessment/Plan Principal Problem:   Viral gastroenteritis Active Problems:   DM type 1 (diabetes mellitus, type 1) (HCC)   Sinus tachycardia   1. Viral gastroenteritis-admit for observation, supportive therapy with IV fluids. 2. Moderate to severe dehydration- patient is  orthostatically positive and will be treated with IV fluid hydration. 3. Type 1 diabetes mellitus-check hemoglobin A1c, he is not in DKA at this time, continue Lantus and prandial insulin with sliding scale coverage.  Monitor CBGs before meals at bedtime and as needed. 4. Sinus tachycardia-suspect this is secondary to the dehydration which should improve with IV fluid.  DVT Prophylaxis: Lovenox Code Status: Full Family Communication: None present at bedside during rounds Disposition Plan: Home tomorrow if stable  Time spent: 57 minutes  Standley Dakinslanford Camrie Stock, MD [facility] Pager [phone]  If 7PM-7AM, please contact night-coverage [email or web address] Password [email or web address] 03/30/2017, 4:16 PM

## 2017-03-30 NOTE — ED Triage Notes (Signed)
Pt reports emesis started this morning at 0300, cannot keep medication or fluids down. Has had 1 episode of D/. Also c/o hot and cold chills and body aches, family members dx with flu.

## 2017-03-30 NOTE — ED Notes (Signed)
Pt states she does not need to urinate at this time, aware of DO  

## 2017-03-30 NOTE — ED Notes (Signed)
Ph: 7.355 PCO2: 51.7 PO2: no value Bicarb: 24.6

## 2017-03-30 NOTE — ED Provider Notes (Signed)
[facility] Provider Note   CSN: [id number] Arrival date & time: 03/30/17  [id number]     History   Chief Complaint Chief Complaint  Patient presents with  . Emesis    HPI Satira SarkJonathan D Garside is a 32 y.o. male.  HPI   Patient is a 32 year old male with a history of type 1 diabetes mellitus, hypertension, and anxiety presenting for emesis and one episode of diarrhea.  Patient reports that he woke up around 3 AM and has been vomiting a proximally 5 times an hour.  Patient reports one episode of watery diarrhea.  Patient reports that proceeding all of this, he experienced diffuse back pain that was radiating down his spine.  Patient reports that it is primarily in between the scapula on both sides.  Patient reports he felt similar pain the last time he was in diabetic ketoacidosis.  Patient denies any trauma.  Patient denies IVDU.  Patient denies any muscular weakness, numbness, saddle anesthesia, loss of bowel or bladder control.  Patient denies any recent fevers preceding his symptoms.  Patient denies abdominal pain.  Patient does report that he had a headache since he began vomiting and is frontal and 5/10 in severity, and throbbing.  Patient reports that due to financial issues, he recently had to switch from insulin pump to injecting subcutaneous insulin.  Patient reports that he has been compliant with his Lantus and Humalog.  Patient denies any lower extremity swelling, recent hospitalization, recent surgeries, recent immobilization, history of DVT/PE, hemoptysis, treatment for cancer, or hormone use.  Past Medical History:  Diagnosis Date  . Anxiety   . Diabetes mellitus   . Hypertension     Patient Active Problem List   Diagnosis Date Noted  . DKA (diabetic ketoacidoses) (HCC) 02/01/2011  . DM type 1 (diabetes mellitus, type 1) (HCC) 02/01/2011  . Low back pain 02/01/2011  . Depression 02/01/2011  . Elevated blood pressure 02/01/2011    Past Surgical History:   Procedure Laterality Date  . FRACTURE SURGERY     reconstructive left knee surgery  . KNEE SURGERY         Home Medications    Prior to Admission medications   Medication Sig Start Date End Date Taking? Authorizing Provider  albuterol (PROVENTIL HFA;VENTOLIN HFA) 108 (90 BASE) MCG/ACT inhaler Inhale 2 puffs into the lungs every 6 (six) hours as needed for wheezing or shortness of breath.    [provider]  benzonatate (TESSALON) 100 MG capsule Take 2 capsules (200 mg total) by mouth 3 (three) times daily as needed. 01/10/16   Burgess AmorIdol, Julie, PA-C  cetirizine-pseudoephedrine (ZYRTEC-D) 5-120 MG tablet Take 1 tablet by mouth 2 (two) times daily. 01/10/16   Burgess AmorIdol, Julie, PA-C  chlorpheniramine-HYDROcodone (TUSSIONEX PENNKINETIC ER) 10-8 MG/5ML SUER Take 5 mLs by mouth every 12 (twelve) hours as needed for cough. 01/10/16   Burgess AmorIdol, Julie, PA-C  Insulin Infusion Pump DEVI by Continuous infusion (non-IV) route continuous. Humalog insulin    [provider]  insulin lispro (HUMALOG) 100 UNIT/ML injection Inject 1-10 Units into the skin 3 (three) times daily before meals. Patient taking differently: Inject 1-10 Units into the skin continuous.  02/03/11   Elsaid, Hind I, MD  lisinopril (PRINIVIL,ZESTRIL) 5 MG tablet Take 5 mg by mouth daily.    [provider]  Pseudoeph-Doxylamine-DM-APAP (NYQUIL MULTI-SYMPTOM PO) Take 15-30 mLs by mouth daily as needed (for cold symptoms).    [provider]  insulin glargine (LANTUS) 100 UNIT/ML injection Inject  12 Units into the skin at bedtime. 02/03/11 04/29/11  Elsaid, Hind I, MD    Family History Family History  Problem Relation Age of Onset  . Hypertension Mother   . Hypertension Father     Social History Social History   Tobacco Use  . Smoking status: Never Smoker  . Smokeless tobacco: Current User    Types: Snuff  Substance Use Topics  . Alcohol use: Yes    Comment: occ  . Drug use: No     Allergies     Patient has no known allergies.   Review of Systems Review of Systems  Constitutional: Positive for chills. Negative for fever.  HENT: Negative for congestion and rhinorrhea.   Eyes: Negative for photophobia.  Respiratory: Negative for shortness of breath.   Cardiovascular: Negative for chest pain and leg swelling.  Gastrointestinal: Positive for diarrhea, nausea and vomiting. Negative for abdominal distention and abdominal pain.  Genitourinary: Negative for dysuria.  Musculoskeletal: Positive for arthralgias and myalgias.  Skin: Negative for rash.  Allergic/Immunologic: Positive for environmental allergies.  Neurological: Positive for headaches.     Physical Exam Updated Vital Signs BP (!) 141/94 (BP Location: Left Arm)   Pulse (!) 112   Temp 98.8 F (37.1 C) (Oral)   Resp 16   Ht 5\' 9"  (1.753 m)   Wt 78 kg (172 lb)   SpO2 94%   BMI 25.40 kg/m   Physical Exam  Constitutional: He appears well-developed and well-nourished. No distress.  HENT:  Head: Normocephalic and atraumatic.  Mouth/Throat: Oropharynx is clear and moist.  Eyes: Conjunctivae and EOM are normal. Pupils are equal, round, and reactive to light.  Allergic shiners present bilaterally.  Neck: Normal range of motion. Neck supple.  No nuchal rigidity.  No meningismus.  Cardiovascular: Normal rate, regular rhythm, S1 normal and S2 normal.  No murmur heard. Pulmonary/Chest: Effort normal and breath sounds normal. He has no wheezes. He has no rales.  Abdominal: Soft. He exhibits no distension. There is no tenderness. There is no guarding.  Musculoskeletal: Normal range of motion. He exhibits no edema or deformity.  Spine Exam: Inspection/Palpation: No midline tenderness of cervical, thoracic, or lumbar spine.  There is diffuse paraspinal muscular tenderness of cervical and thoracic spine. Strength: 5/5 throughout LE bilaterally (hip flexion/extension, adduction/abduction; knee flexion/extension; foot  dorsiflexion/plantarflexion, inversion/eversion; great toe inversion) Sensation: Intact to light touch in proximal and distal LE bilaterally Reflexes: 2+ quadriceps and achilles reflexes   Lymphadenopathy:    He has no cervical adenopathy.  Neurological: He is alert.  Cranial nerves grossly intact. Patient moves extremities symmetrically and with good coordination.  Skin: Skin is warm and dry. No rash noted. No erythema.  Psychiatric: He has a normal mood and affect. His behavior is normal. Judgment and thought content normal.  Nursing note and vitals reviewed.    ED Treatments / Results  Labs (all labs ordered are listed, but only abnormal results are displayed) Labs Reviewed  COMPREHENSIVE METABOLIC PANEL - Abnormal; Notable for the following components:      Result Value   Chloride 96 (*)    Glucose, Bld 271 (*)    Total Bilirubin 1.4 (*)    Anion gap 16 (*)    All other components within normal limits  CBC - Abnormal; Notable for the following components:   WBC 16.7 (*)    Hemoglobin 17.4 (*)    All other components within normal limits  CBG MONITORING, ED - Abnormal; Notable for the following  components:   Glucose-Capillary 278 (*)    All other components within normal limits  I-STAT CHEM 8, ED - Abnormal; Notable for the following components:   Chloride 99 (*)    Glucose, Bld 280 (*)    Calcium, Ion 1.07 (*)    Hemoglobin 17.7 (*)    All other components within normal limits  LIPASE, BLOOD  TROPONIN I  URINALYSIS, ROUTINE W REFLEX MICROSCOPIC  BLOOD GAS, VENOUS  I-STAT CHEM 8, ED    EKG  EKG Interpretation  Date/Time:  Saturday March 30 2017 09:37:07 EST Ventricular Rate:  109 PR Interval:    QRS Duration: 72 QT Interval:  308 QTC Calculation: 415 R Axis:   68 Text Interpretation:  Sinus tachycardia Probable anteroseptal infarct, old No old tracing to compare Confirmed by Margarita Grizzle [phone]) on 03/30/2017 10:16:54 AM       Radiology Dg Chest 2  View  Result Date: 03/30/2017 CLINICAL DATA:  Upper back pain, scapular pain, myalgias EXAM: CHEST  2 VIEW COMPARISON:  01/10/2016 FINDINGS: The heart size and mediastinal contours are within normal limits. Both lungs are clear. The visualized skeletal structures are unremarkable. IMPRESSION: No active cardiopulmonary disease. Electronically Signed   By: Judie Petit.  Shick M.D.   On: 03/30/2017 10:24    Procedures Procedures (including critical care time)  Medications Ordered in ED Medications  sodium chloride 0.9 % bolus 1,000 mL (not administered)    And  0.9 %  sodium chloride infusion (not administered)  dextrose 5 %-0.45 % sodium chloride infusion (not administered)  insulin regular (NOVOLIN R,HUMULIN R) 100 Units in sodium chloride 0.9 % 100 mL (1 Units/mL) infusion (not administered)  potassium chloride 10 mEq in 100 mL IVPB (not administered)  ondansetron (ZOFRAN) injection 4 mg (not administered)  morphine 4 MG/ML injection 4 mg (not administered)  0.9 %  sodium chloride infusion ( Intravenous Stopped 03/30/17 1102)     Initial Impression / Assessment and Plan / ED Course  I have reviewed the triage vital signs and the nursing notes.  Pertinent labs & imaging results that were available during my care of the patient were reviewed by me and considered in my medical decision making (see chart for details).  Clinical Course as of Mar 30 1336  Sat Mar 30, 2017  1108 Patient reevaluated.  Patient has an anion gap of 16.  Suspect that patient is developing early DKA.  Patient continued to report that he has diffuse discomfort up and down the spine that is migratory.  Patient continues to deny any focal neurologic symptoms in his extremities.  Patient reports he has had 2 other episodes of emesis one episode diarrhea.  Will order Zofran and morphine.  Will start patient on insulin drip as well as additional fluid bolus and recheck lites every 2 hours.  [AM]  1336 pO2, Ven: CRITICAL RESULT  CALLED TO, READ BACK BY AND VERIFIED WITH: [AM]    Clinical Course User Index [AM] Elisha Ponder, PA-C    Patient is nontoxic-appearing but tachycardic and uncomfortable.  Suspicion for DKA.  Will await i-STAT Chem-8 with electrolytes and anion gap.  Doubt pulmonary embolism as cause of patient's scapular pain as he does not have any pertinent DVT or PE risk factors.  Cannot completely PERC rule out patient because of tachycardia. Suspect viral gastroenteritis.  Unclear cause of patient's back pain, however patient is neurologically intact in the extremities and is without fever.  Abdomen is benign.  Do not suspect epidural  abscess.  We will proceed with CMP, troponin, lipase, EKG, and chest x-ray at this time.  On CMP, patient had anion gap of 16.  Venous blood gas demonstrates pH of 3.55.  Patient exhibits a leukocytosis of 16.7.  Chest x-ray clear of infiltrate.  EKG without acute ischemic findings and no prior to compare.  Troponin negative.  Case was discussed with Dr. Hipolito Bayley.  Will initiate insulin drip and attempt to prevent fulminant DKA from evolving.    Patient care was signed out to Dr. Hipolito Bayley, who will follow patient's electrolytes and make ultimate disposition.  Final Clinical Impressions(s) / ED Diagnoses   Final diagnoses:  None    ED Discharge Orders    None       Elisha Ponder, PA-C 03/30/17 1340    [address], [place] 03/30/17 1341    Margarita Grizzle, MD 03/30/17 1501    Margarita Grizzle, MD 03/30/17 [phone]

## 2017-03-31 ENCOUNTER — Observation Stay (HOSPITAL_COMMUNITY): Payer: BLUE CROSS/BLUE SHIELD

## 2017-03-31 DIAGNOSIS — E860 Dehydration: Secondary | ICD-10-CM | POA: Diagnosis not present

## 2017-03-31 DIAGNOSIS — R509 Fever, unspecified: Secondary | ICD-10-CM | POA: Diagnosis not present

## 2017-03-31 DIAGNOSIS — A084 Viral intestinal infection, unspecified: Secondary | ICD-10-CM | POA: Diagnosis not present

## 2017-03-31 DIAGNOSIS — E109 Type 1 diabetes mellitus without complications: Secondary | ICD-10-CM | POA: Diagnosis not present

## 2017-03-31 LAB — BASIC METABOLIC PANEL
Anion gap: 8 (ref 5–15)
BUN: 12 mg/dL (ref 6–20)
CO2: 29 mmol/L (ref 22–32)
Calcium: 8.1 mg/dL — ABNORMAL LOW (ref 8.9–10.3)
Chloride: 104 mmol/L (ref 101–111)
Creatinine, Ser: 0.87 mg/dL (ref 0.61–1.24)
GFR calc Af Amer: >60 mL/min (ref >60)
GFR calc non Af Amer: >60 mL/min (ref >60)
Glucose, Bld: 244 mg/dL — ABNORMAL HIGH (ref 65–99)
Potassium: 4 mmol/L (ref 3.5–5.1)
Sodium: 141 mmol/L (ref 135–145)

## 2017-03-31 LAB — CBC WITH DIFFERENTIAL/PLATELET
Basophils Absolute: 0 10*3/uL (ref 0.0–0.1)
Basophils Relative: 0 %
Eosinophils Absolute: 0.1 10*3/uL (ref 0.0–0.7)
Eosinophils Relative: 1 %
HCT: 37.7 % — ABNORMAL LOW (ref 39.0–52.0)
Hemoglobin: 12.9 g/dL — ABNORMAL LOW (ref 13.0–17.0)
Lymphocytes Relative: 7 %
Lymphs Abs: 0.4 10*3/uL — ABNORMAL LOW (ref 0.7–4.0)
MCH: 29.7 pg (ref 26.0–34.0)
MCHC: 34.2 g/dL (ref 30.0–36.0)
MCV: 86.7 fL (ref 78.0–100.0)
Monocytes Absolute: 0.4 10*3/uL (ref 0.1–1.0)
Monocytes Relative: 6 %
Neutro Abs: 4.6 10*3/uL (ref 1.7–7.7)
Neutrophils Relative %: 86 %
Platelets: 126 10*3/uL — ABNORMAL LOW (ref 150–400)
RBC: 4.35 MIL/uL (ref 4.22–5.81)
RDW: 12.2 % (ref 11.5–15.5)
WBC: 5.5 10*3/uL (ref 4.0–10.5)

## 2017-03-31 LAB — HIV ANTIBODY (ROUTINE TESTING W REFLEX): HIV Screen 4th Generation wRfx: NONREACTIVE

## 2017-03-31 LAB — GLUCOSE, CAPILLARY
Glucose-Capillary: 215 mg/dL — ABNORMAL HIGH (ref 65–99)
Glucose-Capillary: 140 mg/dL — ABNORMAL HIGH (ref 65–99)

## 2017-03-31 MED ORDER — INSULIN ASPART 100 UNIT/ML ~~LOC~~ SOLN
6.0000 [IU] | Freq: Three times a day (TID) | SUBCUTANEOUS | Status: DC
  Administered 2017-03-31 (×2): 6 [IU] via SUBCUTANEOUS

## 2017-03-31 MED ORDER — PROMETHAZINE HCL 25 MG PO TABS: 12.5000 mg | ORAL_TABLET | Freq: Four times a day (QID) | ORAL | 0 refills | Status: DC | PRN

## 2017-03-31 NOTE — Progress Notes (Signed)
Removed IV-clean, dry, intact. Reviewed d/c paperwork with pt and answered all questions. Wheeled stable pt and belongings to main entrance where his dad picked him up.

## 2017-03-31 NOTE — Discharge Summary (Addendum)
Physician Discharge Summary  Eric [name] WGN:[id number] DOB: 1985/03/15 DOA: 03/30/2017  PCP: Assunta Found, MD  Admit date: 03/30/2017 Discharge date: 03/31/2017  Admitted From: Home  Disposition: Home  Recommendations for Outpatient Follow-up:  1. Follow up with PCP in 1 weeks 2. Please obtain BMP/CBC in one week 3. Please follow up on the following pending results:final culture data  Discharge Condition: STABLE   CODE STATUS: FULL    Brief Hospitalization Summary: Please see all hospital notes, images, labs for full details of the hospitalization.  HPI: Eric [name] is a 32 y.o. male with type 1 diabetes mellitus and hypertension who apparently recently started using insulin injections after he could no longer afford the supplies associated with his insulin pump reported that at around 3 AM he developed emesis and malaise and had a difficult time keeping down fluids.  He also had one episode of diarrhea.  He has had some hot and cold chills and body aches.  He has several family members at home that have been diagnosed with flulike illnesses.  He reports that he has been taking his insulin.  He took 30 units of Lantus this morning at 8 AM which he normally does.  He was seen in the emergency department and noted to be dehydrated with sinus tachycardia.  He was afebrile.  He was orthostatically positive.  In addition, he had a mildly elevated blood glucose but a normal anion gap.  He had no ketones in his urine.  He did not have DKA.  He did receive some IV insulin in the ED but that has been stopped at this time.  He was given IV fluid hydration and admission was requested for further treatment.  The patient denies dysuria and chest pain.  The patient denies shortness of breath.  The patient's chest x-ray had no acute findings.  Patient was not in DKA when he was admitted.  He was moderately dehydrated and had symptoms consistent with a viral gastroenteritis.  He was started on IV  fluids for hydration.  He was orthostatically positive.  He was resumed on his home Lantus and prandial insulin and his blood glucose readings remained very stable and controlled.  He initially had sinus tachycardia that resolved with IV fluid hydration.  He had a fever initially that has resolved.  He was initially started on a full liquid diet and then subsequently advance to a carbohydrate modified diet which he has tolerated.  He reports now that his diarrhea has resolved and he is tolerating diet and able to eat and drink without difficulty.  He will be discharged home to follow-up with his outpatient primary care provider and diabetes care provider.  The patient declined additional hospital stay. Of note, his hemoglobin A1c was 8.2% and he was encouraged to follow-up with his diabetes care provider.  He reports that he has had some difficulty with maintaining his insulin pump because of cost of supplies and has been switched over to subcu insulin since that time.  He reports that he does have all of his insulin supplies and insulin at home.  His white blood cell count was initially elevated but has since returned to normal.  Discharge Diagnoses:  Principal Problem:   Viral gastroenteritis Active Problems:   DM type 1 (diabetes mellitus, type 1) (HCC)   Sinus tachycardia   Dehydration   Fever, unspecified  Discharge Instructions: Discharge Instructions    Call MD for:  difficulty breathing, headache or visual disturbances  Complete by:  As directed    Call MD for:  extreme fatigue   Complete by:  As directed    Call MD for:  persistant dizziness or light-headedness   Complete by:  As directed    Call MD for:  persistant nausea and vomiting   Complete by:  As directed    Increase activity slowly   Complete by:  As directed      Allergies as of 03/31/2017   No Known Allergies     Medication List    STOP taking these medications   Insulin Infusion Pump Devi     TAKE these  medications   albuterol 108 (90 Base) MCG/ACT inhaler Commonly known as:  PROVENTIL HFA;VENTOLIN HFA Inhale 2 puffs into the lungs every 6 (six) hours as needed for wheezing or shortness of breath.   insulin glargine 100 UNIT/ML injection Commonly known as:  LANTUS Inject 30 Units into the skin daily.   insulin lispro 100 UNIT/ML injection Commonly known as:  HUMALOG Inject 1-10 Units into the skin 3 (three) times daily before meals. What changed:  when to take this   lisinopril 10 MG tablet Commonly known as:  PRINIVIL,ZESTRIL Take 10 mg by mouth daily.   promethazine 25 MG tablet Commonly known as:  PHENERGAN Take 0.5 tablets (12.5 mg total) by mouth every 6 (six) hours as needed for nausea or vomiting.      Follow-up Information    Assunta Found, MD. Schedule an appointment as soon as possible for a visit in 1 week(s).   Specialty:  Family Medicine Why:  Hospital follow-up Contact information: [address] [place] [address] [phone]          No Known Allergies Allergies as of 03/31/2017   No Known Allergies     Medication List    STOP taking these medications   Insulin Infusion Pump Devi     TAKE these medications   albuterol 108 (90 Base) MCG/ACT inhaler Commonly known as:  PROVENTIL HFA;VENTOLIN HFA Inhale 2 puffs into the lungs every 6 (six) hours as needed for wheezing or shortness of breath.   insulin glargine 100 UNIT/ML injection Commonly known as:  LANTUS Inject 30 Units into the skin daily.   insulin lispro 100 UNIT/ML injection Commonly known as:  HUMALOG Inject 1-10 Units into the skin 3 (three) times daily before meals. What changed:  when to take this   lisinopril 10 MG tablet Commonly known as:  PRINIVIL,ZESTRIL Take 10 mg by mouth daily.   promethazine 25 MG tablet Commonly known as:  PHENERGAN Take 0.5 tablets (12.5 mg total) by mouth every 6 (six) hours as needed for nausea or vomiting.        Procedures/Studies: Dg Chest 2 View  Result Date: 03/30/2017 CLINICAL DATA:  Upper back pain, scapular pain, myalgias EXAM: CHEST  2 VIEW COMPARISON:  01/10/2016 FINDINGS: The heart size and mediastinal contours are within normal limits. Both lungs are clear. The visualized skeletal structures are unremarkable. IMPRESSION: No active cardiopulmonary disease. Electronically Signed   By: Judie Petit.  Shick M.D.   On: 03/30/2017 10:24   Dg Chest Port 1 View  Result Date: 03/31/2017 CLINICAL DATA:  Fever EXAM: PORTABLE CHEST 1 VIEW COMPARISON:  03/30/2017 FINDINGS: The heart size and mediastinal contours are within normal limits. Both lungs are clear. The visualized skeletal structures are unremarkable. IMPRESSION: No active disease. Electronically Signed   By: Judie Petit.  Shick M.D.   On: 03/31/2017 10:44  Subjective: The patient reports that he has been eating and drinking and his diarrhea has resolved.  Discharge Exam: Vitals:   03/30/17 2344 03/31/17 0500  BP:  (!) 110/59  Pulse:  91  Resp:  14  Temp: 99.7 F (37.6 C) 97.9 F (36.6 C)  SpO2:  99%   Vitals:   03/30/17 1650 03/30/17 2100 03/30/17 2344 03/31/17 0500  BP: 119/62 130/65  (!) 110/59  Pulse: (!) 116 (!) 129  91  Resp: 14 14  14   Temp: 99.4 F (37.4 C) (!) 102.9 F (39.4 C) 99.7 F (37.6 C) 97.9 F (36.6 C)  TempSrc: Oral Oral Oral Oral  SpO2: 98% 98%  99%  Weight: 76.1 kg (167 lb 12.3 oz)     Height: 5\' 9"  (1.753 m)       General: Pt is alert, awake, not in acute distress Cardiovascular: RRR, S1/S2 +, no rubs, no gallops Respiratory: CTA bilaterally, no wheezing, no rhonchi Abdominal: Soft, NT, ND, bowel sounds + Extremities: no edema, no cyanosis   The results of significant diagnostics from this hospitalization (including imaging, microbiology, ancillary and laboratory) are listed below for reference.    Microbiology: No results found for this or any previous visit (from the past 240 hour(s)).   Labs: BNP (last  3 results) No results for input(s): BNP in the last 8760 hours. Basic Metabolic Panel: Recent Labs  Lab 03/30/17 0923 03/30/17 0935 03/30/17 1158 03/30/17 1359 03/31/17 0616  NA 135 137 138 139 141  K 4.2 4.2 4.2 4.2 4.0  CL 96* 99* 99* 100* 104  CO2 23  --  28 27 29   GLUCOSE 271* 280* 204* 135* 244*  BUN 17 17 17 16 12   CREATININE 0.85 0.70 0.96 0.90 0.87  CALCIUM 9.5  --  8.6* 8.4* 8.1*   Liver Function Tests: Recent Labs  Lab 03/30/17 0923  AST 20  ALT 26  ALKPHOS 103  BILITOT 1.4*  PROT 7.8  ALBUMIN 4.5   Recent Labs  Lab 03/30/17 0923  LIPASE 18   No results for input(s): AMMONIA in the last 168 hours. CBC: Recent Labs  Lab 03/30/17 0923 03/30/17 0935 03/31/17 0616  WBC 16.7*  --  5.5  NEUTROABS  --   --  4.6  HGB 17.4* 17.7* 12.9*  HCT 49.1 52.0 37.7*  MCV 84.7  --  86.7  PLT 197  --  126*   Cardiac Enzymes: Recent Labs  Lab 03/30/17 0923  TROPONINI <0.03   BNP: Invalid input(s): POCBNP CBG: Recent Labs  Lab 03/30/17 1251 03/30/17 1728 03/30/17 2217 03/31/17 0727 03/31/17 1151  GLUCAP 143* 90 123* 215* 140*   D-Dimer No results for input(s): DDIMER in the last 72 hours. Hgb A1c Recent Labs    03/30/17 0920  HGBA1C 8.2*   Lipid Profile No results for input(s): CHOL, HDL, LDLCALC, TRIG, CHOLHDL, LDLDIRECT in the last 72 hours. Thyroid function studies No results for input(s): TSH, T4TOTAL, T3FREE, THYROIDAB in the last 72 hours.  Invalid input(s): FREET3 Anemia work up No results for input(s): VITAMINB12, FOLATE, FERRITIN, TIBC, IRON, RETICCTPCT in the last 72 hours. Urinalysis    Component Value Date/Time   COLORURINE YELLOW 03/30/2017 1140   APPEARANCEUR CLEAR 03/30/2017 1140   LABSPEC 1.026 03/30/2017 1140   PHURINE 6.0 03/30/2017 1140   GLUCOSEU >=500 (A) 03/30/2017 1140   HGBUR NEGATIVE 03/30/2017 1140   BILIRUBINUR NEGATIVE 03/30/2017 1140   KETONESUR NEGATIVE 03/30/2017 1140   PROTEINUR 100 (A) 03/30/2017 1140  UROBILINOGEN 0.2 02/15/2013 1533   NITRITE NEGATIVE 03/30/2017 1140   LEUKOCYTESUR NEGATIVE 03/30/2017 1140   Sepsis Labs Invalid input(s): PROCALCITONIN,  WBC,  LACTICIDVEN Microbiology No results found for this or any previous visit (from the past 240 hour(s)).  Time coordinating discharge:   SIGNED:  Standley Dakinslanford Preciliano Castell, MD  [facility] 03/31/2017, 3:29 PM Pager [phone]  If 7PM-7AM, please contact night-coverage [email or web address] Password [email or web address]

## 2017-03-31 NOTE — Discharge Instructions (Signed)
Follow with Primary MD  Assunta FoundGolding, John, MD  and other consultant's as instructed your Hospitalist MD  Please get a complete blood count and chemistry panel checked by your Primary MD at your next visit, and again as instructed by your Primary MD.  Get Medicines reviewed and adjusted: Please take all your medications with you for your next visit with your Primary MD  Laboratory/radiological data: Please request your Primary MD to go over all hospital tests and procedure/radiological results at the follow up, please ask your Primary MD to get all Hospital records sent to his/her office.  In some cases, they will be blood work, cultures and biopsy results pending at the time of your discharge. Please request that your primary care M.D. follows up on these results.  Also Note the following: If you experience worsening of your admission symptoms, develop shortness of breath, life threatening emergency, suicidal or homicidal thoughts you must seek medical attention immediately by calling 911 or calling your MD immediately  if symptoms less severe.  You must read complete instructions/literature along with all the possible adverse reactions/side effects for all the Medicines you take and that have been prescribed to you. Take any new Medicines after you have completely understood and accpet all the possible adverse reactions/side effects.   Do not drive when taking Pain medications or sleeping medications (Benzodaizepines)  Do not take more than prescribed Pain, Sleep and Anxiety Medications. It is not advisable to combine anxiety,sleep and pain medications without talking with your primary care practitioner  Special Instructions: If you have smoked or chewed Tobacco  in the last 2 yrs please stop smoking, stop any regular Alcohol  and or any Recreational drug use.  Wear Seat belts while driving.  Please note: You were cared for by a hospitalist during your hospital stay. Once you are discharged,  your primary care physician will handle any further medical issues. Please note that NO REFILLS for any discharge medications will be authorized once you are discharged, as it is imperative that you return to your primary care physician (or establish a relationship with a primary care physician if you do not have one) for your post hospital discharge needs so that they can reassess your need for medications and monitor your lab values.     Coping With Diabetes Diabetes (type 1 diabetes mellitus or type 2 diabetes mellitus) is a condition in which the body does not have enough of a hormone called insulin, or the body does not respond properly to insulin. Normally, insulin allows sugars (glucose) to enter cells in the body. The cells use glucose for energy. With diabetes, extra glucose builds up in the blood instead of going into cells, which results in high blood glucose (hyperglycemia). How to manage lifestyle changes Managing diabetes includes medical treatments as well as lifestyle changes. If diabetes is not managed well, serious physical and emotional complications can occur. Taking good care of yourself means that you are responsible for:  Monitoring glucose regularly.  Eating a healthy diet.  Exercising regularly.  Meeting with health care providers.  Taking medicines as directed.  Some people may feel a lot of stress about managing their diabetes. This is known as emotional distress, and it is very common. Living with diabetes can place you at risk for emotional distress, depression, or anxiety. These disorders can be confusing and can make diabetes management more difficult. How to recognize stress Emotional distress Symptoms of emotional distress include:  Anger about having a diagnosis of diabetes.  Fear or frustration about your diagnosis and the changes you need to make to manage the condition.  Being overly worried about the care that you need or the cost of the care you  need.  Feeling like you caused your condition by doing something wrong.  Fear of unpredictable situations, like low or high blood glucose.  Feeling judged by your health care providers.  Feeling very alone with the disease.  Getting too tired or "burned out" with the demands of daily care.  Depression Having diabetes means that you are at a higher risk for depression. Having depression also means that you are at a higher risk for diabetes. Your health care provider may test (screen) you for symptoms of depression. It is important to recognize depression symptoms and to start treatment for it soon after it is diagnosed. The following are some symptoms of depression:  Loss of interest in things that you used to enjoy.  Trouble sleeping, or often waking up early and not being able to get back to sleep.  A change in appetite.  Feeling tired most of the day.  Feeling nervous and anxious.  Feeling guilty and worrying that you are a burden to others.  Feeling depressed more often than you do not feel that way.  Thoughts of hurting yourself or feeling that you want to die.  If you have any of these symptoms for 2 weeks or longer, reach out to a health care provider. Where to find support  Ask your health care provider to recommend a therapist who understands both depression and diabetes.  Search for information and support from the American Diabetes Association: www.diabetes.org  Find a certified diabetes educator and make an appointment through American Association of Diabetes Educators: www.diabeteseducator.org Follow these instructions at home: Managing emotional distress The following are some ways to manage emotional distress:  Talk with your health care provider or certified diabetes educator. Consider working with a counselor or therapist.  Learn as much as you can about diabetes and its treatment. Meet with a certified diabetes educator or take a class to learn how to  manage your condition.  Keep a journal of your thoughts and concerns.  Accept that some things are out of your control.  Talk with other people who have diabetes. It can help to talk with others about the emotional distress that you feel.  Find ways to manage stress that work for you. These may include art or music therapy, exercise, meditation, and hobbies.  Seek support from spiritual leaders, family, and friends.  General instructions  Follow your diabetes management plan.  Keep all follow-up visits as told by your health care provider. This is important. Get help right away if:  You have thoughts about hurting yourself or others. If you ever feel like you may hurt yourself or others, or have thoughts about taking your own life, get help right away. You can go to your nearest emergency department or call:  Your local emergency services (911 in the [place]  A suicide crisis helpline, such as the National Suicide Prevention Lifeline at 838-876-9211. This is open 24 hours a day.  Summary  Diabetes (type 1 diabetes mellitus or type 2 diabetes mellitus) is a condition in which the body does not have enough of a hormone called insulin, or the body does not respond properly to insulin.  Living with diabetes puts you at risk for medical issues, and it also puts you at risk for emotional issues such as emotional distress,  depression, and anxiety.  Recognizing the symptoms of emotional distress and depression may help you avoid problems with your diabetes control. It is important to start treatment for emotional distress and depression soon after they are diagnosed.  Having diabetes means that you are at a higher risk for depression. Ask your health care provider to recommend a therapist who understands both depression and diabetes.  If you experience symptoms of emotional distress or depression, it is important to discuss this with your health care provider, certified diabetes  educator, or therapist. This information is not intended to replace advice given to you by your health care provider. Make sure you discuss any questions you have with your health care provider. Document Released: 06/14/2016 Document Revised: 06/14/2016 Document Reviewed: 06/14/2016 Elsevier Interactive Patient Education  2018 [employer]      Dehydration, Adult Dehydration is when there is not enough fluid or water in your body. This happens when you lose more fluids than you take in. Dehydration can range from mild to very bad. It should be treated right away to keep it from getting very bad. Symptoms of mild dehydration may include:  Thirst.  Dry lips.  Slightly dry mouth.  Dry, warm skin.  Dizziness. Symptoms of moderate dehydration may include:  Very dry mouth.  Muscle cramps.  Dark pee (urine). Pee may be the color of tea.  Your body making less pee.  Your eyes making fewer tears.  Heartbeat that is uneven or faster than normal (palpitations).  Headache.  Light-headedness, especially when you stand up from sitting.  Fainting (syncope). Symptoms of very bad dehydration may include:  Changes in skin, such as: ? Cold and clammy skin. ? Blotchy (mottled) or pale skin. ? Skin that does not quickly return to normal after being lightly pinched and let go (poor skin turgor).  Changes in body fluids, such as: ? Feeling very thirsty. ? Your eyes making fewer tears. ? Not sweating when body temperature is high, such as in hot weather. ? Your body making very little pee.  Changes in vital signs, such as: ? Weak pulse. ? Pulse that is more than 100 beats a minute when you are sitting still. ? Fast breathing. ? Low blood pressure.  Other changes, such as: ? Sunken eyes. ? Cold hands and feet. ? Confusion. ? Lack of energy (lethargy). ? Trouble waking up from sleep. ? Short-term weight loss. ? Unconsciousness. Follow these instructions at home:  If told  by your doctor, drink an ORS: ? Make an ORS by using instructions on the package. ? Start by drinking small amounts, about  cup (120 mL) every 5-10 minutes. ? Slowly drink more until you have had the amount that your doctor said to have.  Drink enough clear fluid to keep your pee clear or pale yellow. If you were told to drink an ORS, finish the ORS first, then start slowly drinking clear fluids. Drink fluids such as: ? Water. Do not drink only water by itself. Doing that can make the salt (sodium) level in your body get too low (hyponatremia). ? Ice chips. ? Fruit juice that you have added water to (diluted). ? Low-calorie sports drinks.  Avoid: ? Alcohol. ? Drinks that have a lot of sugar. These include high-calorie sports drinks, fruit juice that does not have water added, and soda. ? Caffeine. ? Foods that are greasy or have a lot of fat or sugar.  Take over-the-counter and prescription medicines only as told by your doctor.  Do not take salt tablets. Doing that can make the salt level in your body get too high (hypernatremia).  Eat foods that have minerals (electrolytes). Examples include bananas, oranges, potatoes, tomatoes, and spinach.  Keep all follow-up visits as told by your doctor. This is important. Contact a doctor if:  You have belly (abdominal) pain that: ? Gets worse. ? Stays in one area (localizes).  You have a rash.  You have a stiff neck.  You get angry or annoyed more easily than normal (irritability).  You are more sleepy than normal.  You have a harder time waking up than normal.  You feel: ? Weak. ? Dizzy. ? Very thirsty.  You have peed (urinated) only a small amount of very dark pee during 6-8 hours. Get help right away if:  You have symptoms of very bad dehydration.  You cannot drink fluids without throwing up (vomiting).  Your symptoms get worse with treatment.  You have a fever.  You have a very bad headache.  You are throwing up  or having watery poop (diarrhea) and it: ? Gets worse. ? Does not go away.  You have blood or something green (bile) in your throw-up.  You have blood in your poop (stool). This may cause poop to look black and tarry.  You have not peed in 6-8 hours.  You pass out (faint).  Your heart rate when you are sitting still is more than 100 beats a minute.  You have trouble breathing. This information is not intended to replace advice given to you by your health care provider. Make sure you discuss any questions you have with your health care provider. Document Released: 11/25/2008 Document Revised: 08/19/2015 Document Reviewed: 03/25/2015 Elsevier Interactive Patient Education  2018 [employer].   Fever, Adult A fever is an increase in the bodys temperature. It is often defined as a temperature of 100 F (38C) or higher. Short mild or moderate fevers often have no long-term effects. They also often do not need treatment. Moderate or high fevers may make you feel uncomfortable. Sometimes, they can also be a sign of a serious illness or disease. The sweating that may happen with repeated fevers or fevers that last a while may also cause you to not have enough fluid in your body (dehydration). You can take your temperature with a thermometer to see if you have a fever. A measured temperature can change with:  Age.  Time of day.  Where the thermometer is placed: ? Mouth (oral). ? Rectum (rectal). ? Ear (tympanic). ? Underarm (axillary). ? Forehead (temporal).  Follow these instructions at home: Pay attention to any changes in your symptoms. Take these actions to help with your condition:  Take over-the-counter and prescription medicines only as told by your doctor. Follow the dosing instructions carefully.  If you were prescribed an antibiotic medicine, take it as told by your doctor. Do not stop taking the antibiotic even if you start to feel better.  Rest as needed.  Drink  enough fluid to keep your pee (urine) clear or pale yellow.  Sponge yourself or bathe with room-temperature water as needed. This helps to lower your body temperature . Do not use ice water.  Do not wear too many blankets or heavy clothes.  Contact a doctor if:  You throw up (vomit).  You cannot eat or drink without throwing up.  You have watery poop (diarrhea).  It hurts when you pee.  Your symptoms do not get better with treatment.  You have new symptoms.  You feel very weak. Get help right away if:  You are short of breath or have trouble breathing.  You are dizzy or you pass out (faint).  You feel confused.  You have signs of not having enough fluid in your body, such as: ? A dry mouth. ? Peeing less. ? Looking pale.  You have very bad pain in your belly (abdomen).  You keep throwing up or having water poop.  You have a skin rash.  Your symptoms suddenly get worse. This information is not intended to replace advice given to you by your health care provider. Make sure you discuss any questions you have with your health care provider. Document Released: 11/08/2007 Document Revised: 07/07/2015 Document Reviewed: 03/25/2014 Elsevier Interactive Patient Education  2018 [employer].   How to Avoid Diabetes Mellitus Problems You can take action to prevent or slow down problems that are caused by diabetes (diabetes mellitus). Following your diabetes plan and taking care of yourself can reduce your risk of serious or life-threatening complications. Manage your diabetes  Follow instructions from your health care providers about managing your diabetes. Your diabetes may be managed by a team of health care providers who can teach you how to care for yourself and can answer questions that you have.  Educate yourself about your condition so you can make healthy choices about eating and physical activity.  Check your blood sugar (glucose) levels as often as directed. Your  health care provider will help you decide how often to check your blood glucose level depending on your treatment goals and how well you are meeting them.  Ask your health care provider if you should take low-dose aspirin daily and what dose is recommended for you. Taking low-dose aspirin daily is recommended to help prevent cardiovascular disease. Do not use nicotine or tobacco Do not use any products that contain nicotine or tobacco, such as cigarettes and e-cigarettes. If you need help quitting, ask your health care provider. Nicotine raises your risk for diabetes problems. If you quit using nicotine:  You will lower your risk for heart attack, stroke, nerve disease, and kidney disease.  Your cholesterol and blood pressure may improve.  Your blood circulation will improve.  Keep your blood pressure under control To control your blood pressure:  Follow instructions from your health care provider about meal planning, exercise, and medicines.  Make sure your health care provider checks your blood pressure at every medical visit.  A blood pressure reading consists of two numbers. Generally, the goal is to keep your top number (systolic pressure) at or below 130, and your bottom number (diastolic pressure) at or below 80. Your health care provider may recommend a lower target blood pressure. Your individualized target blood pressure is determined based on:  Your age.  Your medicines.  How long you have had diabetes.  Any other medical conditions you have.  Keep your cholesterol under control To control your cholesterol:  Follow instructions from your health care provider about meal planning, exercise, and medicines.  Have your cholesterol checked at least once a year.  You may be prescribed medicine to lower cholesterol (statin). If you are not taking a statin, ask your health care provider if you should be.  Controlling your cholesterol may:  Help prevent heart disease and  stroke. These are the most common health problems for people with diabetes.  Improve your blood flow.  Schedule and keep yearly physical exams and eye exams Your health  care provider will tell you how often you need medical visits depending on your diabetes management plan. Keep all follow-up visits as directed. This is important so possible problems can be identified early and complications can be avoided or treated.  Every visit with your health care provider should include measuring your: ? Weight. ? Blood pressure. ? Blood glucose control.  Your A1c (hemoglobin A1c) level should be checked: ? At least 2 times a year, if you are meeting your treatment goals. ? 4 times a year, if you are not meeting treatment goals or if your treatment goals have changed.  Your blood lipids (lipid profile) should be checked yearly. You should also be checked yearly for protein in your urine (urine microalbumin).  If you have type 1 diabetes, get an eye exam 3-5 years after you are diagnosed, and then once a year after your first exam.  If you have type 2 diabetes, get an eye exam as soon as you are diagnosed, and then once a year after your first exam.  Keep your vaccines current It is recommended that you receive:  A flu (influenza) vaccine every year.  A pneumonia (pneumococcal) vaccine and a hepatitis B vaccine. If you are age 71 or older, you may get the pneumonia vaccine as a series of two separate shots.  Ask your health care provider which other vaccines may be recommended. Take care of your feet Diabetes may cause you to have poor blood circulation to your legs and feet. Because of this, taking care of your feet is very important. Diabetes can cause:  The skin on the feet to get thinner, break more easily, and heal more slowly.  Nerve damage in your legs and feet, which results in decreased feeling. You may not notice minor injuries that could lead to serious problems.  To avoid foot  problems:  Check your skin and feet every day for cuts, bruises, redness, blisters, or sores.  Schedule a foot exam with your health care provider once every year. This exam includes: ? Inspecting of the structure and skin of your feet. ? Checking the pulses and sensation in your feet.  Make sure that your health care provider performs a visual foot exam at every medical visit.  Take care of your teeth People with poorly controlled diabetes are more likely to have gum (periodontal) disease. Diabetes can make periodontal diseases harder to control. If not treated, periodontal diseases can lead to tooth loss. To prevent this:  Brush your teeth twice a day.  Floss at least once a day.  Visit your dentist 2 times a year.  Drink responsibly Limit alcohol intake to no more than 1 drink a day for nonpregnant women and 2 drinks a day for men. One drink equals 12 oz of beer, 5 oz of wine, or 1 oz of hard liquor. It is important to eat food when you drink alcohol to avoid low blood glucose (hypoglycemia). Avoid alcohol if you:  Have a history of alcohol abuse or dependence.  Are pregnant.  Have liver disease, pancreatitis, advanced neuropathy, or severe hypertriglyceridemia.  Lessen stress Living with diabetes can be stressful. When you are experiencing stress, your blood glucose may be affected in two ways:  Stress hormones may cause your blood glucose to rise.  You may be distracted from taking good care of yourself.  Be aware of your stress level and make changes to help you manage challenging situations. To lower your stress levels:  Consider joining a support  group.  Do planned relaxation or meditation.  Do a hobby that you enjoy.  Maintain healthy relationships.  Exercise regularly.  Work with your health care provider or a mental health professional.  Summary  You can take action to prevent or slow down problems that are caused by diabetes (diabetes mellitus).  Following your diabetes plan and taking care of yourself can reduce your risk of serious or life-threatening complications.  Follow instructions from your health care providers about managing your diabetes. Your diabetes may be managed by a team of health care providers who can teach you how to care for yourself and can answer questions that you have.  Your health care provider will tell you how often you need medical visits depending on your diabetes management plan. Keep all follow-up visits as directed. This is important so possible problems can be identified early and complications can be avoided or treated. This information is not intended to replace advice given to you by your health care provider. Make sure you discuss any questions you have with your health care provider. Document Released: 10/17/2010 Document Revised: 10/29/2015 Document Reviewed: 10/29/2015 Elsevier Interactive Patient Education  [employer].

## 2017-04-03 ENCOUNTER — Other Ambulatory Visit: Payer: Self-pay

## 2017-04-03 ENCOUNTER — Encounter (HOSPITAL_COMMUNITY): Payer: Self-pay | Admitting: *Deleted

## 2017-04-03 ENCOUNTER — Emergency Department (HOSPITAL_COMMUNITY): Payer: BLUE CROSS/BLUE SHIELD

## 2017-04-03 ENCOUNTER — Emergency Department (HOSPITAL_COMMUNITY)
Admission: EM | Admit: 2017-04-03 | Discharge: 2017-04-03 | Disposition: A | Discharge status: Home / Self Care | Payer: BLUE CROSS/BLUE SHIELD | Attending: Emergency Medicine | Admitting: Emergency Medicine

## 2017-04-03 DIAGNOSIS — Z794 Long term (current) use of insulin: Secondary | ICD-10-CM | POA: Diagnosis not present

## 2017-04-03 DIAGNOSIS — E860 Dehydration: Secondary | ICD-10-CM | POA: Diagnosis not present

## 2017-04-03 DIAGNOSIS — I10 Essential (primary) hypertension: Secondary | ICD-10-CM | POA: Insufficient documentation

## 2017-04-03 DIAGNOSIS — E109 Type 1 diabetes mellitus without complications: Secondary | ICD-10-CM | POA: Insufficient documentation

## 2017-04-03 DIAGNOSIS — Z79899 Other long term (current) drug therapy: Secondary | ICD-10-CM | POA: Insufficient documentation

## 2017-04-03 DIAGNOSIS — R197 Diarrhea, unspecified: Secondary | ICD-10-CM | POA: Diagnosis not present

## 2017-04-03 DIAGNOSIS — R42 Dizziness and giddiness: Secondary | ICD-10-CM | POA: Diagnosis not present

## 2017-04-03 DIAGNOSIS — R1013 Epigastric pain: Secondary | ICD-10-CM | POA: Diagnosis not present

## 2017-04-03 DIAGNOSIS — K5792 Diverticulitis of intestine, part unspecified, without perforation or abscess without bleeding: Secondary | ICD-10-CM | POA: Diagnosis not present

## 2017-04-03 DIAGNOSIS — R1030 Lower abdominal pain, unspecified: Secondary | ICD-10-CM | POA: Diagnosis not present

## 2017-04-03 LAB — URINALYSIS, ROUTINE W REFLEX MICROSCOPIC
Bacteria, UA: NONE SEEN
Bilirubin Urine: NEGATIVE
Glucose, UA: >=500 mg/dL — AB
Hgb urine dipstick: NEGATIVE
Ketones, ur: 20 mg/dL — AB
Leukocytes, UA: NEGATIVE
Nitrite: NEGATIVE
Protein, ur: NEGATIVE mg/dL
RBC / HPF: NONE SEEN RBC/hpf (ref 0–5)
Specific Gravity, Urine: 1.003 — ABNORMAL LOW (ref 1.005–1.030)
Squamous Epithelial / LPF: NONE SEEN
pH: 6 (ref 5.0–8.0)

## 2017-04-03 LAB — DIFFERENTIAL
Basophils Absolute: 0 10*3/uL (ref 0.0–0.1)
Basophils Relative: 0 %
Eosinophils Absolute: 0.1 10*3/uL (ref 0.0–0.7)
Eosinophils Relative: 1 %
Lymphocytes Relative: 14 %
Lymphs Abs: 0.9 10*3/uL (ref 0.7–4.0)
Monocytes Absolute: 0.4 10*3/uL (ref 0.1–1.0)
Monocytes Relative: 6 %
Neutro Abs: 5.2 10*3/uL (ref 1.7–7.7)
Neutrophils Relative %: 79 %

## 2017-04-03 LAB — COMPREHENSIVE METABOLIC PANEL
ALT: 29 U/L (ref 17–63)
AST: 25 U/L (ref 15–41)
Albumin: 4.3 g/dL (ref 3.5–5.0)
Alkaline Phosphatase: 83 U/L (ref 38–126)
Anion gap: 13 (ref 5–15)
BUN: 13 mg/dL (ref 6–20)
CO2: 26 mmol/L (ref 22–32)
Calcium: 9.7 mg/dL (ref 8.9–10.3)
Chloride: 97 mmol/L — ABNORMAL LOW (ref 101–111)
Creatinine, Ser: 1.11 mg/dL (ref 0.61–1.24)
GFR calc Af Amer: >60 mL/min (ref >60)
GFR calc non Af Amer: >60 mL/min (ref >60)
Glucose, Bld: 286 mg/dL — ABNORMAL HIGH (ref 65–99)
Potassium: 3.7 mmol/L (ref 3.5–5.1)
Sodium: 136 mmol/L (ref 135–145)
Total Bilirubin: 1.1 mg/dL (ref 0.3–1.2)
Total Protein: 7.6 g/dL (ref 6.5–8.1)

## 2017-04-03 LAB — CBC
HCT: 43 % (ref 39.0–52.0)
Hemoglobin: 15 g/dL (ref 13.0–17.0)
MCH: 29.1 pg (ref 26.0–34.0)
MCHC: 34.9 g/dL (ref 30.0–36.0)
MCV: 83.3 fL (ref 78.0–100.0)
Platelets: 241 10*3/uL (ref 150–400)
RBC: 5.16 MIL/uL (ref 4.22–5.81)
RDW: 11.9 % (ref 11.5–15.5)
WBC: 6.9 10*3/uL (ref 4.0–10.5)

## 2017-04-03 LAB — LIPASE, BLOOD: Lipase: 18 U/L (ref 11–51)

## 2017-04-03 MED ORDER — PANTOPRAZOLE SODIUM 40 MG IV SOLR
40.0000 mg | Freq: Once | INTRAVENOUS | Status: AC
  Administered 2017-04-03: 40 mg via INTRAVENOUS
  Filled 2017-04-03: qty 40

## 2017-04-03 MED ORDER — SODIUM CHLORIDE 0.9 % IV BOLUS (SEPSIS)
1000.0000 mL | Freq: Once | INTRAVENOUS | Status: AC
  Administered 2017-04-03: 1000 mL via INTRAVENOUS

## 2017-04-03 NOTE — ED Triage Notes (Signed)
Pt states he was admitted over the weekend for dehydration and states he is still feeling weak and having abdominal pain; pt reports diarrhea and nausea; pt states he feels fatigued and is lightheaded

## 2017-04-03 NOTE — ED Provider Notes (Signed)
I was asked by nursing to determine disposition for this patient.  On my evaluation patient seems that he is here for generalized lightheadedness and dehydration.  He had a couple liters of fluid and seems to be significantly improved.  The previous note says he has diverticulitis with abscess I think this is unlikely as she has not had a CT scan done in the last 6 years.  I think this was likely written in error at this time the patient appears well enough for discharge.  Strict return precautions given.   Marily MemosMesner, Lajean Boese, MD 04/03/17 [phone]

## 2017-04-03 NOTE — ED Provider Notes (Addendum)
[facility]Y [facility]PARTMENT Provider Note   CSN: [id number] Arrival date & time: 04/03/17  1804     History   Chief Complaint Chief Complaint  Patient presents with  . Abdominal Pain    HPI Eric SarkJonathan D [name] is a 32 y.o. male.  Patient complains of continued diarrhea and weakness.  He was recently admitted for dehydration    The history is provided by the patient. No language interpreter was used.  Weakness  Primary symptoms include no focal weakness. This is a recurrent problem. The current episode started more than 2 days ago. The problem has not changed since onset.There was no focality noted. There has been no fever. Pertinent negatives include no shortness of breath and no chest pain. There were no medications administered prior to arrival.    Past Medical History:  Diagnosis Date  . Anxiety   . Diabetes mellitus   . Hypertension     Patient Active Problem List   Diagnosis Date Noted  . Dehydration 03/31/2017  . Fever, unspecified 03/31/2017  . Viral gastroenteritis 03/30/2017  . Sinus tachycardia 03/30/2017  . DM type 1 (diabetes mellitus, type 1) (HCC) 02/01/2011  . Low back pain 02/01/2011  . Depression 02/01/2011  . Elevated blood pressure 02/01/2011    Past Surgical History:  Procedure Laterality Date  . FRACTURE SURGERY     reconstructive left knee surgery  . KNEE SURGERY         Home Medications    Prior to Admission medications   Medication Sig Start Date End Date Taking? Authorizing Provider  albuterol (PROVENTIL HFA;VENTOLIN HFA) 108 (90 BASE) MCG/ACT inhaler Inhale 2 puffs into the lungs every 6 (six) hours as needed for wheezing or shortness of breath.    [provider]  insulin glargine (LANTUS) 100 UNIT/ML injection Inject 30 Units into the skin daily.    [provider]  insulin lispro (HUMALOG) 100 UNIT/ML injection Inject 1-10 Units into the skin 3 (three) times daily before meals. Patient taking differently:  Inject 1-10 Units into the skin continuous.  02/03/11   Elsaid, Hind I, MD  lisinopril (PRINIVIL,ZESTRIL) 10 MG tablet Take 10 mg by mouth daily.     [provider]  promethazine (PHENERGAN) 25 MG tablet Take 0.5 tablets (12.5 mg total) by mouth every 6 (six) hours as needed for nausea or vomiting. 03/31/17   Cleora FleetJohnson, Clanford L, MD    Family History Family History  Problem Relation Age of Onset  . Hypertension Mother   . Hypertension Father     Social History Social History   Tobacco Use  . Smoking status: Never Smoker  . Smokeless tobacco: Current User    Types: Snuff  Substance Use Topics  . Alcohol use: Yes    Comment: occ  . Drug use: No     Allergies   Patient has no known allergies.   Review of Systems Review of Systems  Constitutional: Negative for appetite change and fatigue.  HENT: Negative for congestion, ear discharge and sinus pressure.   Eyes: Negative for discharge.  Respiratory: Negative for cough and shortness of breath.   Cardiovascular: Negative for chest pain.  Gastrointestinal: Positive for diarrhea.  Musculoskeletal: Negative for back pain.  Skin: Negative for rash.  Neurological: Positive for weakness. Negative for focal weakness and seizures.  Psychiatric/Behavioral: Negative for hallucinations.     Physical Exam Updated Vital Signs BP (!) 147/92 (BP Location: Left Arm)   Pulse 89   Temp 99.1 F (37.3  C) (Oral)   Resp 14   Ht 5\' 9"  (1.753 m)   Wt 75.8 kg (167 lb)   SpO2 98%   BMI 24.66 kg/m   Physical Exam  Constitutional: He is oriented to person, place, and time. He appears well-developed.  HENT:  Head: Normocephalic.  Eyes: Conjunctivae and EOM are normal. No scleral icterus.  Neck: Neck supple. No thyromegaly present.  Cardiovascular: Normal rate and regular rhythm. Exam reveals no gallop and no friction rub.  No murmur heard. Pulmonary/Chest: No stridor. He has no wheezes. He has no rales. He exhibits no  tenderness.  Abdominal: He exhibits no distension. There is no rebound.  Musculoskeletal: Normal range of motion. He exhibits no edema.  Lymphadenopathy:    He has no cervical adenopathy.  Neurological: He is oriented to person, place, and time. He exhibits normal muscle tone. Coordination normal.  Skin: No rash noted. No erythema.  Psychiatric: He has a normal mood and affect. His behavior is normal.  Nursing note and vitals reviewed.    ED Treatments / Results  Labs (all labs ordered are listed, but only abnormal results are displayed) Labs Reviewed  COMPREHENSIVE METABOLIC PANEL - Abnormal; Notable for the following components:      Result Value   Chloride 97 (*)    Glucose, Bld 286 (*)    All other components within normal limits  URINALYSIS, ROUTINE W REFLEX MICROSCOPIC - Abnormal; Notable for the following components:   Color, Urine STRAW (*)    Specific Gravity, Urine 1.003 (*)    Glucose, UA >=500 (*)    Ketones, ur 20 (*)    All other components within normal limits  LIPASE, BLOOD  CBC  DIFFERENTIAL    EKG  EKG Interpretation None       Radiology Dg Abd Acute W/chest  Result Date: 04/03/2017 CLINICAL DATA:  Epigastric abdominal pain. EXAM: DG ABDOMEN ACUTE W/ 1V CHEST COMPARISON:  Radiograph of March 31, 2017. FINDINGS: There is no evidence of dilated bowel loops or free intraperitoneal air. Phleboliths are noted in the pelvis. Heart size and mediastinal contours are within normal limits. Both lungs are clear. IMPRESSION: No evidence of bowel obstruction or ileus. No acute cardiopulmonary disease. Electronically Signed   By: Lupita Raider, M.D.   On: 04/03/2017 19:35    Procedures Procedures (including critical care time)  Medications Ordered in ED Medications  sodium chloride 0.9 % bolus 1,000 mL (1,000 mLs Intravenous New Bag/Given 04/03/17 1946)  pantoprazole (PROTONIX) injection 40 mg (40 mg Intravenous Given 04/03/17 1946)  sodium chloride 0.9 %  bolus 1,000 mL (1,000 mLs Intravenous New Bag/Given 04/03/17 1911)     Initial Impression / Assessment and Plan / ED Course  I have reviewed the triage vital signs and the nursing notes.  Pertinent labs & imaging results that were available during my care of the patient were reviewed by me and considered in my medical decision making (see chart for details).   Patient with dehydration and elevated glucose.  Patient improved with fluids and will be discharged home to follow-up with PCP  Final Clinical Impressions(s) / ED Diagnoses   Final diagnoses:  Diverticulitis    ED Discharge Orders    None       Bethann Berkshire, MD 04/03/17 Wilmon Pali, MD 04/07/17 [phone]

## 2017-04-28 DIAGNOSIS — J060 Acute laryngopharyngitis: Secondary | ICD-10-CM | POA: Diagnosis not present

## 2017-04-28 DIAGNOSIS — J209 Acute bronchitis, unspecified: Secondary | ICD-10-CM | POA: Diagnosis not present

## 2017-05-27 DIAGNOSIS — Z6823 Body mass index (BMI) 23.0-23.9, adult: Secondary | ICD-10-CM | POA: Diagnosis not present

## 2017-05-27 DIAGNOSIS — E109 Type 1 diabetes mellitus without complications: Secondary | ICD-10-CM | POA: Diagnosis not present

## 2017-05-27 DIAGNOSIS — N529 Male erectile dysfunction, unspecified: Secondary | ICD-10-CM | POA: Diagnosis not present

## 2017-05-27 DIAGNOSIS — R05 Cough: Secondary | ICD-10-CM | POA: Diagnosis not present

## 2018-05-30 DIAGNOSIS — E109 Type 1 diabetes mellitus without complications: Secondary | ICD-10-CM | POA: Diagnosis not present

## 2018-06-13 DIAGNOSIS — E0590 Thyrotoxicosis, unspecified without thyrotoxic crisis or storm: Secondary | ICD-10-CM | POA: Diagnosis not present

## 2018-06-13 DIAGNOSIS — E1121 Type 2 diabetes mellitus with diabetic nephropathy: Secondary | ICD-10-CM | POA: Diagnosis not present

## 2018-06-13 DIAGNOSIS — E109 Type 1 diabetes mellitus without complications: Secondary | ICD-10-CM | POA: Diagnosis not present

## 2018-06-13 DIAGNOSIS — I10 Essential (primary) hypertension: Secondary | ICD-10-CM | POA: Diagnosis not present

## 2018-06-17 DIAGNOSIS — E1121 Type 2 diabetes mellitus with diabetic nephropathy: Secondary | ICD-10-CM | POA: Diagnosis not present

## 2018-06-17 DIAGNOSIS — E0590 Thyrotoxicosis, unspecified without thyrotoxic crisis or storm: Secondary | ICD-10-CM | POA: Diagnosis not present

## 2018-06-17 DIAGNOSIS — I10 Essential (primary) hypertension: Secondary | ICD-10-CM | POA: Diagnosis not present

## 2018-06-25 DIAGNOSIS — E109 Type 1 diabetes mellitus without complications: Secondary | ICD-10-CM | POA: Diagnosis not present

## 2018-07-06 ENCOUNTER — Other Ambulatory Visit: Payer: Self-pay

## 2018-07-06 ENCOUNTER — Emergency Department (HOSPITAL_COMMUNITY): Payer: BLUE CROSS/BLUE SHIELD

## 2018-07-06 ENCOUNTER — Emergency Department (HOSPITAL_COMMUNITY)
Admission: EM | Admit: 2018-07-06 | Discharge: 2018-07-07 | Disposition: A | Discharge status: Home / Self Care | Payer: BLUE CROSS/BLUE SHIELD | Attending: Family Medicine | Admitting: Family Medicine

## 2018-07-06 ENCOUNTER — Encounter (HOSPITAL_COMMUNITY): Payer: Self-pay | Admitting: *Deleted

## 2018-07-06 DIAGNOSIS — F17220 Nicotine dependence, chewing tobacco, uncomplicated: Secondary | ICD-10-CM | POA: Insufficient documentation

## 2018-07-06 DIAGNOSIS — I10 Essential (primary) hypertension: Secondary | ICD-10-CM | POA: Diagnosis not present

## 2018-07-06 DIAGNOSIS — R030 Elevated blood-pressure reading, without diagnosis of hypertension: Secondary | ICD-10-CM | POA: Diagnosis not present

## 2018-07-06 DIAGNOSIS — E1065 Type 1 diabetes mellitus with hyperglycemia: Secondary | ICD-10-CM | POA: Insufficient documentation

## 2018-07-06 DIAGNOSIS — R739 Hyperglycemia, unspecified: Secondary | ICD-10-CM

## 2018-07-06 LAB — CBC
HCT: 44.8 % (ref 39.0–52.0)
Hemoglobin: 16.1 g/dL (ref 13.0–17.0)
MCH: 29.8 pg (ref 26.0–34.0)
MCHC: 35.9 g/dL (ref 30.0–36.0)
MCV: 82.8 fL (ref 80.0–100.0)
Platelets: 232 10*3/uL (ref 150–400)
RBC: 5.41 MIL/uL (ref 4.22–5.81)
RDW: 12.2 % (ref 11.5–15.5)
WBC: 8.7 10*3/uL (ref 4.0–10.5)
nRBC: 0 % (ref 0.0–0.2)

## 2018-07-06 LAB — URINALYSIS, ROUTINE W REFLEX MICROSCOPIC
Bacteria, UA: NONE SEEN
Bilirubin Urine: NEGATIVE
Glucose, UA: >=500 mg/dL — AB
Hgb urine dipstick: NEGATIVE
Ketones, ur: NEGATIVE mg/dL
Leukocytes,Ua: NEGATIVE
Nitrite: NEGATIVE
Protein, ur: NEGATIVE mg/dL
Specific Gravity, Urine: 1.012 (ref 1.005–1.030)
pH: 7 (ref 5.0–8.0)

## 2018-07-06 LAB — BASIC METABOLIC PANEL
Anion gap: 10 (ref 5–15)
BUN: 17 mg/dL (ref 6–20)
CO2: 27 mmol/L (ref 22–32)
Calcium: 9.6 mg/dL (ref 8.9–10.3)
Chloride: 101 mmol/L (ref 98–111)
Creatinine, Ser: 0.86 mg/dL (ref 0.61–1.24)
GFR calc Af Amer: >60 mL/min (ref >60)
GFR calc non Af Amer: >60 mL/min (ref >60)
Glucose, Bld: 235 mg/dL — ABNORMAL HIGH (ref 70–99)
Potassium: 4 mmol/L (ref 3.5–5.1)
Sodium: 138 mmol/L (ref 135–145)

## 2018-07-06 LAB — CBG MONITORING, ED
Glucose-Capillary: 230 mg/dL — ABNORMAL HIGH (ref 70–99)
Glucose-Capillary: 62 mg/dL — ABNORMAL LOW (ref 70–99)

## 2018-07-06 MED ORDER — SODIUM CHLORIDE 0.9 % IV BOLUS: 1000.0000 mL | Freq: Once | INTRAVENOUS | Status: DC

## 2018-07-06 MED ORDER — SODIUM CHLORIDE 0.9 % IV BOLUS
2000.0000 mL | Freq: Once | INTRAVENOUS | Status: AC
  Administered 2018-07-06: 2000 mL via INTRAVENOUS

## 2018-07-06 NOTE — ED Notes (Signed)
Received report on pt, pt resting comfortable, denies any complaints, update given

## 2018-07-06 NOTE — ED Triage Notes (Signed)
Pt states that he was sent home from work due to elevated blood sugar, elevated blood pressure, and low grade fever of 99.9. pt reports that blood sugar at work 446

## 2018-07-06 NOTE — ED Provider Notes (Signed)
[facility] EMERGENCY DEPARTMENT Provider Note   CSN: [id number] Arrival date & time: 07/06/18  2029    History   Chief Complaint Chief Complaint  Patient presents with   Hyperglycemia    HPI Eric [name] is a 33 y.o. male.     HPI  Pt was seen at 2100. Per pt, c/o gradual onset and persistence of waxing and waning "high blood sugars" for the past day. Pt states his Endo MD is trying to regulate his sugars "again" after he "switched from day shift to night shift." Pt states he routinely checks his CBG, at while he was at work today approximately 1930, it was "436." Pt states he took his previously prescribed insulin dose afterwards. Pt states the nurse at work "also took my BP and it was high." Pt states his PMD changed his BP med 2 days ago "and it's been up and down." Pt states he also had temp of "99," so he was sent to the ED for evaluation. Pt denies any complaints. Denies cough, known COVID+ exposure. Denies CP/palpitations, no SOB/cough, no abd pain, no N/V/D, no rash, no focal motor weakness, no tingling/numbness in extremities, no visual changes, no ataxia, no slurred speech, no facial droop.  .     Past Medical History:  Diagnosis Date   Anxiety    Diabetes mellitus    Hypertension     Patient Active Problem List   Diagnosis Date Noted   Dehydration 03/31/2017   Fever, unspecified 03/31/2017   Viral gastroenteritis 03/30/2017   Sinus tachycardia 03/30/2017   DM type 1 (diabetes mellitus, type 1) (HCC) 02/01/2011   Low back pain 02/01/2011   Depression 02/01/2011   Elevated blood pressure 02/01/2011    Past Surgical History:  Procedure Laterality Date   FRACTURE SURGERY     reconstructive left knee surgery   KNEE SURGERY          Home Medications    Prior to Admission medications   Medication Sig Start Date End Date Taking? Authorizing Provider  insulin lispro (HUMALOG) 100 UNIT/ML injection Inject 1-10 Units into the skin 3  (three) times daily before meals. Patient taking differently: 1-10 Units 3 (three) times daily with meals. Based on sliding scale/carb intake-via pump 02/03/11  Yes Elsaid, Hind I, MD  olmesartan (BENICAR) 20 MG tablet Take 20 mg by mouth daily.   Yes [provider]  albuterol (PROVENTIL HFA;VENTOLIN HFA) 108 (90 BASE) MCG/ACT inhaler Inhale 2 puffs into the lungs every 6 (six) hours as needed for wheezing or shortness of breath.    [provider]    Family History Family History  Problem Relation Age of Onset   Hypertension Mother    Hypertension Father     Social History Social History   Tobacco Use   Smoking status: Never Smoker   Smokeless tobacco: Current User    Types: Snuff  Substance Use Topics   Alcohol use: Yes    Comment: occ   Drug use: No     Allergies   Patient has no known allergies.   Review of Systems Review of Systems ROS: Statement: All systems negative except as marked or noted in the HPI; Constitutional: Negative for fever and chills. +"high CBG and BP" ; ; Eyes: Negative for eye pain, redness and discharge. ; ; ENMT: Negative for ear pain, hoarseness, nasal congestion, sinus pressure and sore throat. ; ; Cardiovascular: Negative for chest pain, palpitations, diaphoresis, dyspnea and peripheral edema. ; ; Respiratory:  Negative for cough, wheezing and stridor. ; ; Gastrointestinal: Negative for nausea, vomiting, diarrhea, abdominal pain, blood in stool, hematemesis, jaundice and rectal bleeding. . ; ; Genitourinary: Negative for dysuria, flank pain and hematuria. ; ; Musculoskeletal: Negative for back pain and neck pain. Negative for swelling and trauma.; ; Skin: Negative for pruritus, rash, abrasions, blisters, bruising and skin lesion.; ; Neuro: Negative for headache, lightheadedness and neck stiffness. Negative for weakness, altered level of consciousness, altered mental status, extremity weakness, paresthesias, involuntary movement,  seizure and syncope.       Physical Exam Updated Vital Signs BP (!) 126/94    Pulse 95    Temp 98.2 F (36.8 C) (Oral)    Resp 16    Ht  (1.753 m)    Wt 70.3 kg    SpO2 99%    BMI 22.89 kg/m    Patient Vitals for the past 24 hrs:  BP Temp Temp src Pulse Resp SpO2 Height Weight  07/06/18 2328 (!) 126/94 98.2 F (36.8 C) Oral 95 16 99 % -- --  07/06/18 2038 (!) 145/109 98.8 F (37.1 C) Oral (!) 128 20 96 % -- --  07/06/18 2036 -- -- -- -- -- --  (1.753 m) 70.3 kg     Physical Exam 2105: Physical examination:  Nursing notes reviewed; Vital signs and O2 SAT reviewed;  Constitutional: Well developed, Well nourished, Well hydrated, In no acute distress; Head:  Normocephalic, atraumatic; Eyes: EOMI, PERRL, No scleral icterus; ENMT: Mouth and pharynx normal, Mucous membranes moist; Neck: Supple, Full range of motion, No lymphadenopathy; Cardiovascular: Tachycardic rate and rhythm, No gallop; Respiratory: Breath sounds clear & equal bilaterally, No wheezes.  Speaking full sentences with ease, Normal respiratory effort/excursion; Chest: Nontender, Movement normal; Abdomen: Soft, Nontender, Nondistended, Normal bowel sounds; Genitourinary: No CVA tenderness; Extremities: Peripheral pulses normal, No tenderness, No edema, No calf edema or asymmetry.; Neuro: AA&Ox3, Major CN grossly intact.  Speech clear. No gross focal motor or sensory deficits in extremities. Climbs on and off stretcher easily by himself. Gait steady..; Skin: Color normal, Warm, Dry.   ED Treatments / Results  Labs (all labs ordered are listed, but only abnormal results are displayed)   EKG None  Radiology   Procedures Procedures (including critical care time)  Medications Ordered in ED Medications  sodium chloride 0.9 % bolus 2,000 mL (0 mLs Intravenous Stopped 07/06/18 2312)     Initial Impression / Assessment and Plan / ED Course  I have reviewed the triage vital signs and the nursing  notes.  Pertinent labs & imaging results that were available during my care of the patient were reviewed by me and considered in my medical decision making (see chart for details).     MDM Reviewed: previous chart, nursing note and vitals Reviewed previous: labs Interpretation: labs and x-ray   Results for orders placed or performed during the hospital encounter of 07/06/18  Basic metabolic panel  Result Value Ref Range   Sodium 138 135 - 145 mmol/L   Potassium 4.0 3.5 - 5.1 mmol/L   Chloride 101 98 - 111 mmol/L   CO2 27 22 - 32 mmol/L   Glucose, Bld 235 (H) 70 - 99 mg/dL   BUN 17 6 - 20 mg/dL   Creatinine, Ser 1.61 0.61 - 1.24 mg/dL   Calcium 9.6 8.9 - 09.6 mg/dL   GFR calc non Af Amer >60 >60 mL/min   GFR calc Af Amer >60 >60 mL/min   Anion gap  10 5 - 15  CBC  Result Value Ref Range   WBC 8.7 4.0 - 10.5 K/uL   RBC 5.41 4.22 - 5.81 MIL/uL   Hemoglobin 16.1 13.0 - 17.0 g/dL   HCT 40.944.8 81.139.0 - 91.452.0 %   MCV 82.8 80.0 - 100.0 fL   MCH 29.8 26.0 - 34.0 pg   MCHC 35.9 30.0 - 36.0 g/dL   RDW 78.212.2 95.611.5 - 21.315.5 %   Platelets 232 150 - 400 K/uL   nRBC 0.0 0.0 - 0.2 %  CBG monitoring, ED  Result Value Ref Range   Glucose-Capillary 230 (H) 70 - 99 mg/dL   Dg Chest 2 View Result Date: 07/06/2018 CLINICAL DATA:  Abnormal blood sugar.  Elevated blood pressure. EXAM: CHEST - 2 VIEW COMPARISON:  04/03/2017 FINDINGS: The heart size and mediastinal contours are within normal limits. Both lungs are clear. The visualized skeletal structures are unremarkable. IMPRESSION: No active cardiopulmonary disease. Electronically Signed   By: Katherine Mantlehristopher  Green M.D.   On: 07/06/2018 21:47    Satira SarkJonathan D Wickliff was evaluated in Emergency Department on 07/06/2018 for the symptoms described in the history of present illness. He was evaluated in the context of the global COVID-19 pandemic, which necessitated consideration that the patient might be at risk for infection with the SARS-CoV-2 virus that causes  COVID-19. Institutional protocols and algorithms that pertain to the evaluation of patients at risk for COVID-19 are in a state of rapid change based on information released by regulatory bodies including the CDC and federal and state organizations. These policies and algorithms were followed during the patient's care in the ED.    2330:  IVF given with improvement in HR. BP has spontaneously improved. CBG mildy elevated but AG normal. Udip pending. Sign out to Dr. Manus Gunningancour.     Final Clinical Impressions(s) / ED Diagnoses   Final diagnoses:  None    ED Discharge Orders    None       Samuel JesterMcManus, Nova Schmuhl, DO 07/06/18 2335

## 2018-07-07 NOTE — ED Notes (Signed)
Dr Manus Gunning aware of blood sugar of 62, pt given crackers and water per request

## 2018-07-07 NOTE — ED Notes (Signed)
Pt's blood sugar 159, Dr Manus Gunning notified,

## 2018-07-07 NOTE — ED Provider Notes (Signed)
Care assumed from Dr. Clarene Duke at shift change.  Type I diabetic with insulin pump presenting with hyperglycemia.  He is not in DKA.  He was given 2 L of fluid.  He is awaiting urinalysis.  Urinalysis is negative.  No ketones.  No evidence of DKA.  On recheck blood sugar is 62.  Patient is given crackers.  States he missed dinner.  After receiving p.o. fluid, sugar has improved to 159.  Patient feels well and is requesting discharge.  States he has adequate prescriptions.  Follow-up with his PCP and endocrinologist.  Return precautions discussed.  BP (!) 141/90   Pulse (!) 103   Temp 98.2 F (36.8 C) (Oral)   Resp 14   Ht 5\' 9"  (1.753 m)   Wt 70.3 kg   SpO2 100%   BMI 22.89 kg/m     Eric Octave, MD 07/07/18 0102

## 2018-07-07 NOTE — ED Notes (Signed)
Pt's blood sugar reading 70 per pt's device, ed blood sugar machine was the same range on previous blood sugar check, per Dr Manus Gunning nursing may use pt's device to obtain blood sugars,

## 2018-07-07 NOTE — Discharge Instructions (Signed)
Follow-up with your diabetes doctor for further adjustments of your insulin regimen.  Return to the ED with fever, abdominal pain, vomiting, confusion or any other concerns.

## 2018-07-11 ENCOUNTER — Other Ambulatory Visit (HOSPITAL_COMMUNITY): Payer: Self-pay | Admitting: Endocrinology

## 2018-07-11 DIAGNOSIS — E0590 Thyrotoxicosis, unspecified without thyrotoxic crisis or storm: Secondary | ICD-10-CM

## 2018-07-11 DIAGNOSIS — I10 Essential (primary) hypertension: Secondary | ICD-10-CM | POA: Diagnosis not present

## 2018-07-11 DIAGNOSIS — E109 Type 1 diabetes mellitus without complications: Secondary | ICD-10-CM | POA: Diagnosis not present

## 2018-07-21 DIAGNOSIS — E109 Type 1 diabetes mellitus without complications: Secondary | ICD-10-CM | POA: Diagnosis not present

## 2018-07-23 ENCOUNTER — Encounter (HOSPITAL_COMMUNITY)
Admission: RE | Admit: 2018-07-23 | Discharge: 2018-07-23 | Disposition: A | Discharge status: Home / Self Care | Payer: BC Managed Care – PPO | Source: Ambulatory Visit | Attending: Endocrinology | Admitting: Endocrinology

## 2018-07-23 ENCOUNTER — Other Ambulatory Visit: Payer: Self-pay

## 2018-07-23 DIAGNOSIS — E0590 Thyrotoxicosis, unspecified without thyrotoxic crisis or storm: Secondary | ICD-10-CM | POA: Diagnosis not present

## 2018-07-23 MED ORDER — SODIUM IODIDE I-123 7.4 MBQ CAPS
291.0000 | ORAL_CAPSULE | Freq: Once | ORAL | Status: AC
  Administered 2018-07-23: 291 via ORAL

## 2018-07-23 MED ORDER — SODIUM IODIDE I-123 7.4 MBQ CAPS
139.0000 | ORAL_CAPSULE | Freq: Once | ORAL | Status: AC
  Administered 2018-07-23: 139 via ORAL

## 2018-07-24 ENCOUNTER — Encounter (HOSPITAL_COMMUNITY)
Admission: RE | Admit: 2018-07-24 | Discharge: 2018-07-24 | Disposition: A | Discharge status: Home / Self Care | Payer: BC Managed Care – PPO | Source: Ambulatory Visit | Attending: Endocrinology | Admitting: Endocrinology

## 2018-07-24 DIAGNOSIS — I10 Essential (primary) hypertension: Secondary | ICD-10-CM | POA: Diagnosis not present

## 2018-07-24 DIAGNOSIS — R634 Abnormal weight loss: Secondary | ICD-10-CM | POA: Diagnosis not present

## 2018-07-24 DIAGNOSIS — R000 Tachycardia, unspecified: Secondary | ICD-10-CM | POA: Diagnosis not present

## 2018-07-24 DIAGNOSIS — E0500 Thyrotoxicosis with diffuse goiter without thyrotoxic crisis or storm: Secondary | ICD-10-CM | POA: Diagnosis not present

## 2018-07-25 DIAGNOSIS — J209 Acute bronchitis, unspecified: Secondary | ICD-10-CM | POA: Diagnosis not present

## 2018-07-25 DIAGNOSIS — E109 Type 1 diabetes mellitus without complications: Secondary | ICD-10-CM | POA: Diagnosis not present

## 2018-07-25 DIAGNOSIS — J060 Acute laryngopharyngitis: Secondary | ICD-10-CM | POA: Diagnosis not present

## 2018-07-25 DIAGNOSIS — J069 Acute upper respiratory infection, unspecified: Secondary | ICD-10-CM | POA: Diagnosis not present

## 2018-07-25 DIAGNOSIS — N529 Male erectile dysfunction, unspecified: Secondary | ICD-10-CM | POA: Diagnosis not present

## 2018-07-28 DIAGNOSIS — E782 Mixed hyperlipidemia: Secondary | ICD-10-CM | POA: Diagnosis not present

## 2018-07-28 DIAGNOSIS — E1065 Type 1 diabetes mellitus with hyperglycemia: Secondary | ICD-10-CM | POA: Diagnosis not present

## 2018-07-28 DIAGNOSIS — R809 Proteinuria, unspecified: Secondary | ICD-10-CM | POA: Diagnosis not present

## 2018-07-28 DIAGNOSIS — Z0001 Encounter for general adult medical examination with abnormal findings: Secondary | ICD-10-CM | POA: Diagnosis not present

## 2018-08-12 DIAGNOSIS — M545 Low back pain: Secondary | ICD-10-CM | POA: Diagnosis not present

## 2018-08-12 DIAGNOSIS — M5440 Lumbago with sciatica, unspecified side: Secondary | ICD-10-CM | POA: Diagnosis not present

## 2018-08-13 ENCOUNTER — Other Ambulatory Visit (HOSPITAL_COMMUNITY): Payer: Self-pay | Admitting: Internal Medicine

## 2018-08-13 ENCOUNTER — Other Ambulatory Visit: Payer: Self-pay | Admitting: Internal Medicine

## 2018-08-13 DIAGNOSIS — M545 Low back pain, unspecified: Secondary | ICD-10-CM

## 2018-08-22 ENCOUNTER — Ambulatory Visit (HOSPITAL_COMMUNITY)
Admission: RE | Admit: 2018-08-22 | Discharge: 2018-08-22 | Disposition: A | Discharge status: Home / Self Care | Payer: BC Managed Care – PPO | Source: Ambulatory Visit | Attending: Internal Medicine | Admitting: Internal Medicine

## 2018-08-22 ENCOUNTER — Other Ambulatory Visit: Payer: Self-pay

## 2018-08-22 DIAGNOSIS — M545 Low back pain, unspecified: Secondary | ICD-10-CM

## 2018-08-22 DIAGNOSIS — M5126 Other intervertebral disc displacement, lumbar region: Secondary | ICD-10-CM | POA: Diagnosis not present

## 2018-08-22 DIAGNOSIS — M5136 Other intervertebral disc degeneration, lumbar region: Secondary | ICD-10-CM | POA: Diagnosis not present

## 2018-08-27 ENCOUNTER — Emergency Department (HOSPITAL_COMMUNITY)
Admission: EM | Admit: 2018-08-27 | Discharge: 2018-08-27 | Discharge status: Against Medical Advice | Payer: BC Managed Care – PPO

## 2018-08-27 ENCOUNTER — Other Ambulatory Visit: Payer: Self-pay

## 2018-08-27 NOTE — ED Notes (Signed)
Patient called x3 with no answer. 

## 2018-08-27 NOTE — ED Notes (Signed)
Called x 1 with no answer. 

## 2018-08-28 DIAGNOSIS — S81801A Unspecified open wound, right lower leg, initial encounter: Secondary | ICD-10-CM | POA: Diagnosis not present

## 2018-08-28 DIAGNOSIS — M545 Low back pain: Secondary | ICD-10-CM | POA: Diagnosis not present

## 2018-08-28 DIAGNOSIS — M5440 Lumbago with sciatica, unspecified side: Secondary | ICD-10-CM | POA: Diagnosis not present

## 2018-09-15 DIAGNOSIS — S81801D Unspecified open wound, right lower leg, subsequent encounter: Secondary | ICD-10-CM | POA: Diagnosis not present

## 2018-09-19 DIAGNOSIS — M9902 Segmental and somatic dysfunction of thoracic region: Secondary | ICD-10-CM | POA: Diagnosis not present

## 2018-09-19 DIAGNOSIS — M9905 Segmental and somatic dysfunction of pelvic region: Secondary | ICD-10-CM | POA: Diagnosis not present

## 2018-09-19 DIAGNOSIS — M9903 Segmental and somatic dysfunction of lumbar region: Secondary | ICD-10-CM | POA: Diagnosis not present

## 2018-09-19 DIAGNOSIS — M5441 Lumbago with sciatica, right side: Secondary | ICD-10-CM | POA: Diagnosis not present

## 2018-09-24 DIAGNOSIS — M5441 Lumbago with sciatica, right side: Secondary | ICD-10-CM | POA: Diagnosis not present

## 2018-09-24 DIAGNOSIS — M9905 Segmental and somatic dysfunction of pelvic region: Secondary | ICD-10-CM | POA: Diagnosis not present

## 2018-09-24 DIAGNOSIS — M9903 Segmental and somatic dysfunction of lumbar region: Secondary | ICD-10-CM | POA: Diagnosis not present

## 2018-09-24 DIAGNOSIS — M9902 Segmental and somatic dysfunction of thoracic region: Secondary | ICD-10-CM | POA: Diagnosis not present

## 2018-09-26 DIAGNOSIS — F321 Major depressive disorder, single episode, moderate: Secondary | ICD-10-CM | POA: Diagnosis not present

## 2018-09-26 DIAGNOSIS — F411 Generalized anxiety disorder: Secondary | ICD-10-CM | POA: Diagnosis not present

## 2018-09-26 DIAGNOSIS — M545 Low back pain: Secondary | ICD-10-CM | POA: Diagnosis not present

## 2018-10-21 DIAGNOSIS — M5416 Radiculopathy, lumbar region: Secondary | ICD-10-CM | POA: Diagnosis not present

## 2018-10-21 DIAGNOSIS — M456 Ankylosing spondylitis lumbar region: Secondary | ICD-10-CM | POA: Diagnosis not present

## 2018-10-21 DIAGNOSIS — M47896 Other spondylosis, lumbar region: Secondary | ICD-10-CM | POA: Diagnosis not present

## 2018-10-21 DIAGNOSIS — M545 Low back pain: Secondary | ICD-10-CM | POA: Diagnosis not present

## 2018-10-24 DIAGNOSIS — F411 Generalized anxiety disorder: Secondary | ICD-10-CM | POA: Diagnosis not present

## 2018-10-24 DIAGNOSIS — F321 Major depressive disorder, single episode, moderate: Secondary | ICD-10-CM | POA: Diagnosis not present

## 2018-10-24 DIAGNOSIS — M545 Low back pain: Secondary | ICD-10-CM | POA: Diagnosis not present

## 2018-10-28 DIAGNOSIS — E109 Type 1 diabetes mellitus without complications: Secondary | ICD-10-CM | POA: Diagnosis not present

## 2018-10-28 DIAGNOSIS — Z794 Long term (current) use of insulin: Secondary | ICD-10-CM | POA: Diagnosis not present

## 2018-10-31 DIAGNOSIS — E0590 Thyrotoxicosis, unspecified without thyrotoxic crisis or storm: Secondary | ICD-10-CM | POA: Diagnosis not present

## 2018-10-31 DIAGNOSIS — E109 Type 1 diabetes mellitus without complications: Secondary | ICD-10-CM | POA: Diagnosis not present

## 2018-10-31 DIAGNOSIS — I10 Essential (primary) hypertension: Secondary | ICD-10-CM | POA: Diagnosis not present

## 2018-11-03 DIAGNOSIS — M5416 Radiculopathy, lumbar region: Secondary | ICD-10-CM | POA: Diagnosis not present

## 2018-11-12 DIAGNOSIS — M546 Pain in thoracic spine: Secondary | ICD-10-CM | POA: Diagnosis not present

## 2018-11-12 DIAGNOSIS — Z6824 Body mass index (BMI) 24.0-24.9, adult: Secondary | ICD-10-CM | POA: Diagnosis not present

## 2018-11-12 DIAGNOSIS — M5416 Radiculopathy, lumbar region: Secondary | ICD-10-CM | POA: Diagnosis not present

## 2018-11-12 DIAGNOSIS — M545 Low back pain: Secondary | ICD-10-CM | POA: Diagnosis not present

## 2018-11-17 DIAGNOSIS — M5416 Radiculopathy, lumbar region: Secondary | ICD-10-CM | POA: Diagnosis not present

## 2018-11-29 ENCOUNTER — Emergency Department (HOSPITAL_COMMUNITY): Payer: BC Managed Care – PPO

## 2018-11-29 ENCOUNTER — Encounter (HOSPITAL_COMMUNITY): Payer: Self-pay | Admitting: Emergency Medicine

## 2018-11-29 ENCOUNTER — Other Ambulatory Visit: Payer: Self-pay

## 2018-11-29 ENCOUNTER — Emergency Department (HOSPITAL_COMMUNITY)
Admission: EM | Admit: 2018-11-29 | Discharge: 2018-11-29 | Disposition: A | Discharge status: Home / Self Care | Payer: BC Managed Care – PPO | Attending: Emergency Medicine | Admitting: Emergency Medicine

## 2018-11-29 DIAGNOSIS — Z9641 Presence of insulin pump (external) (internal): Secondary | ICD-10-CM | POA: Diagnosis not present

## 2018-11-29 DIAGNOSIS — I10 Essential (primary) hypertension: Secondary | ICD-10-CM | POA: Diagnosis not present

## 2018-11-29 DIAGNOSIS — R109 Unspecified abdominal pain: Secondary | ICD-10-CM | POA: Diagnosis not present

## 2018-11-29 DIAGNOSIS — E119 Type 2 diabetes mellitus without complications: Secondary | ICD-10-CM | POA: Insufficient documentation

## 2018-11-29 DIAGNOSIS — Z794 Long term (current) use of insulin: Secondary | ICD-10-CM | POA: Insufficient documentation

## 2018-11-29 DIAGNOSIS — Z79899 Other long term (current) drug therapy: Secondary | ICD-10-CM | POA: Diagnosis not present

## 2018-11-29 DIAGNOSIS — R1011 Right upper quadrant pain: Secondary | ICD-10-CM | POA: Diagnosis not present

## 2018-11-29 LAB — COMPREHENSIVE METABOLIC PANEL
ALT: 21 U/L (ref 0–44)
AST: 18 U/L (ref 15–41)
Albumin: 4.5 g/dL (ref 3.5–5.0)
Alkaline Phosphatase: 52 U/L (ref 38–126)
Anion gap: 8 (ref 5–15)
BUN: 11 mg/dL (ref 6–20)
CO2: 29 mmol/L (ref 22–32)
Calcium: 9.1 mg/dL (ref 8.9–10.3)
Chloride: 103 mmol/L (ref 98–111)
Creatinine, Ser: 0.88 mg/dL (ref 0.61–1.24)
GFR calc Af Amer: >60 mL/min (ref >60)
GFR calc non Af Amer: >60 mL/min (ref >60)
Glucose, Bld: 165 mg/dL — ABNORMAL HIGH (ref 70–99)
Potassium: 3.9 mmol/L (ref 3.5–5.1)
Sodium: 140 mmol/L (ref 135–145)
Total Bilirubin: 0.9 mg/dL (ref 0.3–1.2)
Total Protein: 7 g/dL (ref 6.5–8.1)

## 2018-11-29 LAB — CBC WITH DIFFERENTIAL/PLATELET
Abs Immature Granulocytes: 0.01 10*3/uL (ref 0.00–0.07)
Basophils Absolute: 0 10*3/uL (ref 0.0–0.1)
Basophils Relative: 1 %
Eosinophils Absolute: 0.1 10*3/uL (ref 0.0–0.5)
Eosinophils Relative: 2 %
HCT: 44 % (ref 39.0–52.0)
Hemoglobin: 15 g/dL (ref 13.0–17.0)
Immature Granulocytes: 0 %
Lymphocytes Relative: 16 %
Lymphs Abs: 1 10*3/uL (ref 0.7–4.0)
MCH: 29 pg (ref 26.0–34.0)
MCHC: 34.1 g/dL (ref 30.0–36.0)
MCV: 85.1 fL (ref 80.0–100.0)
Monocytes Absolute: 0.4 10*3/uL (ref 0.1–1.0)
Monocytes Relative: 6 %
Neutro Abs: 4.7 10*3/uL (ref 1.7–7.7)
Neutrophils Relative %: 75 %
Platelets: 202 10*3/uL (ref 150–400)
RBC: 5.17 MIL/uL (ref 4.22–5.81)
RDW: 12.1 % (ref 11.5–15.5)
WBC: 6.3 10*3/uL (ref 4.0–10.5)
nRBC: 0 % (ref 0.0–0.2)

## 2018-11-29 LAB — URINALYSIS, ROUTINE W REFLEX MICROSCOPIC
Bilirubin Urine: NEGATIVE
Glucose, UA: 50 mg/dL — AB
Hgb urine dipstick: NEGATIVE
Ketones, ur: 5 mg/dL — AB
Leukocytes,Ua: NEGATIVE
Nitrite: NEGATIVE
Protein, ur: 100 mg/dL — AB
Specific Gravity, Urine: 1.026 (ref 1.005–1.030)
pH: 5 (ref 5.0–8.0)

## 2018-11-29 LAB — LIPASE, BLOOD: Lipase: 16 U/L (ref 11–51)

## 2018-11-29 LAB — CBG MONITORING, ED: Glucose-Capillary: 155 mg/dL — ABNORMAL HIGH (ref 70–99)

## 2018-11-29 MED ORDER — METHOCARBAMOL 750 MG PO TABS: 750.0000 mg | ORAL_TABLET | Freq: Three times a day (TID) | ORAL | 0 refills | Status: DC | PRN

## 2018-11-29 MED ORDER — IBUPROFEN 600 MG PO TABS: 600.0000 mg | ORAL_TABLET | Freq: Four times a day (QID) | ORAL | 0 refills | Status: DC | PRN

## 2018-11-29 MED ORDER — MORPHINE SULFATE (PF) 4 MG/ML IV SOLN
4.0000 mg | Freq: Once | INTRAVENOUS | Status: AC
  Administered 2018-11-29: 15:00:00 4 mg via INTRAVENOUS
  Filled 2018-11-29: qty 1

## 2018-11-29 MED ORDER — SODIUM CHLORIDE 0.9 % IV BOLUS
1000.0000 mL | Freq: Once | INTRAVENOUS | Status: AC
  Administered 2018-11-29: 1000 mL via INTRAVENOUS

## 2018-11-29 MED ORDER — MORPHINE SULFATE (PF) 4 MG/ML IV SOLN
4.0000 mg | Freq: Once | INTRAVENOUS | Status: AC
  Administered 2018-11-29: 4 mg via INTRAVENOUS
  Filled 2018-11-29: qty 1

## 2018-11-29 MED ORDER — ONDANSETRON HCL 4 MG/2ML IJ SOLN
4.0000 mg | Freq: Once | INTRAMUSCULAR | Status: AC
  Administered 2018-11-29: 4 mg via INTRAVENOUS
  Filled 2018-11-29: qty 2

## 2018-11-29 NOTE — ED Provider Notes (Signed)
[facility] EMERGENCY DEPARTMENT Provider Note   CSN: [id number] Arrival date & time: 11/29/18  1118     History   Chief Complaint Chief Complaint  Patient presents with   Abdominal Pain    HPI Eric [name] is a 33 y.o. male.     The history is provided by the patient.  Abdominal Pain Pain location:  R flank and RUQ Pain quality: aching and stabbing   Pain quality comment:  Pt had aching pain in his right flank yesterday, woke today with sharp pain radiating into the RUQ Pain severity:  Severe Onset quality:  Gradual Duration:  1 day Timing:  Constant Progression:  Worsening Chronicity:  New Context: not alcohol use, not diet changes, not eating, not previous surgeries and not retching   Context comment:  No hx of kidney stones.  denies dysuria or hematuria.  Relieved by:  None tried Worsened by:  Position changes (worse when bends forward - RUQ feels tight) Ineffective treatments:  None tried Associated symptoms: nausea   Associated symptoms: no anorexia, no chest pain, no chills, no constipation, no cough, no diarrhea, no dysuria, no fever, no hematuria, no shortness of breath, no sore throat and no vomiting     Past Medical History:  Diagnosis Date   Anxiety    Diabetes mellitus    Hypertension     Patient Active Problem List   Diagnosis Date Noted   Dehydration 03/31/2017   Fever, unspecified 03/31/2017   Viral gastroenteritis 03/30/2017   Sinus tachycardia 03/30/2017   DM type 1 (diabetes mellitus, type 1) (HCC) 02/01/2011   Low back pain 02/01/2011   Depression 02/01/2011   Elevated blood pressure 02/01/2011    Past Surgical History:  Procedure Laterality Date   FRACTURE SURGERY     reconstructive left knee surgery   KNEE SURGERY          Home Medications    Prior to Admission medications   Medication Sig Start Date End Date Taking? Authorizing Provider  diazepam (VALIUM) 5 MG tablet Take 5 mg by mouth 2 (two) times  daily as needed for muscle spasms.  11/19/18  Yes [provider]  gabapentin (NEURONTIN) 300 MG capsule Take 300 mg by mouth 3 (three) times daily.  09/26/18  Yes [provider]  HYDROcodone-acetaminophen (NORCO/VICODIN) 5-325 MG tablet Take 1 tablet by mouth every 4 (four) hours as needed for moderate pain.  11/28/18  Yes [provider]  insulin lispro (HUMALOG) 100 UNIT/ML injection Inject 1-10 Units into the skin 3 (three) times daily before meals. Patient taking differently: 1-10 Units 3 (three) times daily with meals. Based on sliding scale/carb intake-via pump 02/03/11  Yes Elsaid, Hind I, MD  albuterol (PROVENTIL HFA;VENTOLIN HFA) 108 (90 BASE) MCG/ACT inhaler Inhale 2 puffs into the lungs every 6 (six) hours as needed for wheezing or shortness of breath.    [provider]  ibuprofen (ADVIL) 600 MG tablet Take 1 tablet (600 mg total) by mouth every 6 (six) hours as needed. 11/29/18   Burgess Amor, PA-C  methocarbamol (ROBAXIN-750) 750 MG tablet Take 1 tablet (750 mg total) by mouth every 8 (eight) hours as needed for muscle spasms. 11/29/18   Burgess Amor, PA-C    Family History Family History  Problem Relation Age of Onset   Hypertension Mother    Hypertension Father     Social History Social History   Tobacco Use   Smoking status: Never Smoker   Smokeless tobacco: Current User  Types: Snuff  Substance Use Topics   Alcohol use: Yes    Comment: occ   Drug use: No     Allergies   Patient has no known allergies.   Review of Systems Review of Systems  Constitutional: Negative for chills and fever.  HENT: Negative for congestion and sore throat.   Eyes: Negative.   Respiratory: Negative for cough, chest tightness and shortness of breath.   Cardiovascular: Negative for chest pain.  Gastrointestinal: Positive for abdominal pain and nausea. Negative for anorexia, constipation, diarrhea and vomiting.  Genitourinary: Positive for  flank pain. Negative for dysuria, hematuria and urgency.  Musculoskeletal: Negative for arthralgias, joint swelling and neck pain.  Skin: Negative.  Negative for rash and wound.  Neurological: Negative for dizziness, weakness, light-headedness, numbness and headaches.  Psychiatric/Behavioral: Negative.      Physical Exam Updated Vital Signs BP (!) 151/100 (BP Location: Right Arm)    Pulse (!) 106    Temp 98.5 F (36.9 C) (Oral)    Resp 18    Ht 5\' 9"  (1.753 m)    Wt 75.8 kg    SpO2 98%    BMI 24.66 kg/m   Physical Exam Vitals signs and nursing note reviewed.  Constitutional:      Appearance: He is well-developed.  HENT:     Head: Normocephalic and atraumatic.  Eyes:     Conjunctiva/sclera: Conjunctivae normal.  Neck:     Musculoskeletal: Normal range of motion.  Cardiovascular:     Rate and Rhythm: Normal rate and regular rhythm.     Heart sounds: Normal heart sounds.  Pulmonary:     Effort: Pulmonary effort is normal. No respiratory distress.     Breath sounds: Normal breath sounds. No wheezing or rhonchi.  Chest:     Chest wall: No tenderness.  Abdominal:     General: Bowel sounds are normal. There is no distension.     Palpations: Abdomen is soft. There is no mass.     Tenderness: There is abdominal tenderness in the right upper quadrant. There is right CVA tenderness. There is no guarding. Left CVA tenderness: insulin pump insertion right mid abdomen, no erythema, soft.  Negative signs include Murphy's sign.  Musculoskeletal: Normal range of motion.  Skin:    General: Skin is warm and dry.  Neurological:     Mental Status: He is alert.      ED Treatments / Results  Labs (all labs ordered are listed, but only abnormal results are displayed) Labs Reviewed  COMPREHENSIVE METABOLIC PANEL - Abnormal; Notable for the following components:      Result Value   Glucose, Bld 165 (*)    All other components within normal limits  URINALYSIS, ROUTINE W REFLEX MICROSCOPIC -  Abnormal; Notable for the following components:   Glucose, UA 50 (*)    Ketones, ur 5 (*)    Protein, ur 100 (*)    Bacteria, UA RARE (*)    All other components within normal limits  CBG MONITORING, ED - Abnormal; Notable for the following components:   Glucose-Capillary 155 (*)    All other components within normal limits  CBC WITH DIFFERENTIAL/PLATELET  LIPASE, BLOOD    EKG None  Radiology Ct Renal Stone Study  Result Date: 11/29/2018 CLINICAL DATA:  Right flank pain, nausea EXAM: CT ABDOMEN AND PELVIS WITHOUT CONTRAST TECHNIQUE: Multidetector CT imaging of the abdomen and pelvis was performed following the standard protocol without IV contrast. COMPARISON:  02/02/2011 FINDINGS: Lower chest:  Lung bases are clear. Hepatobiliary: Unenhanced liver is unremarkable. Gallbladder is unremarkable. No intrahepatic or extrahepatic ductal dilatation. Pancreas: Within normal limits. Spleen: Within limits. Adrenals/Urinary Tract: Adrenal glands are within normal limits. Kidneys are within normal limits. No renal, ureteral, or bladder calculi. No hydronephrosis. Bladder is mildly thick-walled although underdistended. Stomach/Bowel: Stomach is within normal limits. No evidence of bowel obstruction. Normal appendix (series 2/image 45). No colonic wall thickening or inflammatory changes. Vascular/Lymphatic: No evidence of abdominal aortic aneurysm. No suspicious abdominopelvic lymphadenopathy. Reproductive: Prostate is unremarkable. Other: No abdominopelvic ascites. Musculoskeletal: Visualized osseous structures are within normal limits. IMPRESSION: No renal, ureteral, or bladder calculi. No hydronephrosis. No evidence of bowel obstruction. Normal appendix. No CT findings to account for the patient's right flank pain. Electronically Signed   By: Charline BillsSriyesh  Krishnan M.D.   On: 11/29/2018 13:01    Procedures Procedures (including critical care time)  Medications Ordered in ED Medications  morphine 4 MG/ML  injection 4 mg (has no administration in time range)  morphine 4 MG/ML injection 4 mg (4 mg Intravenous Given 11/29/18 1156)  ondansetron (ZOFRAN) injection 4 mg (4 mg Intravenous Given 11/29/18 1154)  sodium chloride 0.9 % bolus 1,000 mL (0 mLs Intravenous Stopped 11/29/18 1444)     Initial Impression / Assessment and Plan / ED Course  I have reviewed the triage vital signs and the nursing notes.  Pertinent labs & imaging results that were available during my care of the patient were reviewed by me and considered in my medical decision making (see chart for details).        Pt with normal labs and Ct imaging,  Ct imaging ruled out renal stones/pyelonephritis, also normal appendix and lung bases clear.  Pain is worsened with forward flexion, suggesting possible body wall source.  Ibuprofen and robaxin prescribed, heat tx discussed. Plan f/u with pcp and/or recheck here for any new or worsened sx.   Discussed with Dr. Hyacinth MeekerMiller prior to dc home.  The patient appears reasonably screened and/or stabilized for discharge and I doubt any other medical condition or other [facility] requiring further screening, evaluation, or treatment in the ED at this time prior to discharge.   Final Clinical Impressions(s) / ED Diagnoses   Final diagnoses:  Flank pain    ED Discharge Orders         Ordered    ibuprofen (ADVIL) 600 MG tablet  Every 6 hours PRN     11/29/18 1457    methocarbamol (ROBAXIN-750) 750 MG tablet  Every 8 hours PRN     11/29/18 1457           Burgess Amordol, Camari Quintanilla, PA-C 11/29/18 1503    Eber HongMiller, Brian, MD 11/30/18 [phone]

## 2018-11-29 NOTE — ED Triage Notes (Signed)
Patient complains of abdominal pain and nausea. States pain goes from right rib cage down to right flank. Denies nausea\v\d.

## 2018-11-29 NOTE — Discharge Instructions (Addendum)
Your lab tests and CT imaging are negative today with no explanation for the source of your pain.  As your pain is worsened with movement, it is possible this is muscle or body wall pain.  Take the medicines prescribed for your pain symptoms.  I also recommend a heating pad for 20 minutes several times daily applied to the site.  See your MD for a recheck within a week, recheck here sooner for any worsened or new symptoms.

## 2018-12-01 DIAGNOSIS — M47896 Other spondylosis, lumbar region: Secondary | ICD-10-CM | POA: Diagnosis not present

## 2018-12-01 DIAGNOSIS — M5416 Radiculopathy, lumbar region: Secondary | ICD-10-CM | POA: Diagnosis not present

## 2018-12-01 DIAGNOSIS — M545 Low back pain: Secondary | ICD-10-CM | POA: Diagnosis not present

## 2018-12-01 DIAGNOSIS — M546 Pain in thoracic spine: Secondary | ICD-10-CM | POA: Diagnosis not present

## 2018-12-02 ENCOUNTER — Other Ambulatory Visit: Payer: Self-pay | Admitting: Rehabilitation

## 2018-12-02 DIAGNOSIS — M546 Pain in thoracic spine: Secondary | ICD-10-CM

## 2018-12-03 ENCOUNTER — Other Ambulatory Visit (HOSPITAL_COMMUNITY): Payer: Self-pay | Admitting: Rehabilitation

## 2018-12-03 DIAGNOSIS — M546 Pain in thoracic spine: Secondary | ICD-10-CM

## 2018-12-05 ENCOUNTER — Emergency Department (HOSPITAL_COMMUNITY): Payer: BC Managed Care – PPO

## 2018-12-05 ENCOUNTER — Other Ambulatory Visit: Payer: Self-pay

## 2018-12-05 ENCOUNTER — Emergency Department (HOSPITAL_COMMUNITY)
Admission: EM | Admit: 2018-12-05 | Discharge: 2018-12-05 | Disposition: A | Discharge status: Home / Self Care | Payer: BC Managed Care – PPO | Attending: Emergency Medicine | Admitting: Emergency Medicine

## 2018-12-05 ENCOUNTER — Encounter (HOSPITAL_COMMUNITY): Payer: Self-pay | Admitting: *Deleted

## 2018-12-05 DIAGNOSIS — Z5321 Procedure and treatment not carried out due to patient leaving prior to being seen by health care provider: Secondary | ICD-10-CM | POA: Insufficient documentation

## 2018-12-05 DIAGNOSIS — R0789 Other chest pain: Secondary | ICD-10-CM | POA: Diagnosis not present

## 2018-12-05 LAB — CBG MONITORING, ED: Glucose-Capillary: 369 mg/dL — ABNORMAL HIGH (ref 70–99)

## 2018-12-05 NOTE — ED Notes (Signed)
Pt states the muscle relaxers worked but has ran out.

## 2018-12-05 NOTE — ED Triage Notes (Signed)
Pt c/o right rib pain, pt thinks it could be a "pulled muscle".  Pt sees a spine specialist and seen recently.

## 2018-12-05 NOTE — ED Notes (Signed)
Registration informed triage nurse that pt left at 2102

## 2018-12-09 ENCOUNTER — Ambulatory Visit (HOSPITAL_COMMUNITY)
Admission: RE | Admit: 2018-12-09 | Discharge: 2018-12-09 | Disposition: A | Discharge status: Home / Self Care | Payer: BC Managed Care – PPO | Source: Ambulatory Visit | Attending: Rehabilitation | Admitting: Rehabilitation

## 2018-12-09 ENCOUNTER — Other Ambulatory Visit: Payer: Self-pay

## 2018-12-09 DIAGNOSIS — M546 Pain in thoracic spine: Secondary | ICD-10-CM | POA: Diagnosis not present

## 2018-12-23 DIAGNOSIS — M47896 Other spondylosis, lumbar region: Secondary | ICD-10-CM | POA: Diagnosis not present

## 2018-12-23 DIAGNOSIS — M5416 Radiculopathy, lumbar region: Secondary | ICD-10-CM | POA: Diagnosis not present

## 2018-12-23 DIAGNOSIS — M546 Pain in thoracic spine: Secondary | ICD-10-CM | POA: Diagnosis not present

## 2019-01-13 DIAGNOSIS — E109 Type 1 diabetes mellitus without complications: Secondary | ICD-10-CM | POA: Diagnosis not present

## 2019-01-13 DIAGNOSIS — Z794 Long term (current) use of insulin: Secondary | ICD-10-CM | POA: Diagnosis not present

## 2019-01-16 DIAGNOSIS — M79605 Pain in left leg: Secondary | ICD-10-CM | POA: Diagnosis not present

## 2019-01-16 DIAGNOSIS — M545 Low back pain: Secondary | ICD-10-CM | POA: Diagnosis not present

## 2019-01-16 DIAGNOSIS — M6281 Muscle weakness (generalized): Secondary | ICD-10-CM | POA: Diagnosis not present

## 2019-01-16 DIAGNOSIS — M79604 Pain in right leg: Secondary | ICD-10-CM | POA: Diagnosis not present

## 2019-01-20 DIAGNOSIS — M545 Low back pain: Secondary | ICD-10-CM | POA: Diagnosis not present

## 2019-01-20 DIAGNOSIS — M6281 Muscle weakness (generalized): Secondary | ICD-10-CM | POA: Diagnosis not present

## 2019-01-20 DIAGNOSIS — M79604 Pain in right leg: Secondary | ICD-10-CM | POA: Diagnosis not present

## 2019-01-20 DIAGNOSIS — M79605 Pain in left leg: Secondary | ICD-10-CM | POA: Diagnosis not present

## 2019-01-22 DIAGNOSIS — M79605 Pain in left leg: Secondary | ICD-10-CM | POA: Diagnosis not present

## 2019-01-22 DIAGNOSIS — M79604 Pain in right leg: Secondary | ICD-10-CM | POA: Diagnosis not present

## 2019-01-22 DIAGNOSIS — M545 Low back pain: Secondary | ICD-10-CM | POA: Diagnosis not present

## 2019-01-22 DIAGNOSIS — M6281 Muscle weakness (generalized): Secondary | ICD-10-CM | POA: Diagnosis not present

## 2019-01-26 DIAGNOSIS — M545 Low back pain: Secondary | ICD-10-CM | POA: Diagnosis not present

## 2019-01-26 DIAGNOSIS — F411 Generalized anxiety disorder: Secondary | ICD-10-CM | POA: Diagnosis not present

## 2019-01-26 DIAGNOSIS — M4696 Unspecified inflammatory spondylopathy, lumbar region: Secondary | ICD-10-CM | POA: Diagnosis not present

## 2019-01-28 DIAGNOSIS — M79605 Pain in left leg: Secondary | ICD-10-CM | POA: Diagnosis not present

## 2019-01-28 DIAGNOSIS — M79604 Pain in right leg: Secondary | ICD-10-CM | POA: Diagnosis not present

## 2019-01-28 DIAGNOSIS — M545 Low back pain: Secondary | ICD-10-CM | POA: Diagnosis not present

## 2019-01-28 DIAGNOSIS — M6281 Muscle weakness (generalized): Secondary | ICD-10-CM | POA: Diagnosis not present

## 2019-02-17 DIAGNOSIS — I10 Essential (primary) hypertension: Secondary | ICD-10-CM | POA: Diagnosis not present

## 2019-02-17 DIAGNOSIS — M5416 Radiculopathy, lumbar region: Secondary | ICD-10-CM | POA: Diagnosis not present

## 2019-02-17 DIAGNOSIS — M546 Pain in thoracic spine: Secondary | ICD-10-CM | POA: Diagnosis not present

## 2019-02-17 DIAGNOSIS — M47896 Other spondylosis, lumbar region: Secondary | ICD-10-CM | POA: Diagnosis not present

## 2019-03-06 ENCOUNTER — Ambulatory Visit: Payer: BC Managed Care – PPO | Attending: Internal Medicine

## 2019-03-06 ENCOUNTER — Other Ambulatory Visit: Payer: Self-pay

## 2019-03-06 DIAGNOSIS — Z20822 Contact with and (suspected) exposure to covid-19: Secondary | ICD-10-CM

## 2019-03-07 LAB — NOVEL CORONAVIRUS, NAA: SARS-CoV-2, NAA: NOT DETECTED

## 2019-03-23 DIAGNOSIS — G894 Chronic pain syndrome: Secondary | ICD-10-CM | POA: Diagnosis not present

## 2019-03-23 DIAGNOSIS — Z79891 Long term (current) use of opiate analgesic: Secondary | ICD-10-CM | POA: Diagnosis not present

## 2019-03-23 DIAGNOSIS — M25551 Pain in right hip: Secondary | ICD-10-CM | POA: Diagnosis not present

## 2019-03-23 DIAGNOSIS — Z79899 Other long term (current) drug therapy: Secondary | ICD-10-CM | POA: Diagnosis not present

## 2019-03-23 DIAGNOSIS — M545 Low back pain: Secondary | ICD-10-CM | POA: Diagnosis not present

## 2019-03-23 DIAGNOSIS — L409 Psoriasis, unspecified: Secondary | ICD-10-CM | POA: Diagnosis not present

## 2019-04-02 DIAGNOSIS — E109 Type 1 diabetes mellitus without complications: Secondary | ICD-10-CM | POA: Diagnosis not present

## 2019-04-10 DIAGNOSIS — L409 Psoriasis, unspecified: Secondary | ICD-10-CM | POA: Diagnosis not present

## 2019-04-10 DIAGNOSIS — M25562 Pain in left knee: Secondary | ICD-10-CM | POA: Diagnosis not present

## 2019-04-10 DIAGNOSIS — M25551 Pain in right hip: Secondary | ICD-10-CM | POA: Diagnosis not present

## 2019-04-10 DIAGNOSIS — M25559 Pain in unspecified hip: Secondary | ICD-10-CM | POA: Diagnosis not present

## 2019-04-10 DIAGNOSIS — M25561 Pain in right knee: Secondary | ICD-10-CM | POA: Diagnosis not present

## 2019-04-10 DIAGNOSIS — M549 Dorsalgia, unspecified: Secondary | ICD-10-CM | POA: Diagnosis not present

## 2019-04-10 DIAGNOSIS — L4050 Arthropathic psoriasis, unspecified: Secondary | ICD-10-CM | POA: Diagnosis not present

## 2019-04-13 DIAGNOSIS — E109 Type 1 diabetes mellitus without complications: Secondary | ICD-10-CM | POA: Diagnosis not present

## 2019-04-13 DIAGNOSIS — I10 Essential (primary) hypertension: Secondary | ICD-10-CM | POA: Diagnosis not present

## 2019-04-13 DIAGNOSIS — E1121 Type 2 diabetes mellitus with diabetic nephropathy: Secondary | ICD-10-CM | POA: Diagnosis not present

## 2019-04-13 DIAGNOSIS — E0590 Thyrotoxicosis, unspecified without thyrotoxic crisis or storm: Secondary | ICD-10-CM | POA: Diagnosis not present

## 2019-04-15 ENCOUNTER — Other Ambulatory Visit: Payer: Self-pay | Admitting: Pain Medicine

## 2019-04-15 ENCOUNTER — Ambulatory Visit
Admission: RE | Admit: 2019-04-15 | Discharge: 2019-04-15 | Disposition: A | Discharge status: Home / Self Care | Payer: BC Managed Care – PPO | Source: Ambulatory Visit | Attending: Pain Medicine | Admitting: Pain Medicine

## 2019-04-15 DIAGNOSIS — M25551 Pain in right hip: Secondary | ICD-10-CM

## 2019-04-15 DIAGNOSIS — M25552 Pain in left hip: Secondary | ICD-10-CM | POA: Diagnosis not present

## 2019-04-27 DIAGNOSIS — M545 Low back pain: Secondary | ICD-10-CM | POA: Diagnosis not present

## 2019-04-27 DIAGNOSIS — L409 Psoriasis, unspecified: Secondary | ICD-10-CM | POA: Diagnosis not present

## 2019-04-27 DIAGNOSIS — G894 Chronic pain syndrome: Secondary | ICD-10-CM | POA: Diagnosis not present

## 2019-04-27 DIAGNOSIS — M25551 Pain in right hip: Secondary | ICD-10-CM | POA: Diagnosis not present

## 2019-05-22 DIAGNOSIS — F411 Generalized anxiety disorder: Secondary | ICD-10-CM | POA: Diagnosis not present

## 2019-05-22 DIAGNOSIS — G894 Chronic pain syndrome: Secondary | ICD-10-CM | POA: Diagnosis not present

## 2019-05-22 DIAGNOSIS — L400 Psoriasis vulgaris: Secondary | ICD-10-CM | POA: Diagnosis not present

## 2019-05-27 DIAGNOSIS — E109 Type 1 diabetes mellitus without complications: Secondary | ICD-10-CM | POA: Diagnosis not present

## 2019-05-27 DIAGNOSIS — Z794 Long term (current) use of insulin: Secondary | ICD-10-CM | POA: Diagnosis not present

## 2019-06-01 DIAGNOSIS — G894 Chronic pain syndrome: Secondary | ICD-10-CM | POA: Diagnosis not present

## 2019-06-01 DIAGNOSIS — Z79891 Long term (current) use of opiate analgesic: Secondary | ICD-10-CM | POA: Diagnosis not present

## 2019-06-01 DIAGNOSIS — M170 Bilateral primary osteoarthritis of knee: Secondary | ICD-10-CM | POA: Diagnosis not present

## 2019-06-01 DIAGNOSIS — Z79899 Other long term (current) drug therapy: Secondary | ICD-10-CM | POA: Diagnosis not present

## 2019-07-08 DIAGNOSIS — M5136 Other intervertebral disc degeneration, lumbar region: Secondary | ICD-10-CM | POA: Diagnosis not present

## 2019-07-08 DIAGNOSIS — M25551 Pain in right hip: Secondary | ICD-10-CM | POA: Diagnosis not present

## 2019-07-08 DIAGNOSIS — G894 Chronic pain syndrome: Secondary | ICD-10-CM | POA: Diagnosis not present

## 2019-07-08 DIAGNOSIS — L409 Psoriasis, unspecified: Secondary | ICD-10-CM | POA: Diagnosis not present

## 2019-07-14 DIAGNOSIS — D485 Neoplasm of uncertain behavior of skin: Secondary | ICD-10-CM | POA: Diagnosis not present

## 2019-07-14 DIAGNOSIS — R21 Rash and other nonspecific skin eruption: Secondary | ICD-10-CM | POA: Diagnosis not present

## 2019-07-14 DIAGNOSIS — L409 Psoriasis, unspecified: Secondary | ICD-10-CM | POA: Diagnosis not present

## 2019-07-23 DIAGNOSIS — E109 Type 1 diabetes mellitus without complications: Secondary | ICD-10-CM | POA: Diagnosis not present

## 2019-08-24 DIAGNOSIS — M170 Bilateral primary osteoarthritis of knee: Secondary | ICD-10-CM | POA: Diagnosis not present

## 2019-08-24 DIAGNOSIS — M25551 Pain in right hip: Secondary | ICD-10-CM | POA: Diagnosis not present

## 2019-08-24 DIAGNOSIS — Z79899 Other long term (current) drug therapy: Secondary | ICD-10-CM | POA: Diagnosis not present

## 2019-08-24 DIAGNOSIS — M5136 Other intervertebral disc degeneration, lumbar region: Secondary | ICD-10-CM | POA: Diagnosis not present

## 2019-08-24 DIAGNOSIS — Z79891 Long term (current) use of opiate analgesic: Secondary | ICD-10-CM | POA: Diagnosis not present

## 2019-08-24 DIAGNOSIS — G894 Chronic pain syndrome: Secondary | ICD-10-CM | POA: Diagnosis not present

## 2019-09-11 DIAGNOSIS — F4323 Adjustment disorder with mixed anxiety and depressed mood: Secondary | ICD-10-CM | POA: Diagnosis not present

## 2019-09-17 DIAGNOSIS — F4323 Adjustment disorder with mixed anxiety and depressed mood: Secondary | ICD-10-CM | POA: Diagnosis not present

## 2019-09-25 DIAGNOSIS — F4323 Adjustment disorder with mixed anxiety and depressed mood: Secondary | ICD-10-CM | POA: Diagnosis not present

## 2019-10-07 DIAGNOSIS — E109 Type 1 diabetes mellitus without complications: Secondary | ICD-10-CM | POA: Diagnosis not present

## 2019-10-20 DIAGNOSIS — M5136 Other intervertebral disc degeneration, lumbar region: Secondary | ICD-10-CM | POA: Diagnosis not present

## 2019-10-20 DIAGNOSIS — G894 Chronic pain syndrome: Secondary | ICD-10-CM | POA: Diagnosis not present

## 2019-10-20 DIAGNOSIS — M25551 Pain in right hip: Secondary | ICD-10-CM | POA: Diagnosis not present

## 2019-10-20 DIAGNOSIS — M170 Bilateral primary osteoarthritis of knee: Secondary | ICD-10-CM | POA: Diagnosis not present

## 2019-11-20 DIAGNOSIS — F4323 Adjustment disorder with mixed anxiety and depressed mood: Secondary | ICD-10-CM | POA: Diagnosis not present

## 2019-12-02 DIAGNOSIS — F4323 Adjustment disorder with mixed anxiety and depressed mood: Secondary | ICD-10-CM | POA: Diagnosis not present

## 2019-12-10 DIAGNOSIS — F4323 Adjustment disorder with mixed anxiety and depressed mood: Secondary | ICD-10-CM | POA: Diagnosis not present

## 2019-12-17 ENCOUNTER — Encounter (HOSPITAL_COMMUNITY): Payer: Self-pay | Admitting: Emergency Medicine

## 2019-12-17 ENCOUNTER — Emergency Department (HOSPITAL_COMMUNITY): Payer: BC Managed Care – PPO

## 2019-12-17 ENCOUNTER — Other Ambulatory Visit: Payer: Self-pay

## 2019-12-17 ENCOUNTER — Emergency Department (HOSPITAL_COMMUNITY)
Admission: EM | Admit: 2019-12-17 | Discharge: 2019-12-17 | Disposition: A | Discharge status: Home / Self Care | Payer: BC Managed Care – PPO | Attending: Emergency Medicine | Admitting: Emergency Medicine

## 2019-12-17 DIAGNOSIS — I10 Essential (primary) hypertension: Secondary | ICD-10-CM | POA: Diagnosis not present

## 2019-12-17 DIAGNOSIS — Z794 Long term (current) use of insulin: Secondary | ICD-10-CM | POA: Insufficient documentation

## 2019-12-17 DIAGNOSIS — R202 Paresthesia of skin: Secondary | ICD-10-CM | POA: Insufficient documentation

## 2019-12-17 DIAGNOSIS — F17220 Nicotine dependence, chewing tobacco, uncomplicated: Secondary | ICD-10-CM | POA: Diagnosis not present

## 2019-12-17 DIAGNOSIS — E109 Type 1 diabetes mellitus without complications: Secondary | ICD-10-CM | POA: Insufficient documentation

## 2019-12-17 DIAGNOSIS — R059 Cough, unspecified: Secondary | ICD-10-CM

## 2019-12-17 DIAGNOSIS — Z20822 Contact with and (suspected) exposure to covid-19: Secondary | ICD-10-CM | POA: Insufficient documentation

## 2019-12-17 LAB — RESPIRATORY PANEL BY RT PCR (FLU A&B, COVID)
Influenza A by PCR: NEGATIVE
Influenza B by PCR: NEGATIVE
SARS Coronavirus 2 by RT PCR: NEGATIVE

## 2019-12-17 MED ORDER — BENZONATATE 100 MG PO CAPS: 100.0000 mg | ORAL_CAPSULE | Freq: Three times a day (TID) | ORAL | 0 refills | Status: DC

## 2019-12-17 MED ORDER — ALBUTEROL SULFATE HFA 108 (90 BASE) MCG/ACT IN AERS: 2.0000 | INHALATION_SPRAY | Freq: Four times a day (QID) | RESPIRATORY_TRACT | 0 refills | Status: DC | PRN

## 2019-12-17 NOTE — Discharge Instructions (Signed)
Your chest x-ray today was reassuring.  We have given you medications to see if this will help with your cough.  As we discussed, if you have no improvement, you will need to follow-up with your primary care doctor in the next several days to evaluate for other causes of your cough.  Your Covid test today was negative.  Return emergency department for any difficulty breathing, chest pain or any other worsening concerning symptoms.

## 2019-12-17 NOTE — ED Provider Notes (Signed)
[facility] EMERGENCY DEPARTMENT Provider Note   CSN: [id number] Arrival date & time: 12/17/19  1802     History Chief Complaint  Patient presents with  . Cough    Eric [name] is a 34 y.o. male past medical history of anxiety, diabetes, hypertension who presents for evaluation of persistent cough that has been ongoing for the last month.  He states that sometimes cough is productive of phlegm but most the time it is a dry cough.  He states that he feels like he will get a tingling sensation in his chest which will have caused him to cough.  He states that sometimes when he coughs, he feels like he cannot catch his breath.  He states he has tried cough syrup with no improvement in symptoms.  He has not sought evaluation for this.  He felt like it was getting worse which is what prompted his ED visit today.  He denies any fevers, unexpected weight change.  He reports that he does snuff but does not smoke or vape.  He does not have any history of asthma or COPD.  The history is provided by the patient.       Past Medical History:  Diagnosis Date  . Anxiety   . Diabetes mellitus   . Hypertension     Patient Active Problem List   Diagnosis Date Noted  . Dehydration 03/31/2017  . Fever, unspecified 03/31/2017  . Viral gastroenteritis 03/30/2017  . Sinus tachycardia 03/30/2017  . DM type 1 (diabetes mellitus, type 1) (HCC) 02/01/2011  . Low back pain 02/01/2011  . Depression 02/01/2011  . Elevated blood pressure 02/01/2011    Past Surgical History:  Procedure Laterality Date  . FRACTURE SURGERY     reconstructive left knee surgery  . KNEE SURGERY         Family History  Problem Relation Age of Onset  . Hypertension Mother   . Hypertension Father     Social History   Tobacco Use  . Smoking status: Never Smoker  . Smokeless tobacco: Current User    Types: Snuff  Vaping Use  . Vaping Use: Never used  Substance Use Topics  . Alcohol use: Yes    Comment: occ   . Drug use: No    Home Medications Prior to Admission medications   Medication Sig Start Date End Date Taking? Authorizing Provider  albuterol (VENTOLIN HFA) 108 (90 Base) MCG/ACT inhaler Inhale 2 puffs into the lungs every 6 (six) hours as needed for wheezing or shortness of breath. 12/17/19   Maxwell Caul, PA-C  benzonatate (TESSALON) 100 MG capsule Take 1 capsule (100 mg total) by mouth every 8 (eight) hours. 12/17/19   Maxwell Caul, PA-C  diazepam (VALIUM) 5 MG tablet Take 5 mg by mouth 2 (two) times daily as needed for muscle spasms.  11/19/18   [provider]  gabapentin (NEURONTIN) 300 MG capsule Take 300 mg by mouth 3 (three) times daily.  09/26/18   [provider]  HYDROcodone-acetaminophen (NORCO/VICODIN) 5-325 MG tablet Take 1 tablet by mouth every 4 (four) hours as needed for moderate pain.  11/28/18   [provider]  ibuprofen (ADVIL) 600 MG tablet Take 1 tablet (600 mg total) by mouth every 6 (six) hours as needed. 11/29/18   Idol, Raynelle Fanning, PA-C  insulin lispro (HUMALOG) 100 UNIT/ML injection Inject 1-10 Units into the skin 3 (three) times daily before meals. Patient taking differently: 1-10 Units 3 (three) times daily with meals.  Based on sliding scale/carb intake-via pump 02/03/11   Elsaid, Hind I, MD  methocarbamol (ROBAXIN-750) 750 MG tablet Take 1 tablet (750 mg total) by mouth every 8 (eight) hours as needed for muscle spasms. 11/29/18   Burgess Amor, PA-C    Allergies    Patient has no known allergies.  Review of Systems   Review of Systems  Constitutional: Negative for fever and unexpected weight change.  Respiratory: Positive for cough.   All other systems reviewed and are negative.   Physical Exam Updated Vital Signs BP (!) 141/96 (BP Location: Left Arm)   Pulse 94   Temp 98.5 F (36.9 C) (Oral)   Resp 16   SpO2 96%   Physical Exam Vitals and nursing note reviewed.  Constitutional:      Appearance: He is well-developed.   HENT:     Head: Normocephalic and atraumatic.  Eyes:     General: No scleral icterus.       Right eye: No discharge.        Left eye: No discharge.     Conjunctiva/sclera: Conjunctivae normal.  Pulmonary:     Effort: Pulmonary effort is normal.     Breath sounds: Normal breath sounds.     Comments: Lungs clear to auscultation bilaterally.  Symmetric chest rise.  No wheezing, rales, rhonchi. No evidence of respiratory distress.  Skin:    General: Skin is warm and dry.  Neurological:     Mental Status: He is alert.  Psychiatric:        Speech: Speech normal.        Behavior: Behavior normal.     ED Results / Procedures / Treatments   Labs (all labs ordered are listed, but only abnormal results are displayed) Labs Reviewed  RESPIRATORY PANEL BY RT PCR (FLU A&B, COVID)    EKG None  Radiology DG Chest Portable 1 View  Result Date: 12/17/2019 CLINICAL DATA:  Cough EXAM: PORTABLE CHEST 1 VIEW COMPARISON:  07/06/2018 FINDINGS: The heart size and mediastinal contours are within normal limits. Both lungs are clear. The visualized skeletal structures are unremarkable. IMPRESSION: Normal study. Electronically Signed   By: Charlett Nose M.D.   On: 12/17/2019 19:06    Procedures Procedures (including critical care time)  Medications Ordered in ED Medications - No data to display  ED Course  I have reviewed the triage vital signs and the nursing notes.  Pertinent labs & imaging results that were available during my care of the patient were reviewed by me and considered in my medical decision making (see chart for details).    MDM Rules/Calculators/A&P                           34 year old male who presents for evaluation of cough x1 month.  States that he feels like it is getting more frequent which is what prompted ED visit.  No fevers.  On initial arrival, he is afebrile nontoxic-appearing.  He is slightly tachycardic but vitals otherwise stable.  On exam, no evidence of  respiratory distress.  Chest x-ray and Covid test ordered at triage.  Patient with no obvious wheezing on exam.  No evidence of rales that would be concerning for infectious etiology.  I did discuss with him possibly this could be GERD related but states he does not really have any heartburn after eating.  He has not had any recent fever or weight loss.  Additionally, history/physical exam not concerning for  PE as this is been ongoing for a month.  Chest x-ray reviewed.  Negative for any acute abnormality.  His Covid test is negative.  Discussed results with patient.  At this time, patient is hemodynamically stable in the ED without any signs of respiratory distress.  He has no hypoxia.  We will plan to treat his symptoms with Tessalon and albuterol inhaler for bronchospasm.  Patient instructed to follow-up with his primary care doctor if this does not improve his symptoms. At this time, patient exhibits no emergent life-threatening condition that require further evaluation in ED. Patient had ample opportunity for questions and discussion. All patient's questions were answered with full understanding. Strict return precautions discussed. Patient expresses understanding and agreement to plan.   Portions of this note were generated with Scientist, clinical (histocompatibility and immunogenetics). Dictation errors may occur despite best attempts at proofreading.   Final Clinical Impression(s) / ED Diagnoses Final diagnoses:  Cough    Rx / DC Orders ED Discharge Orders         Ordered    albuterol (VENTOLIN HFA) 108 (90 Base) MCG/ACT inhaler  Every 6 hours PRN        12/17/19 2112    benzonatate (TESSALON) 100 MG capsule  Every 8 hours        12/17/19 2112           Maxwell Caul, PA-C 12/17/19 2333    Bethann Berkshire, MD 12/18/19 2235

## 2019-12-17 NOTE — ED Triage Notes (Signed)
Pt reports sinus congestion and cough. Pt reports the cough has gotten worse the last 2 days. Denies fevers.

## 2019-12-25 DIAGNOSIS — E109 Type 1 diabetes mellitus without complications: Secondary | ICD-10-CM | POA: Diagnosis not present

## 2019-12-28 DIAGNOSIS — F4323 Adjustment disorder with mixed anxiety and depressed mood: Secondary | ICD-10-CM | POA: Diagnosis not present

## 2020-01-02 DIAGNOSIS — J069 Acute upper respiratory infection, unspecified: Secondary | ICD-10-CM | POA: Diagnosis not present

## 2020-01-15 DIAGNOSIS — R0981 Nasal congestion: Secondary | ICD-10-CM | POA: Diagnosis not present

## 2020-01-15 DIAGNOSIS — R5383 Other fatigue: Secondary | ICD-10-CM | POA: Diagnosis not present

## 2020-01-15 DIAGNOSIS — J029 Acute pharyngitis, unspecified: Secondary | ICD-10-CM | POA: Diagnosis not present

## 2020-01-15 DIAGNOSIS — R519 Headache, unspecified: Secondary | ICD-10-CM | POA: Diagnosis not present

## 2020-01-29 DIAGNOSIS — F4323 Adjustment disorder with mixed anxiety and depressed mood: Secondary | ICD-10-CM | POA: Diagnosis not present

## 2020-02-18 DIAGNOSIS — R0602 Shortness of breath: Secondary | ICD-10-CM | POA: Diagnosis not present

## 2020-02-18 DIAGNOSIS — E1065 Type 1 diabetes mellitus with hyperglycemia: Secondary | ICD-10-CM | POA: Diagnosis not present

## 2020-02-18 DIAGNOSIS — R110 Nausea: Secondary | ICD-10-CM | POA: Diagnosis not present

## 2020-02-18 DIAGNOSIS — R42 Dizziness and giddiness: Secondary | ICD-10-CM | POA: Diagnosis not present

## 2020-02-18 DIAGNOSIS — M4696 Unspecified inflammatory spondylopathy, lumbar region: Secondary | ICD-10-CM | POA: Diagnosis not present

## 2020-02-18 DIAGNOSIS — M791 Myalgia, unspecified site: Secondary | ICD-10-CM | POA: Diagnosis not present

## 2020-02-18 DIAGNOSIS — Z0001 Encounter for general adult medical examination with abnormal findings: Secondary | ICD-10-CM | POA: Diagnosis not present

## 2020-02-18 DIAGNOSIS — S81801A Unspecified open wound, right lower leg, initial encounter: Secondary | ICD-10-CM | POA: Diagnosis not present

## 2020-02-22 DIAGNOSIS — F4323 Adjustment disorder with mixed anxiety and depressed mood: Secondary | ICD-10-CM | POA: Diagnosis not present

## 2020-02-23 DIAGNOSIS — M791 Myalgia, unspecified site: Secondary | ICD-10-CM | POA: Diagnosis not present

## 2020-02-23 DIAGNOSIS — R42 Dizziness and giddiness: Secondary | ICD-10-CM | POA: Diagnosis not present

## 2020-02-23 DIAGNOSIS — R059 Cough, unspecified: Secondary | ICD-10-CM | POA: Diagnosis not present

## 2020-02-23 DIAGNOSIS — U071 COVID-19: Secondary | ICD-10-CM | POA: Diagnosis not present

## 2020-02-29 ENCOUNTER — Other Ambulatory Visit: Payer: Self-pay

## 2020-02-29 ENCOUNTER — Ambulatory Visit (HOSPITAL_COMMUNITY)
Admission: RE | Admit: 2020-02-29 | Discharge: 2020-02-29 | Disposition: A | Discharge status: Home / Self Care | Payer: BC Managed Care – PPO | Source: Ambulatory Visit | Attending: Internal Medicine | Admitting: Internal Medicine

## 2020-02-29 ENCOUNTER — Other Ambulatory Visit (HOSPITAL_COMMUNITY): Payer: Self-pay | Admitting: Internal Medicine

## 2020-02-29 DIAGNOSIS — R059 Cough, unspecified: Secondary | ICD-10-CM

## 2020-02-29 DIAGNOSIS — M791 Myalgia, unspecified site: Secondary | ICD-10-CM | POA: Diagnosis not present

## 2020-02-29 DIAGNOSIS — U071 COVID-19: Secondary | ICD-10-CM | POA: Diagnosis not present

## 2020-02-29 DIAGNOSIS — R42 Dizziness and giddiness: Secondary | ICD-10-CM | POA: Diagnosis not present

## 2020-03-02 DIAGNOSIS — R059 Cough, unspecified: Secondary | ICD-10-CM | POA: Diagnosis not present

## 2020-03-02 DIAGNOSIS — R079 Chest pain, unspecified: Secondary | ICD-10-CM | POA: Diagnosis not present

## 2020-03-02 DIAGNOSIS — U099 Post covid-19 condition, unspecified: Secondary | ICD-10-CM | POA: Diagnosis not present

## 2020-03-02 DIAGNOSIS — R5383 Other fatigue: Secondary | ICD-10-CM | POA: Diagnosis not present

## 2020-03-03 DIAGNOSIS — U071 COVID-19: Secondary | ICD-10-CM | POA: Diagnosis not present

## 2020-03-03 DIAGNOSIS — R42 Dizziness and giddiness: Secondary | ICD-10-CM | POA: Diagnosis not present

## 2020-03-07 DIAGNOSIS — M4696 Unspecified inflammatory spondylopathy, lumbar region: Secondary | ICD-10-CM | POA: Diagnosis not present

## 2020-03-07 DIAGNOSIS — S81801A Unspecified open wound, right lower leg, initial encounter: Secondary | ICD-10-CM | POA: Diagnosis not present

## 2020-03-07 DIAGNOSIS — U071 COVID-19: Secondary | ICD-10-CM | POA: Diagnosis not present

## 2020-03-07 DIAGNOSIS — R110 Nausea: Secondary | ICD-10-CM | POA: Diagnosis not present

## 2020-03-07 DIAGNOSIS — R5383 Other fatigue: Secondary | ICD-10-CM | POA: Diagnosis not present

## 2020-03-15 DIAGNOSIS — R112 Nausea with vomiting, unspecified: Secondary | ICD-10-CM | POA: Diagnosis not present

## 2020-03-15 DIAGNOSIS — R197 Diarrhea, unspecified: Secondary | ICD-10-CM | POA: Diagnosis not present

## 2020-03-19 DIAGNOSIS — F4323 Adjustment disorder with mixed anxiety and depressed mood: Secondary | ICD-10-CM | POA: Diagnosis not present

## 2020-04-08 DIAGNOSIS — F4323 Adjustment disorder with mixed anxiety and depressed mood: Secondary | ICD-10-CM | POA: Diagnosis not present

## 2020-05-13 HISTORY — PX: AMPUTATION: SHX166

## 2020-05-16 DIAGNOSIS — L400 Psoriasis vulgaris: Secondary | ICD-10-CM | POA: Diagnosis not present

## 2020-05-16 DIAGNOSIS — E1065 Type 1 diabetes mellitus with hyperglycemia: Secondary | ICD-10-CM | POA: Diagnosis not present

## 2020-05-26 DIAGNOSIS — F4323 Adjustment disorder with mixed anxiety and depressed mood: Secondary | ICD-10-CM | POA: Diagnosis not present

## 2020-05-27 DIAGNOSIS — E109 Type 1 diabetes mellitus without complications: Secondary | ICD-10-CM | POA: Diagnosis not present

## 2020-05-30 ENCOUNTER — Other Ambulatory Visit (HOSPITAL_COMMUNITY): Payer: Self-pay | Admitting: Internal Medicine

## 2020-05-30 DIAGNOSIS — M25561 Pain in right knee: Secondary | ICD-10-CM | POA: Diagnosis not present

## 2020-05-30 DIAGNOSIS — M25562 Pain in left knee: Secondary | ICD-10-CM

## 2020-05-30 DIAGNOSIS — L409 Psoriasis, unspecified: Secondary | ICD-10-CM | POA: Diagnosis not present

## 2020-06-11 DIAGNOSIS — S68119A Complete traumatic metacarpophalangeal amputation of unspecified finger, initial encounter: Secondary | ICD-10-CM | POA: Insufficient documentation

## 2020-06-11 DIAGNOSIS — S61401A Unspecified open wound of right hand, initial encounter: Secondary | ICD-10-CM | POA: Insufficient documentation

## 2020-06-11 DIAGNOSIS — S6721XA Crushing injury of right hand, initial encounter: Secondary | ICD-10-CM | POA: Insufficient documentation

## 2020-06-12 DIAGNOSIS — S6991XA Unspecified injury of right wrist, hand and finger(s), initial encounter: Secondary | ICD-10-CM | POA: Diagnosis not present

## 2020-06-12 DIAGNOSIS — Z9889 Other specified postprocedural states: Secondary | ICD-10-CM | POA: Diagnosis not present

## 2020-06-12 DIAGNOSIS — E10649 Type 1 diabetes mellitus with hypoglycemia without coma: Secondary | ICD-10-CM | POA: Diagnosis not present

## 2020-06-12 DIAGNOSIS — S67190A Crushing injury of right index finger, initial encounter: Secondary | ICD-10-CM | POA: Diagnosis not present

## 2020-06-12 DIAGNOSIS — S61101A Unspecified open wound of right thumb with damage to nail, initial encounter: Secondary | ICD-10-CM | POA: Diagnosis not present

## 2020-06-12 DIAGNOSIS — M79641 Pain in right hand: Secondary | ICD-10-CM | POA: Diagnosis not present

## 2020-06-12 DIAGNOSIS — Z89021 Acquired absence of right finger(s): Secondary | ICD-10-CM | POA: Diagnosis not present

## 2020-06-12 DIAGNOSIS — G8918 Other acute postprocedural pain: Secondary | ICD-10-CM | POA: Diagnosis not present

## 2020-06-12 DIAGNOSIS — S68610A Complete traumatic transphalangeal amputation of right index finger, initial encounter: Secondary | ICD-10-CM | POA: Diagnosis not present

## 2020-06-12 DIAGNOSIS — E109 Type 1 diabetes mellitus without complications: Secondary | ICD-10-CM | POA: Diagnosis not present

## 2020-06-12 DIAGNOSIS — S62636B Displaced fracture of distal phalanx of right little finger, initial encounter for open fracture: Secondary | ICD-10-CM | POA: Diagnosis not present

## 2020-06-12 DIAGNOSIS — S6701XA Crushing injury of right thumb, initial encounter: Secondary | ICD-10-CM | POA: Diagnosis not present

## 2020-06-12 DIAGNOSIS — G8911 Acute pain due to trauma: Secondary | ICD-10-CM | POA: Diagnosis not present

## 2020-06-12 DIAGNOSIS — S67194A Crushing injury of right ring finger, initial encounter: Secondary | ICD-10-CM | POA: Diagnosis not present

## 2020-06-12 DIAGNOSIS — W319XXA Contact with unspecified machinery, initial encounter: Secondary | ICD-10-CM | POA: Diagnosis not present

## 2020-06-12 DIAGNOSIS — S68614A Complete traumatic transphalangeal amputation of right ring finger, initial encounter: Secondary | ICD-10-CM | POA: Diagnosis not present

## 2020-06-12 DIAGNOSIS — S68612A Complete traumatic transphalangeal amputation of right middle finger, initial encounter: Secondary | ICD-10-CM | POA: Diagnosis not present

## 2020-06-12 DIAGNOSIS — S67192A Crushing injury of right middle finger, initial encounter: Secondary | ICD-10-CM | POA: Diagnosis not present

## 2020-06-12 DIAGNOSIS — S6721XA Crushing injury of right hand, initial encounter: Secondary | ICD-10-CM | POA: Diagnosis not present

## 2020-06-12 DIAGNOSIS — S67196A Crushing injury of right little finger, initial encounter: Secondary | ICD-10-CM | POA: Diagnosis not present

## 2020-06-16 DIAGNOSIS — T23361A Burn of third degree of back of right hand, initial encounter: Secondary | ICD-10-CM | POA: Diagnosis not present

## 2020-06-16 DIAGNOSIS — L409 Psoriasis, unspecified: Secondary | ICD-10-CM | POA: Diagnosis not present

## 2020-06-16 DIAGNOSIS — T1490XA Injury, unspecified, initial encounter: Secondary | ICD-10-CM | POA: Diagnosis not present

## 2020-06-16 DIAGNOSIS — F322 Major depressive disorder, single episode, severe without psychotic features: Secondary | ICD-10-CM | POA: Diagnosis not present

## 2020-06-17 DIAGNOSIS — F4323 Adjustment disorder with mixed anxiety and depressed mood: Secondary | ICD-10-CM | POA: Diagnosis not present

## 2020-06-18 ENCOUNTER — Emergency Department (HOSPITAL_COMMUNITY)
Admission: EM | Admit: 2020-06-18 | Discharge: 2020-06-19 | Disposition: A | Discharge status: Home / Self Care | Payer: BC Managed Care – PPO | Attending: Emergency Medicine | Admitting: Emergency Medicine

## 2020-06-18 ENCOUNTER — Encounter (HOSPITAL_COMMUNITY): Payer: Self-pay

## 2020-06-18 ENCOUNTER — Other Ambulatory Visit: Payer: Self-pay

## 2020-06-18 DIAGNOSIS — S68620D Partial traumatic transphalangeal amputation of right index finger, subsequent encounter: Secondary | ICD-10-CM | POA: Diagnosis not present

## 2020-06-18 DIAGNOSIS — G8918 Other acute postprocedural pain: Secondary | ICD-10-CM | POA: Insufficient documentation

## 2020-06-18 DIAGNOSIS — E109 Type 1 diabetes mellitus without complications: Secondary | ICD-10-CM | POA: Diagnosis not present

## 2020-06-18 DIAGNOSIS — M79641 Pain in right hand: Secondary | ICD-10-CM | POA: Diagnosis not present

## 2020-06-18 DIAGNOSIS — S68622D Partial traumatic transphalangeal amputation of right middle finger, subsequent encounter: Secondary | ICD-10-CM | POA: Diagnosis not present

## 2020-06-18 DIAGNOSIS — Z89021 Acquired absence of right finger(s): Secondary | ICD-10-CM | POA: Diagnosis not present

## 2020-06-18 DIAGNOSIS — S68624D Partial traumatic transphalangeal amputation of right ring finger, subsequent encounter: Secondary | ICD-10-CM | POA: Diagnosis not present

## 2020-06-18 DIAGNOSIS — Z794 Long term (current) use of insulin: Secondary | ICD-10-CM | POA: Insufficient documentation

## 2020-06-18 DIAGNOSIS — S68119D Complete traumatic metacarpophalangeal amputation of unspecified finger, subsequent encounter: Secondary | ICD-10-CM

## 2020-06-18 DIAGNOSIS — I10 Essential (primary) hypertension: Secondary | ICD-10-CM | POA: Insufficient documentation

## 2020-06-18 HISTORY — DX: Depression, unspecified: F32.A

## 2020-06-18 NOTE — ED Triage Notes (Signed)
Pt arrives via POV c/o right hand pain after having a work accident X 1 week where Pts hands was stuck and pulled into a Mill and Pts 2nd, 3rd and 4th distal digits were removed from his hand. Pts hand is wrapped upon arrival and Pt reports changing his bandage today. Pt received Tetnus shot and is currently taking antibiotics following injury. Pt reporting needing help with pain control at this time.

## 2020-06-19 MED ORDER — HYDROMORPHONE HCL 2 MG/ML IJ SOLN
2.0000 mg | Freq: Once | INTRAMUSCULAR | Status: AC
  Administered 2020-06-19: 2 mg via INTRAMUSCULAR
  Filled 2020-06-19: qty 1

## 2020-06-19 MED ORDER — OXYCODONE-ACETAMINOPHEN 5-325 MG PO TABS: 1.0000 | ORAL_TABLET | Freq: Four times a day (QID) | ORAL | 0 refills | Status: DC | PRN

## 2020-06-19 NOTE — ED Provider Notes (Signed)
[facility] EMERGENCY DEPARTMENT Provider Note   CSN: [id number] Arrival date & time: 06/18/20  2326     History Chief Complaint  Patient presents with  . Finger Injury    Right hand pain    Eric [name] is a 35 y.o. male.  Patient is a 35 year old male with history of diabetes, hypertension, and anxiety.  Patient with recent crush injury to the right hand causing amputations of the second, third, and fourth fingers, as well as the nails of the thumb and fifth finger.  This occurred while he was at work at [employer].  He got his hand caught in a piece of machinery causing this injury.  He was flown to a hospital in [place] where he had surgical repair.  This was 1 week ago.  Patient presents here tonight complaining of increasing pain.  He was prescribed Vicodin, however this is helping very little.  He denies any purulent drainage.  He denies any fevers, chills, or streaks up or down the hand.  The history is provided by the patient.       Past Medical History:  Diagnosis Date  . Anxiety   . Depression   . Diabetes mellitus   . Hypertension     Patient Active Problem List   Diagnosis Date Noted  . Dehydration 03/31/2017  . Fever, unspecified 03/31/2017  . Viral gastroenteritis 03/30/2017  . Sinus tachycardia 03/30/2017  . DM type 1 (diabetes mellitus, type 1) (HCC) 02/01/2011  . Low back pain 02/01/2011  . Depression 02/01/2011  . Elevated blood pressure 02/01/2011    Past Surgical History:  Procedure Laterality Date  . AMPUTATION Right    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  . FRACTURE SURGERY     reconstructive left knee surgery  . KNEE SURGERY         Family History  Problem Relation Age of Onset  . Hypertension Mother   . Hypertension Father     Social History   Tobacco Use  . Smoking status: Never Smoker  . Smokeless tobacco: Current User    Types: Snuff  Vaping Use  . Vaping Use: Never used  Substance Use Topics  . Alcohol use: Yes     Comment: occ  . Drug use: No    Home Medications Prior to Admission medications   Medication Sig Start Date End Date Taking? Authorizing Provider  cephALEXin (KEFLEX) 500 MG capsule Take 500 mg by mouth 4 (four) times daily. 06/11/20  Yes [provider]  diazepam (VALIUM) 10 MG tablet Take 10 mg by mouth 2 (two) times daily as needed. 06/16/20  Yes [provider]  escitalopram (LEXAPRO) 20 MG tablet Take 1 tablet by mouth daily. 04/05/20  Yes [provider]  gabapentin (NEURONTIN) 300 MG capsule Take 300 mg by mouth 3 (three) times daily.  09/26/18  Yes [provider]  oxyCODONE-acetaminophen (PERCOCET/ROXICET) 5-325 MG tablet Take 1 tablet by mouth every 6 (six) hours as needed. 06/13/20  Yes [provider]  albuterol (VENTOLIN HFA) 108 (90 Base) MCG/ACT inhaler Inhale 2 puffs into the lungs every 6 (six) hours as needed for wheezing or shortness of breath. 12/17/19   Maxwell Caul, PA-C  benzonatate (TESSALON) 100 MG capsule Take 1 capsule (100 mg total) by mouth every 8 (eight) hours. 12/17/19   Maxwell Caul, PA-C  diazepam (VALIUM) 5 MG tablet Take 5 mg by mouth 2 (two) times daily as needed for muscle spasms.  11/19/18  [provider]  HYDROcodone-acetaminophen (NORCO/VICODIN) 5-325 MG tablet Take 1 tablet by mouth every 4 (four) hours as needed for moderate pain.  11/28/18   [provider]  ibuprofen (ADVIL) 600 MG tablet Take 1 tablet (600 mg total) by mouth every 6 (six) hours as needed. 11/29/18   Idol, Raynelle Fanning, PA-C  insulin lispro (HUMALOG) 100 UNIT/ML injection Inject 1-10 Units into the skin 3 (three) times daily before meals. Patient taking differently: 1-10 Units 3 (three) times daily with meals. Based on sliding scale/carb intake-via pump 02/03/11   Elsaid, Hind I, MD  methocarbamol (ROBAXIN-750) 750 MG tablet Take 1 tablet (750 mg total) by mouth every 8 (eight) hours as needed for muscle spasms. 11/29/18   Burgess Amor, PA-C    Allergies    Patient has no known allergies.  Review of Systems   Review of Systems  All other systems reviewed and are negative.   Physical Exam Updated Vital Signs Ht 5\' 9"  (1.753 m)   Wt 77.1 kg   BMI 25.10 kg/m   Physical Exam Vitals and nursing note reviewed.  Constitutional:      General: He is not in acute distress.    Appearance: Normal appearance. He is not ill-appearing, toxic-appearing or diaphoretic.  HENT:     Head: Normocephalic and atraumatic.  Pulmonary:     Effort: Pulmonary effort is normal.  Musculoskeletal:     Comments: The right hand is status post amputation of the second, third, and fourth fingers at the level of the PIP joint.  Stitches are in place.  There is no purulent drainage or significant warmth or erythema extending into the hand.  There are also avulsions of the nail bed of the thumb and fifth finger.  There are aluminum splints placed into the nailbed.  There is no significant redness or purulent drainage.  Please see photos below.  Skin:    General: Skin is warm and dry.  Neurological:     Mental Status: He is alert.           ED Results / Procedures / Treatments   Labs (all labs ordered are listed, but only abnormal results are displayed) Labs Reviewed - No data to display  EKG None  Radiology No results found.  Procedures Procedures   Medications Ordered in ED Medications  HYDROmorphone (DILAUDID) injection 2 mg (has no administration in time range)    ED Course  I have reviewed the triage vital signs and the nursing notes.  Pertinent labs & imaging results that were available during my care of the patient were reviewed by me and considered in my medical decision making (see chart for details).    MDM Rules/Calculators/A&P  Patient presenting with ongoing pain status post finger amputations.  Patient to be given IM Dilaudid here in the ER and discharged with Percocet.  He is to follow-up on Monday  with his hand surgeon.  He is apparently waiting for a referral to [facility] as [place] is much further from his home.  I see nothing that appears infectious.  His wounds will be redressed and patient discharged.  He is currently taking Keflex.  Final Clinical Impression(s) / ED Diagnoses Final diagnoses:  None    Rx / DC Orders ED Discharge Orders    None       Lorenso Quarry, MD 06/19/20 0004

## 2020-06-19 NOTE — Discharge Instructions (Addendum)
Continue wound care as previously recommended.  Continue taking Keflex as previously prescribed.  Stop taking hydrocodone.  Begin taking Percocet as prescribed as needed for pain.  Call your hand surgeon on Monday to arrange a follow-up appointment.

## 2020-06-21 ENCOUNTER — Encounter (HOSPITAL_COMMUNITY): Payer: Self-pay | Admitting: Emergency Medicine

## 2020-06-21 ENCOUNTER — Emergency Department (HOSPITAL_COMMUNITY)
Admission: EM | Admit: 2020-06-21 | Discharge: 2020-06-21 | Disposition: A | Discharge status: Home / Self Care | Payer: BC Managed Care – PPO | Attending: Emergency Medicine | Admitting: Emergency Medicine

## 2020-06-21 ENCOUNTER — Other Ambulatory Visit: Payer: Self-pay

## 2020-06-21 DIAGNOSIS — Z20822 Contact with and (suspected) exposure to covid-19: Secondary | ICD-10-CM | POA: Diagnosis not present

## 2020-06-21 DIAGNOSIS — Z5321 Procedure and treatment not carried out due to patient leaving prior to being seen by health care provider: Secondary | ICD-10-CM | POA: Insufficient documentation

## 2020-06-21 DIAGNOSIS — I10 Essential (primary) hypertension: Secondary | ICD-10-CM | POA: Diagnosis not present

## 2020-06-21 DIAGNOSIS — E109 Type 1 diabetes mellitus without complications: Secondary | ICD-10-CM | POA: Diagnosis not present

## 2020-06-21 DIAGNOSIS — T8789 Other complications of amputation stump: Secondary | ICD-10-CM | POA: Diagnosis not present

## 2020-06-21 DIAGNOSIS — S62523D Displaced fracture of distal phalanx of unspecified thumb, subsequent encounter for fracture with routine healing: Secondary | ICD-10-CM | POA: Diagnosis not present

## 2020-06-21 DIAGNOSIS — E10649 Type 1 diabetes mellitus with hypoglycemia without coma: Secondary | ICD-10-CM | POA: Diagnosis not present

## 2020-06-21 DIAGNOSIS — L409 Psoriasis, unspecified: Secondary | ICD-10-CM | POA: Diagnosis not present

## 2020-06-21 DIAGNOSIS — E1042 Type 1 diabetes mellitus with diabetic polyneuropathy: Secondary | ICD-10-CM | POA: Diagnosis not present

## 2020-06-21 DIAGNOSIS — L03113 Cellulitis of right upper limb: Secondary | ICD-10-CM | POA: Diagnosis not present

## 2020-06-21 DIAGNOSIS — R7982 Elevated C-reactive protein (CRP): Secondary | ICD-10-CM | POA: Diagnosis not present

## 2020-06-21 DIAGNOSIS — R230 Cyanosis: Secondary | ICD-10-CM | POA: Insufficient documentation

## 2020-06-21 DIAGNOSIS — Z9641 Presence of insulin pump (external) (internal): Secondary | ICD-10-CM | POA: Diagnosis not present

## 2020-06-21 DIAGNOSIS — S62636D Displaced fracture of distal phalanx of right little finger, subsequent encounter for fracture with routine healing: Secondary | ICD-10-CM | POA: Diagnosis not present

## 2020-06-21 DIAGNOSIS — E1065 Type 1 diabetes mellitus with hyperglycemia: Secondary | ICD-10-CM | POA: Diagnosis not present

## 2020-06-21 DIAGNOSIS — T8149XA Infection following a procedure, other surgical site, initial encounter: Secondary | ICD-10-CM | POA: Diagnosis not present

## 2020-06-21 DIAGNOSIS — E1021 Type 1 diabetes mellitus with diabetic nephropathy: Secondary | ICD-10-CM | POA: Diagnosis not present

## 2020-06-21 DIAGNOSIS — Z89029 Acquired absence of unspecified finger(s): Secondary | ICD-10-CM | POA: Diagnosis not present

## 2020-06-21 DIAGNOSIS — Y9289 Other specified places as the place of occurrence of the external cause: Secondary | ICD-10-CM | POA: Diagnosis not present

## 2020-06-21 DIAGNOSIS — Z79899 Other long term (current) drug therapy: Secondary | ICD-10-CM | POA: Diagnosis not present

## 2020-06-21 DIAGNOSIS — Y835 Amputation of limb(s) as the cause of abnormal reaction of the patient, or of later complication, without mention of misadventure at the time of the procedure: Secondary | ICD-10-CM | POA: Diagnosis not present

## 2020-06-21 DIAGNOSIS — F419 Anxiety disorder, unspecified: Secondary | ICD-10-CM | POA: Diagnosis not present

## 2020-06-21 DIAGNOSIS — M79641 Pain in right hand: Secondary | ICD-10-CM | POA: Diagnosis not present

## 2020-06-21 DIAGNOSIS — Z7189 Other specified counseling: Secondary | ICD-10-CM | POA: Diagnosis not present

## 2020-06-21 DIAGNOSIS — W231XXS Caught, crushed, jammed, or pinched between stationary objects, sequela: Secondary | ICD-10-CM | POA: Diagnosis not present

## 2020-06-21 DIAGNOSIS — F32A Depression, unspecified: Secondary | ICD-10-CM | POA: Diagnosis not present

## 2020-06-21 DIAGNOSIS — R739 Hyperglycemia, unspecified: Secondary | ICD-10-CM | POA: Diagnosis not present

## 2020-06-21 DIAGNOSIS — F4323 Adjustment disorder with mixed anxiety and depressed mood: Secondary | ICD-10-CM | POA: Diagnosis not present

## 2020-06-21 DIAGNOSIS — Z4681 Encounter for fitting and adjustment of insulin pump: Secondary | ICD-10-CM | POA: Diagnosis not present

## 2020-06-21 DIAGNOSIS — T148XXA Other injury of unspecified body region, initial encounter: Secondary | ICD-10-CM | POA: Diagnosis not present

## 2020-06-21 DIAGNOSIS — Z794 Long term (current) use of insulin: Secondary | ICD-10-CM | POA: Diagnosis not present

## 2020-06-21 DIAGNOSIS — W319XXA Contact with unspecified machinery, initial encounter: Secondary | ICD-10-CM | POA: Diagnosis not present

## 2020-06-21 DIAGNOSIS — F17290 Nicotine dependence, other tobacco product, uncomplicated: Secondary | ICD-10-CM | POA: Diagnosis not present

## 2020-06-21 DIAGNOSIS — I96 Gangrene, not elsewhere classified: Secondary | ICD-10-CM | POA: Diagnosis not present

## 2020-06-21 DIAGNOSIS — Y990 Civilian activity done for income or pay: Secondary | ICD-10-CM | POA: Diagnosis not present

## 2020-06-21 NOTE — ED Triage Notes (Signed)
Pt c/o increased pain to the right hand today. Pt states the tips of fingers are black which is new. Pt states his pain meds are not working.

## 2020-06-22 DIAGNOSIS — L409 Psoriasis, unspecified: Secondary | ICD-10-CM | POA: Insufficient documentation

## 2020-06-22 DIAGNOSIS — L089 Local infection of the skin and subcutaneous tissue, unspecified: Secondary | ICD-10-CM | POA: Insufficient documentation

## 2020-07-01 DIAGNOSIS — Z4681 Encounter for fitting and adjustment of insulin pump: Secondary | ICD-10-CM | POA: Diagnosis not present

## 2020-07-01 DIAGNOSIS — E0590 Thyrotoxicosis, unspecified without thyrotoxic crisis or storm: Secondary | ICD-10-CM | POA: Diagnosis not present

## 2020-07-01 DIAGNOSIS — E108 Type 1 diabetes mellitus with unspecified complications: Secondary | ICD-10-CM | POA: Diagnosis not present

## 2020-07-02 DIAGNOSIS — L409 Psoriasis, unspecified: Secondary | ICD-10-CM | POA: Diagnosis not present

## 2020-07-02 DIAGNOSIS — T1490XD Injury, unspecified, subsequent encounter: Secondary | ICD-10-CM | POA: Diagnosis not present

## 2020-07-02 DIAGNOSIS — T23361D Burn of third degree of back of right hand, subsequent encounter: Secondary | ICD-10-CM | POA: Diagnosis not present

## 2020-07-07 ENCOUNTER — Emergency Department (HOSPITAL_COMMUNITY)
Admission: EM | Admit: 2020-07-07 | Discharge: 2020-07-07 | Disposition: A | Discharge status: Home / Self Care | Payer: BC Managed Care – PPO | Source: Home / Self Care | Attending: Emergency Medicine | Admitting: Emergency Medicine

## 2020-07-07 ENCOUNTER — Encounter (HOSPITAL_COMMUNITY): Payer: Self-pay | Admitting: Emergency Medicine

## 2020-07-07 ENCOUNTER — Emergency Department (HOSPITAL_COMMUNITY)
Admission: EM | Admit: 2020-07-07 | Discharge: 2020-07-07 | Disposition: A | Discharge status: Home / Self Care | Payer: BC Managed Care – PPO | Attending: Emergency Medicine | Admitting: Emergency Medicine

## 2020-07-07 ENCOUNTER — Other Ambulatory Visit: Payer: Self-pay

## 2020-07-07 ENCOUNTER — Encounter (HOSPITAL_COMMUNITY): Payer: Self-pay

## 2020-07-07 DIAGNOSIS — M79641 Pain in right hand: Secondary | ICD-10-CM | POA: Insufficient documentation

## 2020-07-07 DIAGNOSIS — I10 Essential (primary) hypertension: Secondary | ICD-10-CM | POA: Diagnosis not present

## 2020-07-07 DIAGNOSIS — E109 Type 1 diabetes mellitus without complications: Secondary | ICD-10-CM | POA: Insufficient documentation

## 2020-07-07 DIAGNOSIS — E871 Hypo-osmolality and hyponatremia: Secondary | ICD-10-CM | POA: Insufficient documentation

## 2020-07-07 DIAGNOSIS — N289 Disorder of kidney and ureter, unspecified: Secondary | ICD-10-CM | POA: Insufficient documentation

## 2020-07-07 DIAGNOSIS — M79644 Pain in right finger(s): Secondary | ICD-10-CM | POA: Diagnosis not present

## 2020-07-07 DIAGNOSIS — S68119D Complete traumatic metacarpophalangeal amputation of unspecified finger, subsequent encounter: Secondary | ICD-10-CM

## 2020-07-07 DIAGNOSIS — Z794 Long term (current) use of insulin: Secondary | ICD-10-CM | POA: Diagnosis not present

## 2020-07-07 DIAGNOSIS — F4323 Adjustment disorder with mixed anxiety and depressed mood: Secondary | ICD-10-CM | POA: Diagnosis not present

## 2020-07-07 LAB — CBC WITH DIFFERENTIAL/PLATELET
Abs Immature Granulocytes: 0.02 10*3/uL (ref 0.00–0.07)
Basophils Absolute: 0.1 10*3/uL (ref 0.0–0.1)
Basophils Relative: 1 %
Eosinophils Absolute: 0 10*3/uL (ref 0.0–0.5)
Eosinophils Relative: 1 %
HCT: 44 % (ref 39.0–52.0)
Hemoglobin: 15 g/dL (ref 13.0–17.0)
Immature Granulocytes: 0 %
Lymphocytes Relative: 8 %
Lymphs Abs: 0.7 10*3/uL (ref 0.7–4.0)
MCH: 28.7 pg (ref 26.0–34.0)
MCHC: 34.1 g/dL (ref 30.0–36.0)
MCV: 84.1 fL (ref 80.0–100.0)
Monocytes Absolute: 0.3 10*3/uL (ref 0.1–1.0)
Monocytes Relative: 4 %
Neutro Abs: 7.4 10*3/uL (ref 1.7–7.7)
Neutrophils Relative %: 86 %
Platelets: 367 10*3/uL (ref 150–400)
RBC: 5.23 MIL/uL (ref 4.22–5.81)
RDW: 11.9 % (ref 11.5–15.5)
WBC: 8.5 10*3/uL (ref 4.0–10.5)
nRBC: 0 % (ref 0.0–0.2)

## 2020-07-07 LAB — BASIC METABOLIC PANEL
Anion gap: 11 (ref 5–15)
BUN: 20 mg/dL (ref 6–20)
CO2: 26 mmol/L (ref 22–32)
Calcium: 9.7 mg/dL (ref 8.9–10.3)
Chloride: 95 mmol/L — ABNORMAL LOW (ref 98–111)
Creatinine, Ser: 1.31 mg/dL — ABNORMAL HIGH (ref 0.61–1.24)
GFR, Estimated: >60 mL/min (ref >60)
Glucose, Bld: 286 mg/dL — ABNORMAL HIGH (ref 70–99)
Potassium: 4.8 mmol/L (ref 3.5–5.1)
Sodium: 132 mmol/L — ABNORMAL LOW (ref 135–145)

## 2020-07-07 LAB — MAGNESIUM: Magnesium: 1.8 mg/dL (ref 1.7–2.4)

## 2020-07-07 MED ORDER — LORAZEPAM 2 MG/ML IJ SOLN
1.0000 mg | Freq: Once | INTRAMUSCULAR | Status: AC
  Administered 2020-07-07: 1 mg via INTRAVENOUS
  Filled 2020-07-07: qty 1

## 2020-07-07 MED ORDER — ONDANSETRON 8 MG PO TBDP
8.0000 mg | ORAL_TABLET | Freq: Once | ORAL | Status: AC
Start: 2020-07-07 — End: 2020-07-07
  Administered 2020-07-07: 8 mg via ORAL
  Filled 2020-07-07: qty 1

## 2020-07-07 MED ORDER — GABAPENTIN 300 MG PO CAPS: 300.0000 mg | ORAL_CAPSULE | Freq: Three times a day (TID) | ORAL | 0 refills | Status: DC

## 2020-07-07 MED ORDER — HYDROMORPHONE HCL 2 MG/ML IJ SOLN
2.0000 mg | Freq: Once | INTRAMUSCULAR | Status: AC
  Administered 2020-07-07: 2 mg via INTRAMUSCULAR
  Filled 2020-07-07: qty 1

## 2020-07-07 MED ORDER — LORAZEPAM 2 MG/ML IJ SOLN
1.0000 mg | Freq: Once | INTRAMUSCULAR | Status: AC
  Administered 2020-07-07: 1 mg via INTRAMUSCULAR
  Filled 2020-07-07: qty 1

## 2020-07-07 MED ORDER — DIAZEPAM 10 MG PO TABS: 10.0000 mg | ORAL_TABLET | Freq: Four times a day (QID) | ORAL | 0 refills | Status: DC | PRN

## 2020-07-07 MED ORDER — HYDROMORPHONE HCL 1 MG/ML IJ SOLN
1.0000 mg | Freq: Once | INTRAMUSCULAR | Status: AC
  Administered 2020-07-07: 1 mg via INTRAVENOUS
  Filled 2020-07-07: qty 1

## 2020-07-07 MED ORDER — HYDROMORPHONE HCL 1 MG/ML IJ SOLN
1.0000 mg | Freq: Once | INTRAMUSCULAR | Status: AC
Start: 2020-07-07 — End: 2020-07-07
  Administered 2020-07-07: 1 mg via INTRAVENOUS
  Filled 2020-07-07: qty 1

## 2020-07-07 NOTE — ED Triage Notes (Signed)
Pt c/o cramping in right hand. Pt states he had three fingers amputated last month. Pt states his pain medication is not working.

## 2020-07-07 NOTE — ED Provider Notes (Signed)
[facility] EMERGENCY DEPARTMENT Provider Note   CSN: [id number] Arrival date & time: 07/07/20  0033     History Chief Complaint  Patient presents with  . Hand Pain    Eric [name] is a 35 y.o. male.  The history is provided by the patient.  Hand Pain  He has history of hypertension, diabetes and had traumatic amputations of his right index, long, ring fingers about 1 month ago.  Since then, he has been having problems with pain in his hand and has been on constant narcotics.  Narcotic dose was recently increased from oxycodone-acetaminophen 5-325 to oxycodone-acetaminophen 10-325.  Tonight, he started having severe pain in his hand.  He describes it as a cramping sensation which is mainly in the index and long fingers.  He took a dose of oxycodone-acetaminophen 10-325, but it was not giving him any relief.   Past Medical History:  Diagnosis Date  . Anxiety   . Depression   . Diabetes mellitus   . Hypertension     Patient Active Problem List   Diagnosis Date Noted  . Dehydration 03/31/2017  . Fever, unspecified 03/31/2017  . Viral gastroenteritis 03/30/2017  . Sinus tachycardia 03/30/2017  . DM type 1 (diabetes mellitus, type 1) (HCC) 02/01/2011  . Low back pain 02/01/2011  . Depression 02/01/2011  . Elevated blood pressure 02/01/2011    Past Surgical History:  Procedure Laterality Date  . AMPUTATION Right    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  . FRACTURE SURGERY     reconstructive left knee surgery  . KNEE SURGERY         Family History  Problem Relation Age of Onset  . Hypertension Mother   . Hypertension Father     Social History   Tobacco Use  . Smoking status: Never Smoker  . Smokeless tobacco: Current User    Types: Snuff  Vaping Use  . Vaping Use: Never used  Substance Use Topics  . Alcohol use: Yes    Comment: occ  . Drug use: No    Home Medications Prior to Admission medications   Medication Sig Start Date End Date Taking? Authorizing  Provider  albuterol (VENTOLIN HFA) 108 (90 Base) MCG/ACT inhaler Inhale 2 puffs into the lungs every 6 (six) hours as needed for wheezing or shortness of breath. 12/17/19   Maxwell Caul, PA-C  benzonatate (TESSALON) 100 MG capsule Take 1 capsule (100 mg total) by mouth every 8 (eight) hours. 12/17/19   Maxwell Caul, PA-C  cephALEXin (KEFLEX) 500 MG capsule Take 500 mg by mouth 4 (four) times daily. 06/11/20   [provider]  diazepam (VALIUM) 10 MG tablet Take 10 mg by mouth 2 (two) times daily as needed. 06/16/20   [provider]  diazepam (VALIUM) 5 MG tablet Take 5 mg by mouth 2 (two) times daily as needed for muscle spasms.  11/19/18   [provider]  escitalopram (LEXAPRO) 20 MG tablet Take 1 tablet by mouth daily. 04/05/20   [provider]  gabapentin (NEURONTIN) 300 MG capsule Take 300 mg by mouth 3 (three) times daily.  09/26/18   [provider]  HYDROcodone-acetaminophen (NORCO/VICODIN) 5-325 MG tablet Take 1 tablet by mouth every 4 (four) hours as needed for moderate pain.  11/28/18   [provider]  ibuprofen (ADVIL) 600 MG tablet Take 1 tablet (600 mg total) by mouth every 6 (six) hours as needed. 11/29/18   Burgess Amor, PA-C  insulin lispro (HUMALOG)  100 UNIT/ML injection Inject 1-10 Units into the skin 3 (three) times daily before meals. Patient taking differently: 1-10 Units 3 (three) times daily with meals. Based on sliding scale/carb intake-via pump 02/03/11   Elsaid, Hind I, MD  methocarbamol (ROBAXIN-750) 750 MG tablet Take 1 tablet (750 mg total) by mouth every 8 (eight) hours as needed for muscle spasms. 11/29/18   Burgess Amor, PA-C  oxyCODONE-acetaminophen (PERCOCET) 5-325 MG tablet Take 1-2 tablets by mouth every 6 (six) hours as needed. 06/19/20   Geoffery Lyons, MD    Allergies    Patient has no known allergies.  Review of Systems   Review of Systems  All other systems reviewed and are negative.   Physical  Exam Updated Vital Signs BP 127/85 (BP Location: Left Arm)   Pulse 99   Temp 98.4 F (36.9 C)   Resp (!) 22   Ht 5\' 9"  (1.753 m)   Wt 77.3 kg   SpO2 95%   BMI 25.17 kg/m   Physical Exam Vitals and nursing note reviewed.   35 year old male, appears uncomfortable, is pacing the floor, but is in no acute distress. Vital signs are significant for elevated respiratory rate. Oxygen saturation is 95%, which is normal. Head is normocephalic and atraumatic. PERRLA, EOMI. Oropharynx is clear. Neck is nontender and supple without adenopathy or JVD. Back is nontender and there is no CVA tenderness. Lungs are clear without rales, wheezes, or rhonchi. Chest is nontender. Heart has regular rate and rhythm without murmur. Abdomen is soft, flat, nontender without masses or hepatosplenomegaly and peristalsis is normoactive. Extremities: Dressing is in place on the right hand.  He is status post amputation of the right index, middle, ring fingers at the PIP joints. Skin is warm and dry without rash. Neurologic: Mental status is normal, cranial nerves are intact, there are no motor or sensory deficits.  ED Results / Procedures / Treatments   Labs (all labs ordered are listed, but only abnormal results are displayed) Labs Reviewed  BASIC METABOLIC PANEL - Abnormal; Notable for the following components:      Result Value   Sodium 132 (*)    Chloride 95 (*)    Glucose, Bld 286 (*)    Creatinine, Ser 1.31 (*)    All other components within normal limits  CBC WITH DIFFERENTIAL/PLATELET  MAGNESIUM   Procedures Procedures   Medications Ordered in ED Medications  ondansetron (ZOFRAN-ODT) disintegrating tablet 8 mg (8 mg Oral Given 07/07/20 0051)  HYDROmorphone (DILAUDID) injection 2 mg (2 mg Intramuscular Given 07/07/20 0051)  HYDROmorphone (DILAUDID) injection 1 mg (1 mg Intravenous Given 07/07/20 0158)  HYDROmorphone (DILAUDID) injection 1 mg (1 mg Intravenous Given 07/07/20 0301)  LORazepam  (ATIVAN) injection 1 mg (1 mg Intravenous Given 07/07/20 0301)    ED Course  I have reviewed the triage vital signs and the nursing notes.  Pertinent lab results that were available during my care of the patient were reviewed by me and considered in my medical decision making (see chart for details).   MDM Rules/Calculators/A&P                         Ongoing right hand pain following traumatic amputation of right second, third ongoing fourth fingers.  Old records are reviewed confirming hospitalization for crush injury of hand with amputations as noted above, ongoing issues with pain control.  Initially was unable to sit still long enough to get a set of vital  signs and was given an injection of hydromorphone intramuscularly which gave slight relief.  We will check labs including electrolytes, give intravenous hydromorphone.  Metabolic panel shows mild renal insufficiency, mild hyponatremia.  He got only slight relief with hydromorphone and is given additional hydromorphone which again only gave mild relief.  He continued to complain of muscle spasms.  He was given lorazepam with significant improvement in pain.  Discharged with prescription for diazepam and is instructed to follow-up with his PCP and also with his hand specialist.  Final Clinical Impression(s) / ED Diagnoses Final diagnoses:  Right hand pain  Hyponatremia    Rx / DC Orders ED Discharge Orders         Ordered    diazepam (VALIUM) 10 MG tablet  Every 6 hours PRN        07/07/20 0423           Dione Booze, MD 07/07/20 [phone]

## 2020-07-07 NOTE — ED Triage Notes (Signed)
Pt c/o right hand cramping; amputation of 3 fingers; pt reports he has been taking hydrocodone 10-325 1 tab q6h with no relief; seen for same this am

## 2020-07-07 NOTE — Discharge Instructions (Addendum)
Pick up the valium you were prescribed from your visit here last night.  This is a muscle relaxer.  You may also add the gabapentin we have just also prescribed - this medicine helps to control nerve pain which may be the source of your worsening pain.  Keep your followup appointments with Dr. Alessandra Bevels.

## 2020-07-07 NOTE — ED Notes (Signed)
Unable to obtain vitals at this time, Pt is unable to sit in bed due to c/o pain. Will reassess vitals at later time.

## 2020-07-07 NOTE — ED Provider Notes (Signed)
[facility] EMERGENCY DEPARTMENT Provider Note   CSN: [id number] Arrival date & time: 07/07/20  1208     History Chief Complaint  Patient presents with  . Hand Pain    Eric [name] is a 35 y.o. male with a history of type 1 diabetes, who is currently 1 month out from a crush injury to his right hand resulting in traumatic amputations of his right index long and ring fingers at that time by Dr. Alessandra [name] in [facility] reporting increasing pain in these fingers.  He was seen here last night for the same complaint.  He is currently taking oxycodone 10/325 mg which is an increase from his previous 5/325 mg strength.  He states this dose was not improving his symptoms.  He describes a muscle spasm quality in his fingers and hand.  He was given an IV dose of Ativan last night which completely resolved his symptoms.  He was also prescribed Valium for home use, but he has not picked up this medication yet, states he did not know he had a prescription available. He denies any new injury, also no fevers or chills, no drainage from the fingers.  He is scheduled for close follow-up with his orthopedic surgeon next week.  Review of his chart reveals that he had an office visit with Dr. Alessandra [name] just 3 days ago at which time was documented that he had necrotic tips of his amputated fingers and may need to undergo revision of his amputations given poor prognosis with his escalating pain,necrosis and uncontrolled blood glucose levels.  Patient is requesting transferring his care to a [facility] physician which will be closer for him and Dr. Alessandra [name] is currently trying to arrange this, awaiting workers comp approval.   The history is provided by the patient.       Past Medical History:  Diagnosis Date  . Anxiety   . Depression   . Diabetes mellitus   . Hypertension     Patient Active Problem List   Diagnosis Date Noted  . Dehydration 03/31/2017  . Fever, unspecified 03/31/2017  . Viral  gastroenteritis 03/30/2017  . Sinus tachycardia 03/30/2017  . DM type 1 (diabetes mellitus, type 1) (HCC) 02/01/2011  . Low back pain 02/01/2011  . Depression 02/01/2011  . Elevated blood pressure 02/01/2011    Past Surgical History:  Procedure Laterality Date  . AMPUTATION Right    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  . FRACTURE SURGERY     reconstructive left knee surgery  . KNEE SURGERY         Family History  Problem Relation Age of Onset  . Hypertension Mother   . Hypertension Father     Social History   Tobacco Use  . Smoking status: Never Smoker  . Smokeless tobacco: Current User    Types: Snuff  Vaping Use  . Vaping Use: Never used  Substance Use Topics  . Alcohol use: Yes    Comment: occ  . Drug use: No    Home Medications Prior to Admission medications   Medication Sig Start Date End Date Taking? Authorizing Provider  gabapentin (NEURONTIN) 300 MG capsule Take 1 capsule (300 mg total) by mouth 3 (three) times daily. 07/07/20  Yes Viviano Bir, Raynelle Fanning, PA-C  albuterol (VENTOLIN HFA) 108 (90 Base) MCG/ACT inhaler Inhale 2 puffs into the lungs every 6 (six) hours as needed for wheezing or shortness of breath. 12/17/19   Graciella Freer A, PA-C  diazepam (VALIUM) 10 MG tablet Take 1  tablet (10 mg total) by mouth every 6 (six) hours as needed. 07/07/20   Dione Booze, MD  escitalopram (LEXAPRO) 20 MG tablet Take 1 tablet by mouth daily. 04/05/20   [provider]  HYDROcodone-acetaminophen (NORCO/VICODIN) 5-325 MG tablet Take 1 tablet by mouth every 4 (four) hours as needed for moderate pain.  11/28/18   [provider]  ibuprofen (ADVIL) 600 MG tablet Take 1 tablet (600 mg total) by mouth every 6 (six) hours as needed. 11/29/18   Jaiven Graveline, Raynelle Fanning, PA-C  insulin lispro (HUMALOG) 100 UNIT/ML injection Inject 1-10 Units into the skin 3 (three) times daily before meals. Patient taking differently: 1-10 Units 3 (three) times daily with meals. Based on sliding scale/carb  intake-via pump 02/03/11   Elsaid, Hind I, MD  methocarbamol (ROBAXIN-750) 750 MG tablet Take 1 tablet (750 mg total) by mouth every 8 (eight) hours as needed for muscle spasms. 11/29/18   Burgess Amor, PA-C  oxyCODONE-acetaminophen (PERCOCET) 5-325 MG tablet Take 1-2 tablets by mouth every 6 (six) hours as needed. 06/19/20   Geoffery Lyons, MD    Allergies    Patient has no known allergies.  Review of Systems   Review of Systems  Constitutional: Negative for fever.  Musculoskeletal: Positive for arthralgias. Negative for joint swelling and myalgias.  Skin: Positive for color change and wound.  Neurological: Negative for weakness and numbness.    Physical Exam Updated Vital Signs BP (!) 139/94   Pulse 88   Temp 97.6 F (36.4 C) (Oral)   Resp 20   Ht 5\' 9"  (1.753 m)   Wt 77.3 kg   SpO2 100%   BMI 25.17 kg/m   Physical Exam Constitutional:      Appearance: He is well-developed.  HENT:     Head: Atraumatic.  Cardiovascular:     Comments: Pulses equal bilaterally Musculoskeletal:        General: Tenderness present.     Cervical back: Normal range of motion.  Skin:    General: Skin is warm and dry.     Findings: No erythema.     Comments: No necrotic tissue noted at all 3 finger amputated distal sites.  There is no drainage from these wounds, skin is dry.  No proximal erythema or edema.  Neurological:     Mental Status: He is alert.     Sensory: No sensory deficit.     Deep Tendon Reflexes: Reflexes normal.     ED Results / Procedures / Treatments   Labs (all labs ordered are listed, but only abnormal results are displayed) Labs Reviewed - No data to display  EKG None  Radiology No results found.  Procedures Procedures   Medications Ordered in ED Medications  LORazepam (ATIVAN) injection 1 mg (1 mg Intramuscular Given 07/07/20 1315)    ED Course  I have reviewed the triage vital signs and the nursing notes.  Pertinent labs & imaging results that were  available during my care of the patient were reviewed by me and considered in my medical decision making (see chart for details).    MDM Rules/Calculators/A&P                          Labs from last night were reviewed and not repeated during this reevaluation.  His glucose was 286, without an anion gap.  He did get significant relief from Ativan at last night's visit, he was given an additional dose while here.  He was  encouraged to get this prescription filled which should be at his pharmacy for him.  Additionally we started him on gabapentin 300 mg 3 times daily as he may be also experiencing nerve pain associated with this chronic injury.  Plan follow-up with his orthopedic provider in [place].  Patient was seen by Dr. Denton Lank during this ED visit. Final Clinical Impression(s) / ED Diagnoses Final diagnoses:  Hand pain, right  Amputation finger, subsequent encounter    Rx / DC Orders ED Discharge Orders         Ordered    gabapentin (NEURONTIN) 300 MG capsule  3 times daily        07/07/20 1404           Burgess Amor, Cordelia Poche 07/07/20 1435    Cathren Laine, MD 07/13/20 1358

## 2020-07-07 NOTE — Discharge Instructions (Addendum)
Continue taking your pain medication as prescribed.  Continue working with your orthopedic doctor.  Return to the emergency department if pain is not being adequately controlled at home.

## 2020-07-14 DIAGNOSIS — F4323 Adjustment disorder with mixed anxiety and depressed mood: Secondary | ICD-10-CM | POA: Diagnosis not present

## 2020-07-15 DIAGNOSIS — E1065 Type 1 diabetes mellitus with hyperglycemia: Secondary | ICD-10-CM | POA: Diagnosis not present

## 2020-07-15 DIAGNOSIS — E782 Mixed hyperlipidemia: Secondary | ICD-10-CM | POA: Diagnosis not present

## 2020-07-16 DIAGNOSIS — F419 Anxiety disorder, unspecified: Secondary | ICD-10-CM | POA: Diagnosis not present

## 2020-07-16 DIAGNOSIS — S67190S Crushing injury of right index finger, sequela: Secondary | ICD-10-CM | POA: Diagnosis not present

## 2020-07-16 DIAGNOSIS — S68110S Complete traumatic metacarpophalangeal amputation of right index finger, sequela: Secondary | ICD-10-CM | POA: Diagnosis not present

## 2020-07-16 DIAGNOSIS — R7982 Elevated C-reactive protein (CRP): Secondary | ICD-10-CM | POA: Diagnosis not present

## 2020-07-16 DIAGNOSIS — Z043 Encounter for examination and observation following other accident: Secondary | ICD-10-CM | POA: Diagnosis not present

## 2020-07-16 DIAGNOSIS — M8620 Subacute osteomyelitis, unspecified site: Secondary | ICD-10-CM | POA: Diagnosis not present

## 2020-07-16 DIAGNOSIS — M5030 Other cervical disc degeneration, unspecified cervical region: Secondary | ICD-10-CM | POA: Diagnosis not present

## 2020-07-16 DIAGNOSIS — S68114S Complete traumatic metacarpophalangeal amputation of right ring finger, sequela: Secondary | ICD-10-CM | POA: Diagnosis not present

## 2020-07-16 DIAGNOSIS — S67194S Crushing injury of right ring finger, sequela: Secondary | ICD-10-CM | POA: Diagnosis not present

## 2020-07-16 DIAGNOSIS — X58XXXS Exposure to other specified factors, sequela: Secondary | ICD-10-CM | POA: Diagnosis not present

## 2020-07-16 DIAGNOSIS — E1069 Type 1 diabetes mellitus with other specified complication: Secondary | ICD-10-CM | POA: Diagnosis not present

## 2020-07-16 DIAGNOSIS — Z20822 Contact with and (suspected) exposure to covid-19: Secondary | ICD-10-CM | POA: Diagnosis not present

## 2020-07-16 DIAGNOSIS — Z6823 Body mass index (BMI) 23.0-23.9, adult: Secondary | ICD-10-CM | POA: Diagnosis not present

## 2020-07-16 DIAGNOSIS — S68112S Complete traumatic metacarpophalangeal amputation of right middle finger, sequela: Secondary | ICD-10-CM | POA: Diagnosis not present

## 2020-07-16 DIAGNOSIS — L539 Erythematous condition, unspecified: Secondary | ICD-10-CM | POA: Diagnosis not present

## 2020-07-16 DIAGNOSIS — Z87891 Personal history of nicotine dependence: Secondary | ICD-10-CM | POA: Diagnosis not present

## 2020-07-16 DIAGNOSIS — M1990 Unspecified osteoarthritis, unspecified site: Secondary | ICD-10-CM | POA: Diagnosis not present

## 2020-07-16 DIAGNOSIS — E1052 Type 1 diabetes mellitus with diabetic peripheral angiopathy with gangrene: Secondary | ICD-10-CM | POA: Diagnosis not present

## 2020-07-16 DIAGNOSIS — S67192S Crushing injury of right middle finger, sequela: Secondary | ICD-10-CM | POA: Diagnosis not present

## 2020-07-16 DIAGNOSIS — M86241 Subacute osteomyelitis, right hand: Secondary | ICD-10-CM | POA: Diagnosis not present

## 2020-07-17 DIAGNOSIS — F419 Anxiety disorder, unspecified: Secondary | ICD-10-CM | POA: Diagnosis not present

## 2020-07-17 DIAGNOSIS — E1069 Type 1 diabetes mellitus with other specified complication: Secondary | ICD-10-CM | POA: Diagnosis not present

## 2020-07-17 DIAGNOSIS — M5030 Other cervical disc degeneration, unspecified cervical region: Secondary | ICD-10-CM | POA: Diagnosis not present

## 2020-07-17 DIAGNOSIS — S67190S Crushing injury of right index finger, sequela: Secondary | ICD-10-CM | POA: Diagnosis not present

## 2020-07-17 DIAGNOSIS — S68114S Complete traumatic metacarpophalangeal amputation of right ring finger, sequela: Secondary | ICD-10-CM | POA: Diagnosis not present

## 2020-07-17 DIAGNOSIS — X58XXXS Exposure to other specified factors, sequela: Secondary | ICD-10-CM | POA: Diagnosis not present

## 2020-07-17 DIAGNOSIS — Z20822 Contact with and (suspected) exposure to covid-19: Secondary | ICD-10-CM | POA: Diagnosis not present

## 2020-07-17 DIAGNOSIS — R7982 Elevated C-reactive protein (CRP): Secondary | ICD-10-CM | POA: Diagnosis not present

## 2020-07-17 DIAGNOSIS — S68110S Complete traumatic metacarpophalangeal amputation of right index finger, sequela: Secondary | ICD-10-CM | POA: Diagnosis not present

## 2020-07-17 DIAGNOSIS — S67192S Crushing injury of right middle finger, sequela: Secondary | ICD-10-CM | POA: Diagnosis not present

## 2020-07-17 DIAGNOSIS — Z043 Encounter for examination and observation following other accident: Secondary | ICD-10-CM | POA: Diagnosis not present

## 2020-07-17 DIAGNOSIS — M1990 Unspecified osteoarthritis, unspecified site: Secondary | ICD-10-CM | POA: Diagnosis not present

## 2020-07-17 DIAGNOSIS — S67194S Crushing injury of right ring finger, sequela: Secondary | ICD-10-CM | POA: Diagnosis not present

## 2020-07-17 DIAGNOSIS — E1052 Type 1 diabetes mellitus with diabetic peripheral angiopathy with gangrene: Secondary | ICD-10-CM | POA: Diagnosis not present

## 2020-07-17 DIAGNOSIS — M86241 Subacute osteomyelitis, right hand: Secondary | ICD-10-CM | POA: Diagnosis not present

## 2020-07-17 DIAGNOSIS — S68112S Complete traumatic metacarpophalangeal amputation of right middle finger, sequela: Secondary | ICD-10-CM | POA: Diagnosis not present

## 2020-07-17 DIAGNOSIS — Z87891 Personal history of nicotine dependence: Secondary | ICD-10-CM | POA: Diagnosis not present

## 2020-07-18 DIAGNOSIS — I96 Gangrene, not elsewhere classified: Secondary | ICD-10-CM | POA: Diagnosis not present

## 2020-07-18 DIAGNOSIS — S6721XS Crushing injury of right hand, sequela: Secondary | ICD-10-CM | POA: Diagnosis not present

## 2020-07-18 DIAGNOSIS — S68114S Complete traumatic metacarpophalangeal amputation of right ring finger, sequela: Secondary | ICD-10-CM | POA: Diagnosis not present

## 2020-07-18 DIAGNOSIS — F4323 Adjustment disorder with mixed anxiety and depressed mood: Secondary | ICD-10-CM | POA: Diagnosis not present

## 2020-07-18 DIAGNOSIS — S67192S Crushing injury of right middle finger, sequela: Secondary | ICD-10-CM | POA: Diagnosis not present

## 2020-07-18 DIAGNOSIS — T402X5A Adverse effect of other opioids, initial encounter: Secondary | ICD-10-CM | POA: Diagnosis not present

## 2020-07-18 DIAGNOSIS — S67190S Crushing injury of right index finger, sequela: Secondary | ICD-10-CM | POA: Diagnosis not present

## 2020-07-18 DIAGNOSIS — X58XXXS Exposure to other specified factors, sequela: Secondary | ICD-10-CM | POA: Diagnosis not present

## 2020-07-18 DIAGNOSIS — S6991XA Unspecified injury of right wrist, hand and finger(s), initial encounter: Secondary | ICD-10-CM | POA: Diagnosis not present

## 2020-07-18 DIAGNOSIS — W319XXA Contact with unspecified machinery, initial encounter: Secondary | ICD-10-CM | POA: Diagnosis not present

## 2020-07-18 DIAGNOSIS — M6250 Muscle wasting and atrophy, not elsewhere classified, unspecified site: Secondary | ICD-10-CM | POA: Diagnosis not present

## 2020-07-18 DIAGNOSIS — E1069 Type 1 diabetes mellitus with other specified complication: Secondary | ICD-10-CM | POA: Diagnosis not present

## 2020-07-18 DIAGNOSIS — K5903 Drug induced constipation: Secondary | ICD-10-CM | POA: Diagnosis not present

## 2020-07-18 DIAGNOSIS — S68112S Complete traumatic metacarpophalangeal amputation of right middle finger, sequela: Secondary | ICD-10-CM | POA: Diagnosis not present

## 2020-07-18 DIAGNOSIS — M86141 Other acute osteomyelitis, right hand: Secondary | ICD-10-CM | POA: Diagnosis not present

## 2020-07-18 DIAGNOSIS — F32A Depression, unspecified: Secondary | ICD-10-CM | POA: Diagnosis not present

## 2020-07-18 DIAGNOSIS — Z794 Long term (current) use of insulin: Secondary | ICD-10-CM | POA: Diagnosis not present

## 2020-07-18 DIAGNOSIS — Z9641 Presence of insulin pump (external) (internal): Secondary | ICD-10-CM | POA: Diagnosis not present

## 2020-07-18 DIAGNOSIS — L089 Local infection of the skin and subcutaneous tissue, unspecified: Secondary | ICD-10-CM | POA: Diagnosis not present

## 2020-07-18 DIAGNOSIS — E109 Type 1 diabetes mellitus without complications: Secondary | ICD-10-CM | POA: Diagnosis not present

## 2020-07-18 DIAGNOSIS — T8741 Infection of amputation stump, right upper extremity: Secondary | ICD-10-CM | POA: Diagnosis not present

## 2020-07-18 DIAGNOSIS — E1065 Type 1 diabetes mellitus with hyperglycemia: Secondary | ICD-10-CM | POA: Diagnosis not present

## 2020-07-18 DIAGNOSIS — F419 Anxiety disorder, unspecified: Secondary | ICD-10-CM | POA: Diagnosis not present

## 2020-07-18 DIAGNOSIS — Z79899 Other long term (current) drug therapy: Secondary | ICD-10-CM | POA: Diagnosis not present

## 2020-07-18 DIAGNOSIS — R7982 Elevated C-reactive protein (CRP): Secondary | ICD-10-CM | POA: Diagnosis not present

## 2020-07-18 DIAGNOSIS — E1042 Type 1 diabetes mellitus with diabetic polyneuropathy: Secondary | ICD-10-CM | POA: Diagnosis not present

## 2020-07-18 DIAGNOSIS — M86241 Subacute osteomyelitis, right hand: Secondary | ICD-10-CM | POA: Diagnosis not present

## 2020-07-18 DIAGNOSIS — I10 Essential (primary) hypertension: Secondary | ICD-10-CM | POA: Diagnosis not present

## 2020-07-18 DIAGNOSIS — L409 Psoriasis, unspecified: Secondary | ICD-10-CM | POA: Diagnosis not present

## 2020-07-18 DIAGNOSIS — S6721XA Crushing injury of right hand, initial encounter: Secondary | ICD-10-CM | POA: Diagnosis not present

## 2020-07-18 DIAGNOSIS — R52 Pain, unspecified: Secondary | ICD-10-CM | POA: Diagnosis not present

## 2020-07-18 DIAGNOSIS — Z9889 Other specified postprocedural states: Secondary | ICD-10-CM | POA: Diagnosis not present

## 2020-07-18 DIAGNOSIS — G8918 Other acute postprocedural pain: Secondary | ICD-10-CM | POA: Diagnosis not present

## 2020-07-18 DIAGNOSIS — S68110S Complete traumatic metacarpophalangeal amputation of right index finger, sequela: Secondary | ICD-10-CM | POA: Diagnosis not present

## 2020-07-18 DIAGNOSIS — L0390 Cellulitis, unspecified: Secondary | ICD-10-CM | POA: Diagnosis not present

## 2020-07-18 DIAGNOSIS — E10649 Type 1 diabetes mellitus with hypoglycemia without coma: Secondary | ICD-10-CM | POA: Diagnosis not present

## 2020-07-18 DIAGNOSIS — M1990 Unspecified osteoarthritis, unspecified site: Secondary | ICD-10-CM | POA: Diagnosis not present

## 2020-07-18 DIAGNOSIS — M79641 Pain in right hand: Secondary | ICD-10-CM | POA: Diagnosis not present

## 2020-07-18 DIAGNOSIS — S67194S Crushing injury of right ring finger, sequela: Secondary | ICD-10-CM | POA: Diagnosis not present

## 2020-07-18 DIAGNOSIS — R531 Weakness: Secondary | ICD-10-CM | POA: Diagnosis not present

## 2020-07-18 DIAGNOSIS — Z87891 Personal history of nicotine dependence: Secondary | ICD-10-CM | POA: Diagnosis not present

## 2020-07-18 DIAGNOSIS — M5030 Other cervical disc degeneration, unspecified cervical region: Secondary | ICD-10-CM | POA: Diagnosis not present

## 2020-07-18 DIAGNOSIS — E1052 Type 1 diabetes mellitus with diabetic peripheral angiopathy with gangrene: Secondary | ICD-10-CM | POA: Diagnosis not present

## 2020-07-18 DIAGNOSIS — Z20822 Contact with and (suspected) exposure to covid-19: Secondary | ICD-10-CM | POA: Diagnosis not present

## 2020-07-19 DIAGNOSIS — L089 Local infection of the skin and subcutaneous tissue, unspecified: Secondary | ICD-10-CM | POA: Diagnosis not present

## 2020-07-19 DIAGNOSIS — G8918 Other acute postprocedural pain: Secondary | ICD-10-CM | POA: Diagnosis not present

## 2020-07-19 DIAGNOSIS — M86141 Other acute osteomyelitis, right hand: Secondary | ICD-10-CM | POA: Diagnosis not present

## 2020-07-19 DIAGNOSIS — M79641 Pain in right hand: Secondary | ICD-10-CM | POA: Diagnosis not present

## 2020-07-25 DIAGNOSIS — F4323 Adjustment disorder with mixed anxiety and depressed mood: Secondary | ICD-10-CM | POA: Diagnosis not present

## 2020-07-27 DIAGNOSIS — T1490XD Injury, unspecified, subsequent encounter: Secondary | ICD-10-CM | POA: Diagnosis not present

## 2020-07-27 DIAGNOSIS — T23361D Burn of third degree of back of right hand, subsequent encounter: Secondary | ICD-10-CM | POA: Diagnosis not present

## 2020-07-27 DIAGNOSIS — F322 Major depressive disorder, single episode, severe without psychotic features: Secondary | ICD-10-CM | POA: Diagnosis not present

## 2020-07-28 DIAGNOSIS — F4323 Adjustment disorder with mixed anxiety and depressed mood: Secondary | ICD-10-CM | POA: Diagnosis not present

## 2020-07-29 DIAGNOSIS — E109 Type 1 diabetes mellitus without complications: Secondary | ICD-10-CM | POA: Diagnosis not present

## 2020-07-29 DIAGNOSIS — Z4681 Encounter for fitting and adjustment of insulin pump: Secondary | ICD-10-CM | POA: Diagnosis not present

## 2020-08-02 DIAGNOSIS — F4323 Adjustment disorder with mixed anxiety and depressed mood: Secondary | ICD-10-CM | POA: Diagnosis not present

## 2020-08-07 ENCOUNTER — Emergency Department (HOSPITAL_COMMUNITY)
Admission: EM | Admit: 2020-08-07 | Discharge: 2020-08-07 | Disposition: A | Discharge status: Home / Self Care | Payer: BC Managed Care – PPO | Attending: Emergency Medicine | Admitting: Emergency Medicine

## 2020-08-07 ENCOUNTER — Other Ambulatory Visit: Payer: Self-pay

## 2020-08-07 ENCOUNTER — Encounter (HOSPITAL_COMMUNITY): Payer: Self-pay

## 2020-08-07 ENCOUNTER — Emergency Department (HOSPITAL_COMMUNITY): Payer: BC Managed Care – PPO

## 2020-08-07 DIAGNOSIS — S62636A Displaced fracture of distal phalanx of right little finger, initial encounter for closed fracture: Secondary | ICD-10-CM | POA: Insufficient documentation

## 2020-08-07 DIAGNOSIS — Z794 Long term (current) use of insulin: Secondary | ICD-10-CM | POA: Diagnosis not present

## 2020-08-07 DIAGNOSIS — I10 Essential (primary) hypertension: Secondary | ICD-10-CM | POA: Diagnosis not present

## 2020-08-07 DIAGNOSIS — S6991XA Unspecified injury of right wrist, hand and finger(s), initial encounter: Secondary | ICD-10-CM | POA: Diagnosis not present

## 2020-08-07 DIAGNOSIS — Z89421 Acquired absence of other right toe(s): Secondary | ICD-10-CM | POA: Diagnosis not present

## 2020-08-07 DIAGNOSIS — E109 Type 1 diabetes mellitus without complications: Secondary | ICD-10-CM | POA: Insufficient documentation

## 2020-08-07 DIAGNOSIS — W230XXA Caught, crushed, jammed, or pinched between moving objects, initial encounter: Secondary | ICD-10-CM | POA: Diagnosis not present

## 2020-08-07 DIAGNOSIS — F17290 Nicotine dependence, other tobacco product, uncomplicated: Secondary | ICD-10-CM | POA: Insufficient documentation

## 2020-08-07 MED ORDER — OXYCODONE-ACETAMINOPHEN 5-325 MG PO TABS
1.0000 | ORAL_TABLET | Freq: Once | ORAL | Status: AC
  Administered 2020-08-07: 1 via ORAL
  Filled 2020-08-07: qty 1

## 2020-08-07 NOTE — Discharge Instructions (Addendum)
You were seen today for ongoing pain in your hand and pinky.  You have a new fracture of the distal phalanx.  Follow-up with your hand surgeon on Monday as scheduled.  He recently had a prescription filled for 1 week course of oxycodone.  I cannot provide further pain medication at this time.

## 2020-08-07 NOTE — ED Triage Notes (Signed)
Pt with recent amputation to digits on R hand (d/t work injury) and had surgery on R hand in June. Pt states he has been placed on a muscle relaxer but is having phantom pain to the missing digits on his R hand.

## 2020-08-07 NOTE — ED Provider Notes (Signed)
[facility] EMERGENCY DEPARTMENT Provider Note   CSN: [id number] Arrival date & time: 08/07/20  0147     History Chief Complaint  Patient presents with   Phantom Pain    Eric [name] is a 35 y.o. male.  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  Patient reports he has had increasing "phantom pain" tonight.  He has had similar pain in the past but not this bad.  He states he normally improves with muscle relaxants but did not improve this evening.  The pain is at the end of his surgical site.  He denies any drainage from the surgical site.  Has not noted any redness.  No fevers.  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  He rates his pain at 8 out of 10.  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   Dehydration 03/31/2017   Fever, unspecified 03/31/2017   Viral gastroenteritis 03/30/2017   Sinus tachycardia 03/30/2017   Type 1 diabetes mellitus on insulin therapy (HCC) 02/01/2011   Low back pain 02/01/2011   Depression 02/01/2011   Elevated blood pressure 02/01/2011    Past Surgical History:  Procedure Laterality Date   AMPUTATION Right    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   FRACTURE SURGERY     reconstructive left knee surgery   KNEE SURGERY         Family History  Problem Relation Age of Onset   Hypertension Mother    Hypertension Father     Social History   Tobacco Use   Smoking status:  Never   Smokeless tobacco: Current    Types: Snuff  Vaping Use   Vaping Use: Never used  Substance Use Topics   Alcohol use: Yes    Comment: occ   Drug use: No    Home Medications Prior to Admission medications   Medication Sig Start Date End Date Taking? Authorizing Provider  albuterol (VENTOLIN HFA) 108 (90 Base) MCG/ACT inhaler Inhale 2 puffs into the lungs every 6 (six) hours as needed for wheezing or shortness of breath. 12/17/19   Maxwell Caul, PA-C  diazepam (VALIUM) 10 MG tablet Take 1 tablet (10 mg total) by mouth every 6 (six) hours as needed. 07/07/20   Dione Booze, MD  escitalopram (LEXAPRO) 20 MG tablet Take 1 tablet by mouth daily. 04/05/20   [provider]  gabapentin (NEURONTIN) 300 MG capsule Take 1 capsule (300 mg total) by mouth 3 (three) times daily. 07/07/20   Burgess Amor, PA-C  HYDROcodone-acetaminophen (NORCO/VICODIN) 5-325 MG tablet Take 1 tablet by mouth every 4 (four) hours as needed for moderate pain.  11/28/18   [provider]  ibuprofen (ADVIL) 600 MG tablet Take 1 tablet (600 mg total) by mouth every 6 (six) hours as needed. 11/29/18   Burgess Amor, PA-C  insulin lispro (HUMALOG) 100 UNIT/ML injection Inject 1-10 Units into the  skin 3 (three) times daily before meals. Patient taking differently: 1-10 Units 3 (three) times daily with meals. Based on sliding scale/carb intake-via pump 02/03/11   Elsaid, Hind I, MD  methocarbamol (ROBAXIN-750) 750 MG tablet Take 1 tablet (750 mg total) by mouth every 8 (eight) hours as needed for muscle spasms. 11/29/18   Burgess Amor, PA-C  oxyCODONE-acetaminophen (PERCOCET) 5-325 MG tablet Take 1-2 tablets by mouth every 6 (six) hours as needed. 06/19/20   Geoffery Lyons, MD    Allergies    Patient has no known allergies.  Review of Systems   Review of Systems  Constitutional:  Negative for fever.  Musculoskeletal:        Right hand pain  Skin:  Positive for wound. Negative for color change.  All other  systems reviewed and are negative.  Physical Exam Updated Vital Signs BP 130/83 (BP Location: Left Arm)   Pulse 94   Temp 97.7 F (36.5 C) (Oral)   Resp 18   Ht 1.753 m (5\' 9" )   Wt 77.3 kg   SpO2 99%   BMI 25.17 kg/m   Physical Exam Vitals and nursing note reviewed.  Constitutional:      Appearance: He is well-developed. He is not ill-appearing.  HENT:     Head: Normocephalic and atraumatic.     Nose: Nose normal.     Mouth/Throat:     Mouth: Mucous membranes are moist.  Eyes:     Pupils: Pupils are equal, round, and reactive to light.  Cardiovascular:     Rate and Rhythm: Normal rate and regular rhythm.  Pulmonary:     Effort: Pulmonary effort is normal. No respiratory distress.  Abdominal:     Palpations: Abdomen is soft.     Tenderness: There is no abdominal tenderness.  Musculoskeletal:     Cervical back: Neck supple.     Comments: Dressing removed, well-healing surgical site across the base of the second through fourth digits, no adjacent erythema, no drainage, sutures in place  Skin:    General: Skin is warm and dry.  Neurological:     Mental Status: He is alert and oriented to person, place, and time.  Psychiatric:        Mood and Affect: Mood normal.    ED Results / Procedures / Treatments   Labs (all labs ordered are listed, but only abnormal results are displayed) Labs Reviewed - No data to display  EKG None  Radiology DG Hand Complete Right  Result Date: 08/07/2020 CLINICAL DATA:  In injury, hand pain EXAM: RIGHT HAND - COMPLETE 3+ VIEW COMPARISON:  None. FINDINGS: There has been amputation of the right second, third, and fourth digits distal to the MCP joints. There is a a comminuted fracture of the distal phalanx of the residual right fifth digit. There is deformity of the nailbed of the right thumb. No osseous erosions identified. No retained radiopaque foreign body identified. IMPRESSION: Acute appearing comminuted fracture of the distal phalanx  of the right fifth digit. Amputation of the a right second through fourth digits distal to the MCP joints. Electronically Signed   By: 08/09/2020 MD   On: 08/07/2020 04:17    Procedures Procedures   Medications Ordered in ED Medications  oxyCODONE-acetaminophen (PERCOCET/ROXICET) 5-325 MG per tablet 1 tablet (1 tablet Oral Given 08/07/20 08/09/20)    ED Course  I have reviewed the triage vital signs and the nursing notes.  Pertinent labs & imaging results that were available during my  care of the patient were reviewed by me and considered in my medical decision making (see chart for details).    MDM Rules/Calculators/A&P                          Patient presents with pain in the right hand.  Recent crush injury and multiple surgeries requiring amputation.  He is overall nontoxic and vital signs are reassuring.  He is afebrile.  Wound appears well-healing without adjacent erythema.  Sutures are in place.  Doubt cellulitis.  Will obtain an x-ray to ensure no ongoing osteomyelitis.  X-ray is largely reassuring although it does show likely an acute fifth distal phalanx fracture.  Patient reports at baseline he is numb in that finger.  He does not recall reinjuring the finger.  He has never been told there was a fracture there.  He has some chronic wounds at the tip of the finger but nothing acute.  Will place in a splint and rewrap.  He was given a Percocet for pain.  I have reviewed the [facility] narcotic database.  He had a prescription filled on 6/19 for 7-day course of oxycodone 15 mg.  For this reason, he will not receive further prescription from me.  Patient encouraged to follow-up with hand surgeon.  After history, exam, and medical workup I feel the patient has been appropriately medically screened and is safe for discharge home. Pertinent diagnoses were discussed with the patient. Patient was given return precautions.  Final Clinical Impression(s) / ED Diagnoses Final diagnoses:   Displaced fracture of distal phalanx of right little finger, initial encounter for closed fracture    Rx / DC Orders ED Discharge Orders     None        Shon Baton, MD 08/07/20 [phone]

## 2020-08-08 DIAGNOSIS — E109 Type 1 diabetes mellitus without complications: Secondary | ICD-10-CM | POA: Diagnosis not present

## 2020-08-08 DIAGNOSIS — L089 Local infection of the skin and subcutaneous tissue, unspecified: Secondary | ICD-10-CM | POA: Diagnosis not present

## 2020-08-08 DIAGNOSIS — T148XXA Other injury of unspecified body region, initial encounter: Secondary | ICD-10-CM | POA: Diagnosis not present

## 2020-08-08 DIAGNOSIS — S6721XS Crushing injury of right hand, sequela: Secondary | ICD-10-CM | POA: Diagnosis not present

## 2020-08-12 DIAGNOSIS — F4323 Adjustment disorder with mixed anxiety and depressed mood: Secondary | ICD-10-CM | POA: Diagnosis not present

## 2020-08-16 DIAGNOSIS — F4323 Adjustment disorder with mixed anxiety and depressed mood: Secondary | ICD-10-CM | POA: Diagnosis not present

## 2020-08-18 DIAGNOSIS — F4323 Adjustment disorder with mixed anxiety and depressed mood: Secondary | ICD-10-CM | POA: Diagnosis not present

## 2020-08-22 DIAGNOSIS — F4323 Adjustment disorder with mixed anxiety and depressed mood: Secondary | ICD-10-CM | POA: Diagnosis not present

## 2020-08-23 DIAGNOSIS — F4323 Adjustment disorder with mixed anxiety and depressed mood: Secondary | ICD-10-CM | POA: Diagnosis not present

## 2020-08-25 DIAGNOSIS — F4323 Adjustment disorder with mixed anxiety and depressed mood: Secondary | ICD-10-CM | POA: Diagnosis not present

## 2020-08-29 DIAGNOSIS — F4323 Adjustment disorder with mixed anxiety and depressed mood: Secondary | ICD-10-CM | POA: Diagnosis not present

## 2020-09-23 DIAGNOSIS — Z89021 Acquired absence of right finger(s): Secondary | ICD-10-CM | POA: Diagnosis not present

## 2020-09-23 DIAGNOSIS — Z4781 Encounter for orthopedic aftercare following surgical amputation: Secondary | ICD-10-CM | POA: Diagnosis not present

## 2020-09-23 DIAGNOSIS — F4323 Adjustment disorder with mixed anxiety and depressed mood: Secondary | ICD-10-CM | POA: Diagnosis not present

## 2020-10-06 DIAGNOSIS — F4323 Adjustment disorder with mixed anxiety and depressed mood: Secondary | ICD-10-CM | POA: Diagnosis not present

## 2020-10-22 IMAGING — MR MRI LUMBAR SPINE WITHOUT CONTRAST
4 of 5 series · 15 of 48 positions shown · non-contrast
Comparison: None.

CLINICAL DATA: Initial evaluation for back pain extending into the
legs, with occasional numbness for 1 month.

EXAM:
MRI LUMBAR SPINE WITHOUT CONTRAST
TECHNIQUE: Multiplanar, multisequence MR imaging of the lumbar spine was
performed. No intravenous contrast was administered.

[Series 3: T2 · sagittal · 4.0mm · 0.78mm/px · 6 of 15 slices shown (1 of 2)]
[im 1/15]
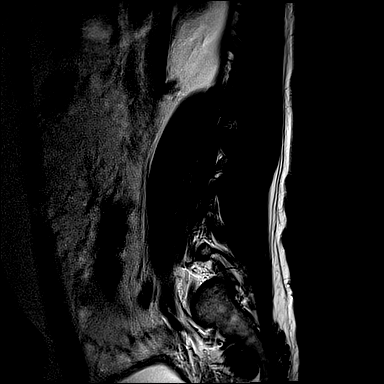
[im 3/15]
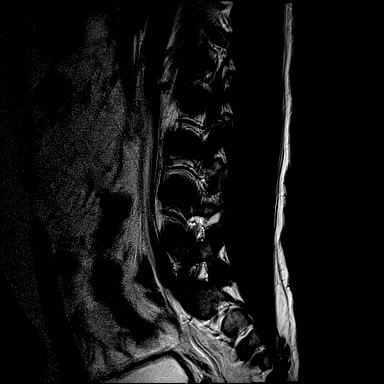
[im 6/15]
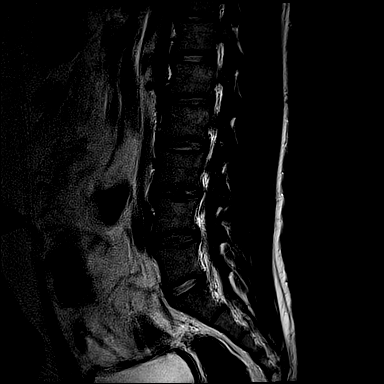
[im 9/15]
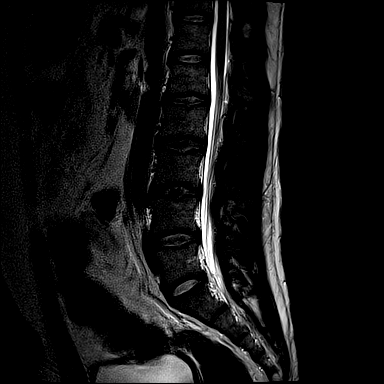
[im 12/15]
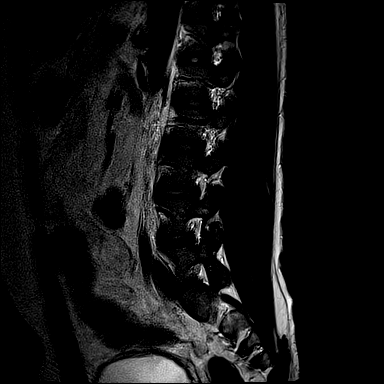
[im 15/15]
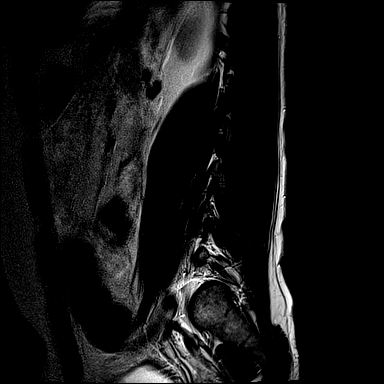

[Series 4: T1 · sagittal · 4.0mm · 0.39mm/px · 3 of 15 slices shown (1 of 2)]
[im 1/15]
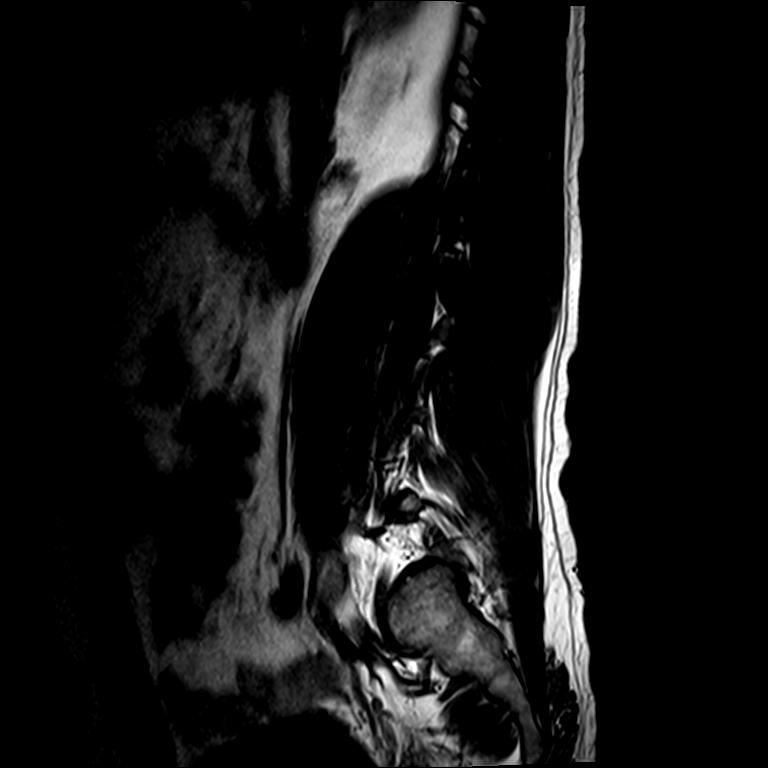
[im 8/15]
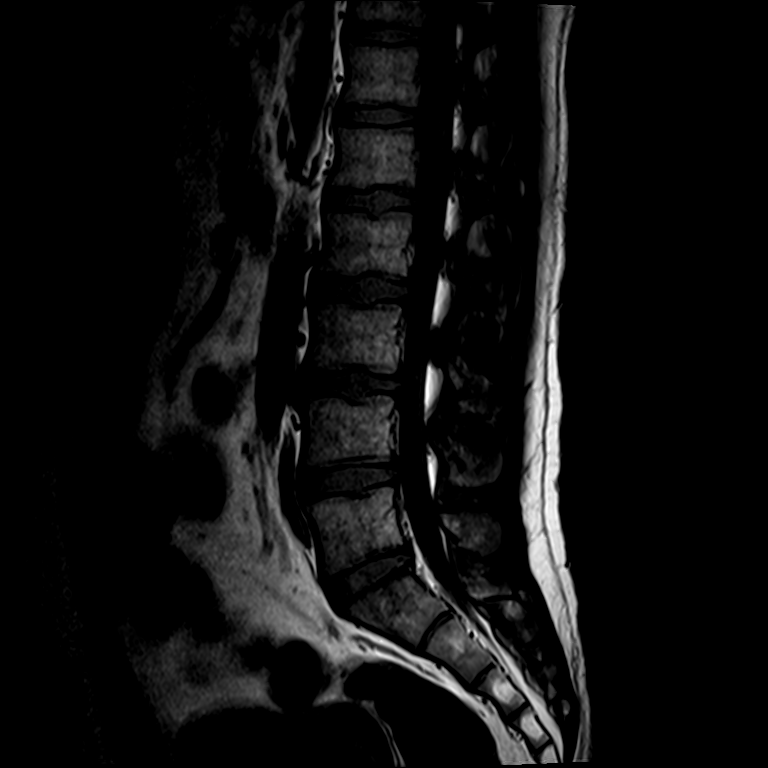
[im 15/15]
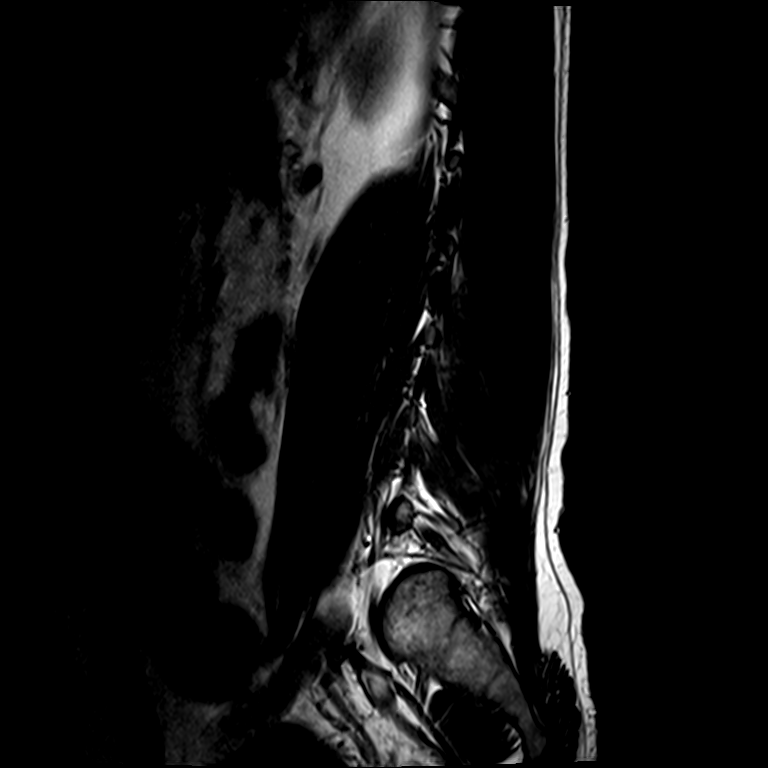

[Series 6: T2 · axial · 4.0mm · 0.27mm/px · z∈[-73,+94]mm · 3 of 48 slices shown (2 of 2)]
[im 7/48]
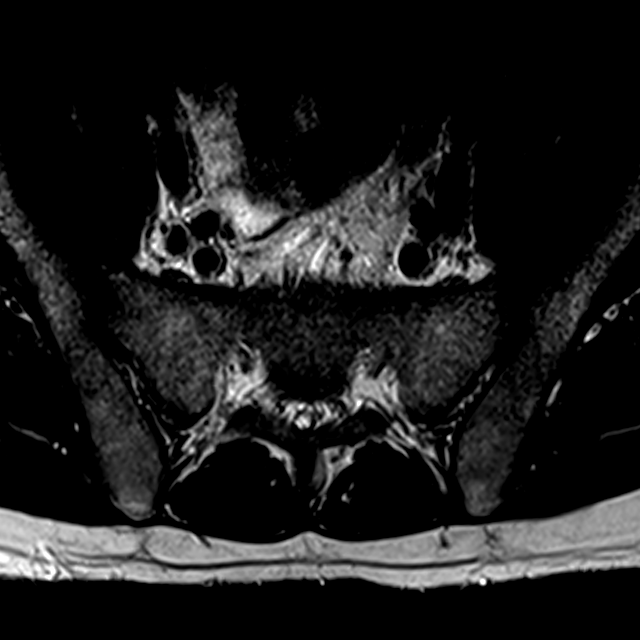
[im 26/48]
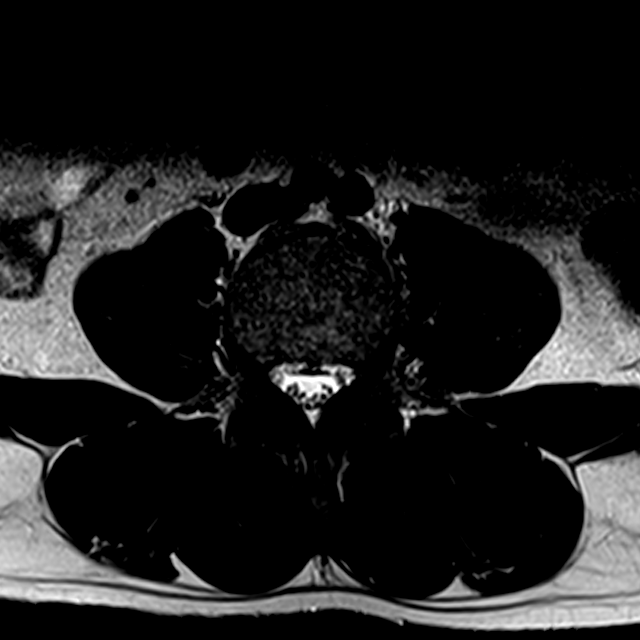
[im 41/48]
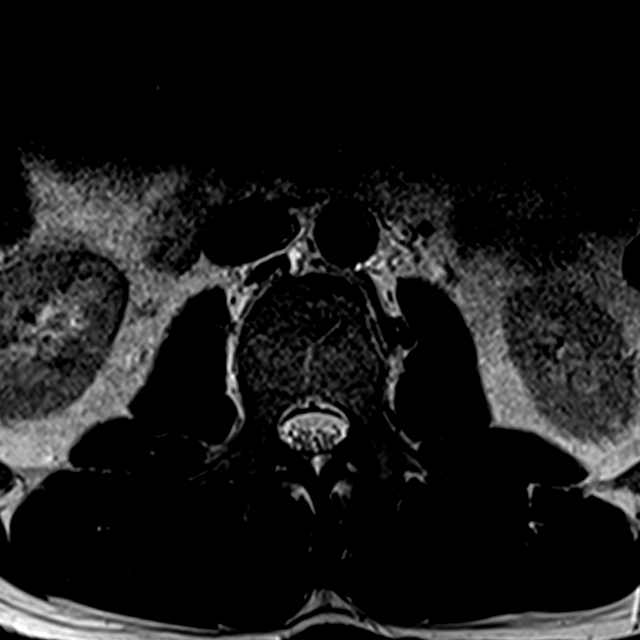

[Series 7: T1 · axial · 4.0mm · 0.27mm/px · z∈[-73,+94]mm · 3 of 48 slices shown (2 of 2)]
[im 7/48]
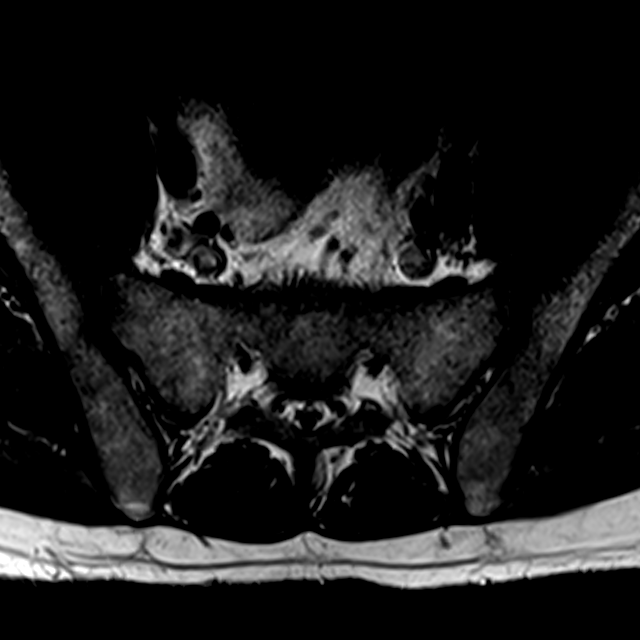
[im 26/48]
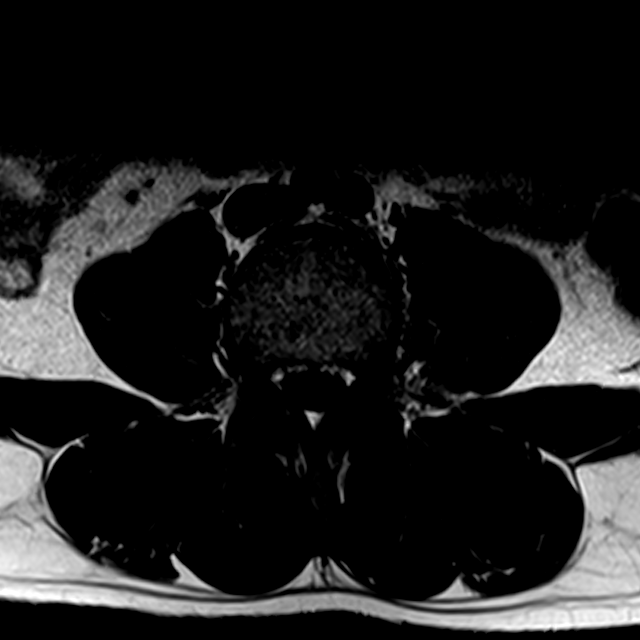
[im 41/48]
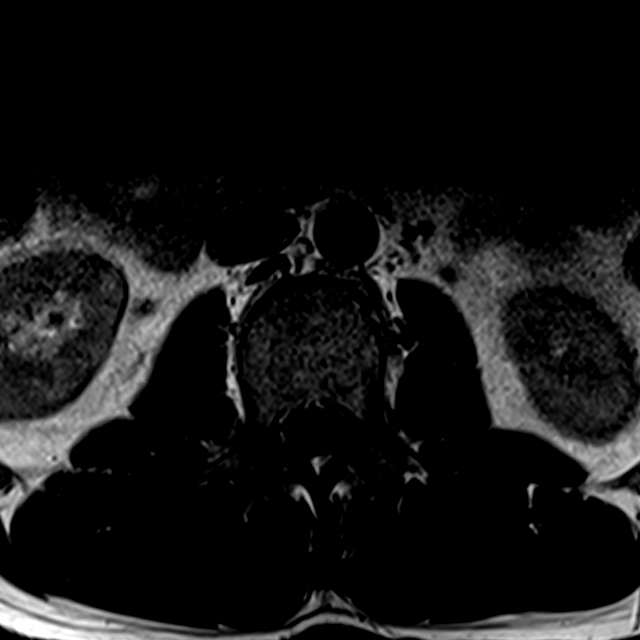

[15 of 48 positions shown; findings below may reference images not displayed]

FINDINGS: Segmentation: Standard. Lowest well-formed disc labeled the L5-S1
level.

Alignment: Physiologic with preservation of the normal lumbar
lordosis. No listhesis.

Vertebrae: Vertebral body height maintained without evidence for
acute or chronic fracture. Few scattered chronic endplate Schmorl's
nodes noted throughout the lumbar spine. Bone marrow signal
intensity within normal limits. No discrete or worrisome osseous
lesions. No abnormal marrow edema.

Conus medullaris and cauda equina: Conus extends to the L1 level.
Conus and cauda equina appear normal.

Paraspinal and other soft tissues: Paraspinous soft tissues within
normal limits. Visualized visceral structures unremarkable.

Disc levels:

L1-2:  Unremarkable.

L2-3: Disc desiccation with minimal annular disc bulge. No stenosis
or impingement.

L3-4: Disc desiccation with mild annular disc bulge. No significant
canal stenosis. Mild bilateral L3 foraminal narrowing.

L4-5:  Unremarkable.

L5-S1:  Unremarkable.
IMPRESSION: 1. Minor degenerative disc bulging at L2-3 and L3-4 without
significant stenosis. Mild bilateral L3 foraminal narrowing without
impingement.
2. Otherwise negative MRI of the lumbar spine.

## 2020-11-11 DIAGNOSIS — E109 Type 1 diabetes mellitus without complications: Secondary | ICD-10-CM | POA: Diagnosis not present

## 2020-11-14 DIAGNOSIS — Z89021 Acquired absence of right finger(s): Secondary | ICD-10-CM | POA: Diagnosis not present

## 2020-11-14 DIAGNOSIS — Z4781 Encounter for orthopedic aftercare following surgical amputation: Secondary | ICD-10-CM | POA: Diagnosis not present

## 2020-11-20 ENCOUNTER — Encounter (HOSPITAL_COMMUNITY): Payer: Self-pay

## 2020-11-20 ENCOUNTER — Emergency Department (HOSPITAL_COMMUNITY): Payer: BC Managed Care – PPO

## 2020-11-20 ENCOUNTER — Other Ambulatory Visit: Payer: Self-pay

## 2020-11-20 ENCOUNTER — Emergency Department (HOSPITAL_COMMUNITY)
Admission: EM | Admit: 2020-11-20 | Discharge: 2020-11-21 | Disposition: A | Discharge status: Home / Self Care | Payer: BC Managed Care – PPO | Attending: Emergency Medicine | Admitting: Emergency Medicine

## 2020-11-20 DIAGNOSIS — J101 Influenza due to other identified influenza virus with other respiratory manifestations: Secondary | ICD-10-CM | POA: Diagnosis not present

## 2020-11-20 DIAGNOSIS — Z20822 Contact with and (suspected) exposure to covid-19: Secondary | ICD-10-CM | POA: Insufficient documentation

## 2020-11-20 DIAGNOSIS — Z794 Long term (current) use of insulin: Secondary | ICD-10-CM | POA: Diagnosis not present

## 2020-11-20 DIAGNOSIS — F17220 Nicotine dependence, chewing tobacco, uncomplicated: Secondary | ICD-10-CM | POA: Insufficient documentation

## 2020-11-20 DIAGNOSIS — R059 Cough, unspecified: Secondary | ICD-10-CM | POA: Diagnosis not present

## 2020-11-20 DIAGNOSIS — I10 Essential (primary) hypertension: Secondary | ICD-10-CM | POA: Insufficient documentation

## 2020-11-20 DIAGNOSIS — Z28310 Unvaccinated for covid-19: Secondary | ICD-10-CM | POA: Insufficient documentation

## 2020-11-20 DIAGNOSIS — E109 Type 1 diabetes mellitus without complications: Secondary | ICD-10-CM | POA: Insufficient documentation

## 2020-11-20 DIAGNOSIS — J111 Influenza due to unidentified influenza virus with other respiratory manifestations: Secondary | ICD-10-CM

## 2020-11-20 DIAGNOSIS — J020 Streptococcal pharyngitis: Secondary | ICD-10-CM | POA: Insufficient documentation

## 2020-11-20 DIAGNOSIS — R519 Headache, unspecified: Secondary | ICD-10-CM | POA: Diagnosis not present

## 2020-11-20 DIAGNOSIS — R000 Tachycardia, unspecified: Secondary | ICD-10-CM | POA: Diagnosis not present

## 2020-11-20 LAB — CBC WITH DIFFERENTIAL/PLATELET
Abs Immature Granulocytes: 0.08 10*3/uL — ABNORMAL HIGH (ref 0.00–0.07)
Basophils Absolute: 0.1 10*3/uL (ref 0.0–0.1)
Basophils Relative: 1 %
Eosinophils Absolute: 0.1 10*3/uL (ref 0.0–0.5)
Eosinophils Relative: 1 %
HCT: 43.4 % (ref 39.0–52.0)
Hemoglobin: 15.2 g/dL (ref 13.0–17.0)
Immature Granulocytes: 1 %
Lymphocytes Relative: 5 %
Lymphs Abs: 0.6 10*3/uL — ABNORMAL LOW (ref 0.7–4.0)
MCH: 29.6 pg (ref 26.0–34.0)
MCHC: 35 g/dL (ref 30.0–36.0)
MCV: 84.6 fL (ref 80.0–100.0)
Monocytes Absolute: 0.7 10*3/uL (ref 0.1–1.0)
Monocytes Relative: 6 %
Neutro Abs: 11.1 10*3/uL — ABNORMAL HIGH (ref 1.7–7.7)
Neutrophils Relative %: 86 %
Platelets: 202 10*3/uL (ref 150–400)
RBC: 5.13 MIL/uL (ref 4.22–5.81)
RDW: 12 % (ref 11.5–15.5)
WBC: 12.7 10*3/uL — ABNORMAL HIGH (ref 4.0–10.5)
nRBC: 0 % (ref 0.0–0.2)

## 2020-11-20 LAB — COMPREHENSIVE METABOLIC PANEL
ALT: 17 U/L (ref 0–44)
AST: 14 U/L — ABNORMAL LOW (ref 15–41)
Albumin: 4.1 g/dL (ref 3.5–5.0)
Alkaline Phosphatase: 94 U/L (ref 38–126)
Anion gap: 7 (ref 5–15)
BUN: 11 mg/dL (ref 6–20)
CO2: 27 mmol/L (ref 22–32)
Calcium: 8.9 mg/dL (ref 8.9–10.3)
Chloride: 100 mmol/L (ref 98–111)
Creatinine, Ser: 1.07 mg/dL (ref 0.61–1.24)
GFR, Estimated: >60 mL/min (ref >60)
Glucose, Bld: 306 mg/dL — ABNORMAL HIGH (ref 70–99)
Potassium: 3.9 mmol/L (ref 3.5–5.1)
Sodium: 134 mmol/L — ABNORMAL LOW (ref 135–145)
Total Bilirubin: 1 mg/dL (ref 0.3–1.2)
Total Protein: 7.3 g/dL (ref 6.5–8.1)

## 2020-11-20 LAB — LACTIC ACID, PLASMA: Lactic Acid, Venous: 1.4 mmol/L (ref 0.5–1.9)

## 2020-11-20 MED ORDER — LACTATED RINGERS IV BOLUS
1000.0000 mL | Freq: Once | INTRAVENOUS | Status: AC
  Administered 2020-11-20: 1000 mL via INTRAVENOUS

## 2020-11-20 MED ORDER — IBUPROFEN 800 MG PO TABS
800.0000 mg | ORAL_TABLET | Freq: Once | ORAL | Status: AC
  Administered 2020-11-20: 800 mg via ORAL
  Filled 2020-11-20: qty 1

## 2020-11-20 MED ORDER — ACETAMINOPHEN 500 MG PO TABS
1000.0000 mg | ORAL_TABLET | Freq: Once | ORAL | Status: AC
  Administered 2020-11-20: 1000 mg via ORAL
  Filled 2020-11-20: qty 2

## 2020-11-20 NOTE — ED Provider Notes (Signed)
[facility] EMERGENCY DEPARTMENT Provider Note   CSN: [id number] Arrival date & time: 11/20/20  2207     History Chief Complaint  Patient presents with  . Influenza    Influenza    Eric [name] is a 35 y.o. male.  Patient with a history of diabetes presenting with a 3-day history of headache, throat pain, congestion, cough, shortness of breath and fever.  Has had sick contacts at home with his family members who have strep throat.  States he thought he could have a sinus infection because he was having facial pain and cough that is nonproductive.  Has a gradual onset diffuse headache, fever up to 102 over the past 2 days.  His chest is sore from coughing but he is unable to bring up anything.  Not short of breath.  No abdominal pain, vomiting or diarrhea.  No pain with urination or blood in the urine.  He is not COVID vaccinated.  He states he has been hyperglycemic at home today.  But he made adjustments to his insulin pump.  States he is not COVID vaccinated.  States his headache is gradual onset and not thunderclap.  There appears to be pressure behind his eyes.  He denies any vomiting or diarrhea  The history is provided by the patient.  Influenza Presenting symptoms: cough, fatigue, fever, headache and rhinorrhea   Presenting symptoms: no nausea and no vomiting   Associated symptoms: nasal congestion       Past Medical History:  Diagnosis Date  . Anxiety   . Depression   . Diabetes mellitus   . Hypertension     Patient Active Problem List   Diagnosis Date Noted  . Psoriasis 06/22/2020  . Wound infection 06/22/2020  . Crushing injury of right hand 06/11/2020  . Degloving injury of right hand 06/11/2020  . Finger amputation, traumatic, initial encounter 06/11/2020  . Dehydration 03/31/2017  . Fever, unspecified 03/31/2017  . Viral gastroenteritis 03/30/2017  . Sinus tachycardia 03/30/2017  . Type 1 diabetes mellitus on insulin therapy (HCC) 02/01/2011  . Low back  pain 02/01/2011  . Depression 02/01/2011  . Elevated blood pressure 02/01/2011    Past Surgical History:  Procedure Laterality Date  . AMPUTATION Right    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  . FRACTURE SURGERY     reconstructive left knee surgery  . KNEE SURGERY         Family History  Problem Relation Age of Onset  . Hypertension Mother   . Hypertension Father     Social History   Tobacco Use  . Smoking status: Never  . Smokeless tobacco: Current    Types: Snuff  Vaping Use  . Vaping Use: Never used  Substance Use Topics  . Alcohol use: Yes    Comment: occ  . Drug use: No    Home Medications Prior to Admission medications   Medication Sig Start Date End Date Taking? Authorizing Provider  albuterol (VENTOLIN HFA) 108 (90 Base) MCG/ACT inhaler Inhale 2 puffs into the lungs every 6 (six) hours as needed for wheezing or shortness of breath. 12/17/19   Maxwell Caul, PA-C  diazepam (VALIUM) 10 MG tablet Take 1 tablet (10 mg total) by mouth every 6 (six) hours as needed. 07/07/20   Dione Booze, MD  escitalopram (LEXAPRO) 20 MG tablet Take 1 tablet by mouth daily. 04/05/20   [provider]  gabapentin (NEURONTIN) 300 MG capsule Take 1 capsule (300 mg total) by mouth 3 (  three) times daily. 07/07/20   Burgess Amor, PA-C  HYDROcodone-acetaminophen (NORCO/VICODIN) 5-325 MG tablet Take 1 tablet by mouth every 4 (four) hours as needed for moderate pain.  11/28/18   [provider]  ibuprofen (ADVIL) 600 MG tablet Take 1 tablet (600 mg total) by mouth every 6 (six) hours as needed. 11/29/18   Idol, Raynelle Fanning, PA-C  insulin lispro (HUMALOG) 100 UNIT/ML injection Inject 1-10 Units into the skin 3 (three) times daily before meals. Patient taking differently: 1-10 Units 3 (three) times daily with meals. Based on sliding scale/carb intake-via pump 02/03/11   Elsaid, Hind I, MD  methocarbamol (ROBAXIN-750) 750 MG tablet Take 1 tablet (750 mg total) by mouth every 8 (eight) hours  as needed for muscle spasms. 11/29/18   Burgess Amor, PA-C  oxyCODONE-acetaminophen (PERCOCET) 5-325 MG tablet Take 1-2 tablets by mouth every 6 (six) hours as needed. 06/19/20   Geoffery Lyons, MD    Allergies    Patient has no known allergies.  Review of Systems   Review of Systems  Constitutional:  Positive for activity change, appetite change, fatigue and fever.  HENT:  Positive for congestion and rhinorrhea.   Respiratory:  Positive for cough.   Cardiovascular:  Negative for chest pain.  Gastrointestinal:  Negative for abdominal pain, nausea and vomiting.  Genitourinary:  Negative for dysuria, frequency and hematuria.  Musculoskeletal:  Positive for arthralgias.  Skin:  Negative for rash.  Neurological:  Positive for headaches. Negative for dizziness and weakness.   all other systems are negative except as noted in the HPI and PMH.   Physical Exam Updated Vital Signs BP (!) 148/86   Pulse (!) 116   Temp (!) 102.5 F (39.2 C) (Oral)   Resp 15   Wt 73.5 kg   SpO2 97%   BMI 23.92 kg/m   Physical Exam Vitals and nursing note reviewed.  Constitutional:      General: He is not in acute distress.    Appearance: He is well-developed. He is not ill-appearing.  HENT:     Head: Normocephalic and atraumatic.     Right Ear: Tympanic membrane normal.     Left Ear: Tympanic membrane normal.     Mouth/Throat:     Mouth: Mucous membranes are moist.     Pharynx: Posterior oropharyngeal erythema present. No oropharyngeal exudate.     Comments: Erythematous oropharynx without exudate.  Uvula is midline.  No asymmetry Eyes:     Conjunctiva/sclera: Conjunctivae normal.     Pupils: Pupils are equal, round, and reactive to light.  Neck:     Comments: No meningismus. Cardiovascular:     Rate and Rhythm: Regular rhythm. Tachycardia present.     Heart sounds: Normal heart sounds. No murmur heard. Pulmonary:     Effort: Pulmonary effort is normal. No respiratory distress.     Breath  sounds: Normal breath sounds.  Chest:     Chest wall: No tenderness.  Abdominal:     Palpations: Abdomen is soft.     Tenderness: There is abdominal tenderness. There is no guarding or rebound.     Comments: Mild diffuse tenderness  Musculoskeletal:        General: No tenderness. Normal range of motion.     Cervical back: Normal range of motion and neck supple.     Right lower leg: No edema.     Left lower leg: No edema.  Skin:    General: Skin is warm.     Findings: No rash.  Neurological:     General: No focal deficit present.     Mental Status: He is alert and oriented to person, place, and time. Mental status is at baseline.     Cranial Nerves: No cranial nerve deficit.     Motor: No abnormal muscle tone.     Coordination: Coordination normal.     Comments: No ataxia on finger to nose bilaterally. No pronator drift. 5/5 strength throughout. CN 2-12 intact.Equal grip strength. Sensation intact.   Psychiatric:        Behavior: Behavior normal.    ED Results / Procedures / Treatments   Labs (all labs ordered are listed, but only abnormal results are displayed) Labs Reviewed  GROUP A STREP BY PCR - Abnormal; Notable for the following components:      Result Value   Group A Strep by PCR DETECTED (*)    All other components within normal limits  COMPREHENSIVE METABOLIC PANEL - Abnormal; Notable for the following components:   Sodium 134 (*)    Glucose, Bld 306 (*)    AST 14 (*)    All other components within normal limits  CBC WITH DIFFERENTIAL/PLATELET - Abnormal; Notable for the following components:   WBC 12.7 (*)    Neutro Abs 11.1 (*)    Lymphs Abs 0.6 (*)    Abs Immature Granulocytes 0.08 (*)    All other components within normal limits  CULTURE, BLOOD (ROUTINE X 2)  CULTURE, BLOOD (ROUTINE X 2)  RESP PANEL BY RT-PCR (FLU A&B, COVID) ARPGX2  LACTIC ACID, PLASMA  LACTIC ACID, PLASMA    EKG EKG Interpretation  Date/Time:  Sunday November 20 2020 22:36:17  EDT Ventricular Rate:  122 PR Interval:  153 QRS Duration: 71 QT Interval:  284 QTC Calculation: 405 R Axis:   50 Text Interpretation: Sinus tachycardia Rate faster Confirmed by Glynn Octave [phone]) on 11/20/2020 10:56:53 PM  Radiology DG Chest Portable 1 View  Result Date: 11/20/2020 CLINICAL DATA:  Cough EXAM: PORTABLE CHEST 1 VIEW COMPARISON:  02/29/2020 FINDINGS: Heart and mediastinal contours are within normal limits. No focal opacities or effusions. No acute bony abnormality. IMPRESSION: No active disease. Electronically Signed   By: Charlett Nose M.D.   On: 11/20/2020 23:25    Procedures Procedures   Medications Ordered in ED Medications  acetaminophen (TYLENOL) tablet 1,000 mg (has no administration in time range)  ibuprofen (ADVIL) tablet 800 mg (has no administration in time range)  lactated ringers bolus 1,000 mL (has no administration in time range)    ED Course  I have reviewed the triage vital signs and the nursing notes.  Pertinent labs & imaging results that were available during my care of the patient were reviewed by me and considered in my medical decision making (see chart for details).    MDM Rules/Calculators/A&P                          3 days of cough, headache, sore throat, fever, body aches and chills.  Tachycardic and febrile on arrival. No meningismus. Neurologically intact.  Abdomen is soft without peritoneal signs.  Will evaluate for flu as well as COVID.  Patient given IV fluids Labs and cultures obtained.  Rapid strep is positive.  Chest x-ray is negative for infiltrate.  Hyperglycemia without evidence of DKA.  Heart rate improving with IV fluids  COVID and flu are negative.  Heart rate has normalized to the 90s.  Patient agreeable to  Bicillin injection for strep infection.  Low suspicion for peritonsillar abscess or retropharyngeal abscess.  Patient tolerating p.o. and ambulatory. Discussed oral hydration at home and antipyretics.   Discussed possibility of COVID despite negative test today and should keep her self quarantine while he is feeling ill. Monitor blood sugars closely. Return to the ED with new or worsening symptoms. Final Clinical Impression(s) / ED Diagnoses Final diagnoses:  Influenza-like illness  Strep pharyngitis    Rx / DC Orders ED Discharge Orders     None        Majesti Gambrell, Jeannett Senior, MD 11/21/20 [phone]

## 2020-11-20 NOTE — ED Triage Notes (Addendum)
Pt complains of headache since thursday.Throat has been hurting and he states he has been coughing alot. Last temp took at home was 99.5

## 2020-11-21 LAB — RESP PANEL BY RT-PCR (FLU A&B, COVID) ARPGX2
Influenza A by PCR: NEGATIVE
Influenza B by PCR: NEGATIVE
SARS Coronavirus 2 by RT PCR: NEGATIVE

## 2020-11-21 LAB — LACTIC ACID, PLASMA: Lactic Acid, Venous: 0.8 mmol/L (ref 0.5–1.9)

## 2020-11-21 LAB — GROUP A STREP BY PCR: Group A Strep by PCR: DETECTED — AB

## 2020-11-21 MED ORDER — LACTATED RINGERS IV BOLUS
1000.0000 mL | Freq: Once | INTRAVENOUS | Status: AC
  Administered 2020-11-21: 1000 mL via INTRAVENOUS

## 2020-11-21 MED ORDER — KETOROLAC TROMETHAMINE 30 MG/ML IJ SOLN
30.0000 mg | Freq: Once | INTRAMUSCULAR | Status: AC
  Administered 2020-11-21: 30 mg via INTRAVENOUS
  Filled 2020-11-21: qty 1

## 2020-11-21 MED ORDER — PENICILLIN G BENZATHINE 1200000 UNIT/2ML IM SUSY
1.2000 10*6.[IU] | PREFILLED_SYRINGE | Freq: Once | INTRAMUSCULAR | Status: AC
  Administered 2020-11-21: 1.2 10*6.[IU] via INTRAMUSCULAR
  Filled 2020-11-21: qty 2

## 2020-11-21 NOTE — ED Notes (Signed)
Pt able to walk without difficulty .  

## 2020-11-21 NOTE — Discharge Instructions (Signed)
You are treated with penicillin today for your strep throat.  Flu and COVID tests are negative today but as we discussed COVID is still a possibility and should keep yourself quarantined while you are feeling ill.  Monitor your blood sugars closely.  Use Tylenol or Motrin as needed for aches and fever.  Follow-up with your doctor.  Return to the ED with new or worsening symptoms

## 2020-11-22 DIAGNOSIS — J020 Streptococcal pharyngitis: Secondary | ICD-10-CM | POA: Diagnosis not present

## 2020-11-25 LAB — CULTURE, BLOOD (ROUTINE X 2)
Culture: NO GROWTH
Culture: NO GROWTH
Special Requests: ADEQUATE
Special Requests: ADEQUATE

## 2020-12-02 DIAGNOSIS — Z89021 Acquired absence of right finger(s): Secondary | ICD-10-CM | POA: Diagnosis not present

## 2020-12-21 ENCOUNTER — Emergency Department (HOSPITAL_COMMUNITY): Payer: BC Managed Care – PPO

## 2020-12-21 ENCOUNTER — Encounter (HOSPITAL_COMMUNITY): Payer: Self-pay | Admitting: *Deleted

## 2020-12-21 ENCOUNTER — Other Ambulatory Visit: Payer: Self-pay

## 2020-12-21 ENCOUNTER — Emergency Department (HOSPITAL_COMMUNITY)
Admission: EM | Admit: 2020-12-21 | Discharge: 2020-12-22 | Disposition: A | Discharge status: Home / Self Care | Payer: BC Managed Care – PPO | Attending: Emergency Medicine | Admitting: Emergency Medicine

## 2020-12-21 DIAGNOSIS — E109 Type 1 diabetes mellitus without complications: Secondary | ICD-10-CM | POA: Insufficient documentation

## 2020-12-21 DIAGNOSIS — W109XXA Fall (on) (from) unspecified stairs and steps, initial encounter: Secondary | ICD-10-CM | POA: Diagnosis not present

## 2020-12-21 DIAGNOSIS — S199XXA Unspecified injury of neck, initial encounter: Secondary | ICD-10-CM | POA: Insufficient documentation

## 2020-12-21 DIAGNOSIS — S3991XA Unspecified injury of abdomen, initial encounter: Secondary | ICD-10-CM | POA: Insufficient documentation

## 2020-12-21 DIAGNOSIS — F17220 Nicotine dependence, chewing tobacco, uncomplicated: Secondary | ICD-10-CM | POA: Insufficient documentation

## 2020-12-21 DIAGNOSIS — R109 Unspecified abdominal pain: Secondary | ICD-10-CM | POA: Diagnosis not present

## 2020-12-21 DIAGNOSIS — I10 Essential (primary) hypertension: Secondary | ICD-10-CM | POA: Diagnosis not present

## 2020-12-21 DIAGNOSIS — S298XXA Other specified injuries of thorax, initial encounter: Secondary | ICD-10-CM

## 2020-12-21 DIAGNOSIS — S299XXA Unspecified injury of thorax, initial encounter: Secondary | ICD-10-CM | POA: Diagnosis not present

## 2020-12-21 DIAGNOSIS — Z794 Long term (current) use of insulin: Secondary | ICD-10-CM | POA: Diagnosis not present

## 2020-12-21 MED ORDER — FENTANYL CITRATE PF 50 MCG/ML IJ SOSY
100.0000 ug | PREFILLED_SYRINGE | Freq: Once | INTRAMUSCULAR | Status: AC
  Administered 2020-12-22: 100 ug via INTRAVENOUS
  Filled 2020-12-21: qty 2

## 2020-12-21 MED ORDER — SODIUM CHLORIDE 0.9 % IV BOLUS
1000.0000 mL | Freq: Once | INTRAVENOUS | Status: AC
  Administered 2020-12-22: 1000 mL via INTRAVENOUS

## 2020-12-21 NOTE — ED Triage Notes (Signed)
Pt with left sided abd pain.   Slipped and fell on landed on left the other night. +nausea

## 2020-12-22 ENCOUNTER — Emergency Department (HOSPITAL_COMMUNITY): Payer: BC Managed Care – PPO

## 2020-12-22 DIAGNOSIS — R109 Unspecified abdominal pain: Secondary | ICD-10-CM | POA: Diagnosis not present

## 2020-12-22 LAB — CBG MONITORING, ED
Glucose-Capillary: 43 mg/dL — CL (ref 70–99)
Glucose-Capillary: 175 mg/dL — ABNORMAL HIGH (ref 70–99)

## 2020-12-22 LAB — COMPREHENSIVE METABOLIC PANEL
ALT: 23 U/L (ref 0–44)
AST: 19 U/L (ref 15–41)
Albumin: 4.5 g/dL (ref 3.5–5.0)
Alkaline Phosphatase: 69 U/L (ref 38–126)
Anion gap: 11 (ref 5–15)
BUN: 8 mg/dL (ref 6–20)
CO2: 27 mmol/L (ref 22–32)
Calcium: 9.5 mg/dL (ref 8.9–10.3)
Chloride: 102 mmol/L (ref 98–111)
Creatinine, Ser: 0.91 mg/dL (ref 0.61–1.24)
GFR, Estimated: >60 mL/min (ref >60)
Glucose, Bld: 67 mg/dL — ABNORMAL LOW (ref 70–99)
Potassium: 3.8 mmol/L (ref 3.5–5.1)
Sodium: 140 mmol/L (ref 135–145)
Total Bilirubin: 1 mg/dL (ref 0.3–1.2)
Total Protein: 7.2 g/dL (ref 6.5–8.1)

## 2020-12-22 LAB — CBC
HCT: 45.5 % (ref 39.0–52.0)
Hemoglobin: 16 g/dL (ref 13.0–17.0)
MCH: 29.5 pg (ref 26.0–34.0)
MCHC: 35.2 g/dL (ref 30.0–36.0)
MCV: 83.9 fL (ref 80.0–100.0)
Platelets: 171 10*3/uL (ref 150–400)
RBC: 5.42 MIL/uL (ref 4.22–5.81)
RDW: 11.9 % (ref 11.5–15.5)
WBC: 6.3 10*3/uL (ref 4.0–10.5)
nRBC: 0 % (ref 0.0–0.2)

## 2020-12-22 LAB — TYPE AND SCREEN
ABO/RH(D): O POS
Antibody Screen: NEGATIVE

## 2020-12-22 MED ORDER — IOHEXOL 300 MG/ML  SOLN
100.0000 mL | Freq: Once | INTRAMUSCULAR | Status: AC | PRN
  Administered 2020-12-22: 100 mL via INTRAVENOUS

## 2020-12-22 NOTE — ED Provider Notes (Signed)
[facility] EMERGENCY DEPARTMENT Provider Note   CSN: [id number] Arrival date & time: 12/21/20  1926     History Chief Complaint  Patient presents with   Abdominal Pain    Eric [name] is a 35 y.o. male.  The history is provided by the patient.  Abdominal Pain Pain location:  LUQ and L flank Pain quality: aching   Pain severity:  Severe Onset quality:  Sudden Duration:  2 days Timing:  Constant Progression:  Worsening Chronicity:  New Context: trauma   Relieved by:  Nothing Worsened by:  Movement, palpation, coughing and deep breathing Associated symptoms: no fever and no hematuria   Patient presents with left chest wall and left upper quadrant abdominal pain after a recent fall.  Patient reports he slipped on stairs and landed directly on the left side.  Denies any head injury or LOC.  He reports it hurts to breathe.  No fevers or vomiting.  He does report nausea.     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   Dehydration 03/31/2017   Fever, unspecified 03/31/2017   Viral gastroenteritis 03/30/2017   Sinus tachycardia 03/30/2017   Type 1 diabetes mellitus on insulin therapy ([place]) 02/01/2011   Low back pain 02/01/2011   Depression 02/01/2011   Elevated blood pressure 02/01/2011    Past Surgical History:  Procedure Laterality Date   AMPUTATION Right    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   FRACTURE SURGERY     reconstructive left knee surgery   KNEE SURGERY         Family History  Problem Relation Age of Onset   Hypertension Mother    Hypertension Father     Social History   Tobacco Use   Smoking status: Never   Smokeless tobacco:  Current    Types: Snuff  Vaping Use   Vaping Use: Never used  Substance Use Topics   Alcohol use: Yes    Comment: occ   Drug use: No    Home Medications Prior to Admission medications   Medication Sig Start Date End Date Taking? Authorizing Provider  albuterol (VENTOLIN HFA) 108 (90 Base) MCG/ACT inhaler Inhale 2 puffs into the lungs every 6 (six) hours as needed for wheezing or shortness of breath. 12/17/19   Volanda Napoleon, PA-C  diazepam (VALIUM) 10 MG tablet Take 1 tablet (10 mg total) by mouth every 6 (six) hours as needed. [id number]   Delora Fuel, MD  escitalopram (LEXAPRO) 20 MG tablet Take 1 tablet by mouth daily. 04/05/20   [provider]  gabapentin (NEURONTIN) 300 MG capsule Take 1 capsule (300 mg total) by mouth 3 (three) times daily. 07/07/20   Evalee Jefferson, PA-C  HYDROcodone-acetaminophen (NORCO/VICODIN) 5-325 MG tablet Take 1 tablet by mouth every 4 (four) hours as needed for moderate pain.  11/28/18   [provider]  ibuprofen (ADVIL) 600 MG tablet Take 1 tablet (600 mg total) by mouth every 6 (six) hours as needed. 11/29/18   Idol, Almyra Free, PA-C  insulin lispro (HUMALOG) 100 UNIT/ML injection Inject 1-10 Units into the skin 3 (three) times daily before meals. Patient taking differently:  1-10 Units 3 (three) times daily with meals. Based on sliding scale/carb intake-via pump 02/03/11   Elsaid, Hind I, MD  methocarbamol (ROBAXIN-750) 750 MG tablet Take 1 tablet (750 mg total) by mouth every 8 (eight) hours as needed for muscle spasms. 11/29/18   Evalee Jefferson, PA-C  oxyCODONE-acetaminophen (PERCOCET) 5-325 MG tablet Take 1-2 tablets by mouth every 6 (six) hours as needed. 06/19/20   Veryl Speak, MD    Allergies    Patient has no known allergies.  Review of Systems   Review of Systems  Constitutional:  Negative for fever.  Cardiovascular:        Chest wall pain  Gastrointestinal:  Positive for abdominal pain.  Genitourinary:  Negative for hematuria.   Musculoskeletal:  Negative for neck pain.  Skin:        Bruising  All other systems reviewed and are negative.  Physical Exam Updated Vital Signs BP 134/80   Pulse 93   Temp 98.9 F (37.2 C)   Resp 17   Ht 1.753 m (5\' 9" )   Wt 68.9 kg   SpO2 99%   BMI 22.45 kg/m   Physical Exam CONSTITUTIONAL: Well developed/well nourished, uncomfortable appearing HEAD: Normocephalic/atraumatic EYES: EOMI/PERRL ENMT: Mucous membranes moist NECK: supple no meningeal signs SPINE/BACK:entire spine nontender, insert spine CV: S1/S2 noted, no murmurs/rubs/gallops noted LUNGS: Lungs are clear to auscultation bilaterally, no apparent distress Chest-diffuse left-sided chest wall tenderness without crepitus.  There is bruising noted. ABDOMEN: soft, mild LUQ tenderness, no rebound or guarding, bowel sounds noted throughout abdomen GU: Left flank Bruising and tenderness NEURO: Pt is awake/alert/appropriate, moves all extremitiesx4.  No facial droop.   EXTREMITIES: pulses normal/equal, full ROM,All other extremities/joints palpated/ranged and nontender Previous right fingers amputation noted SKIN: warm, color normal PSYCH: Anxious  ED Results / Procedures / Treatments   Labs (all labs ordered are listed, but only abnormal results are displayed) Labs Reviewed  COMPREHENSIVE METABOLIC PANEL - Abnormal; Notable for the following components:      Result Value   Glucose, Bld 67 (*)    All other components within normal limits  CBG MONITORING, ED - Abnormal; Notable for the following components:   Glucose-Capillary 43 (*)    All other components within normal limits  CBG MONITORING, ED - Abnormal; Notable for the following components:   Glucose-Capillary 175 (*)    All other components within normal limits  CBC  TYPE AND SCREEN    EKG None  Radiology CT ABDOMEN PELVIS W CONTRAST  Result Date: 12/22/2020 CLINICAL DATA:  Left abdominal pain, fall EXAM: CT ABDOMEN AND PELVIS WITH CONTRAST  TECHNIQUE: Multidetector CT imaging of the abdomen and pelvis was performed using the standard protocol following bolus administration of intravenous contrast. CONTRAST:  135mL OMNIPAQUE IOHEXOL 300 MG/ML  SOLN COMPARISON:  11/29/2018 FINDINGS: Lower chest: Lung bases are clear. Hepatobiliary: Liver is within normal limits. Gallbladder is unremarkable. No intrahepatic or extrahepatic ductal dilatation. Pancreas: Within normal limits. Spleen: Within normal limits. Adrenals/Urinary Tract: Adrenal glands are within normal limits. Kidneys are within normal limits.  No hydronephrosis. Bladder is within normal limits. Stomach/Bowel: Stomach is within normal limits. No evidence of bowel obstruction. Normal appendix (series 2/image 43). No colonic wall thickening or inflammatory changes. Vascular/Lymphatic: No evidence of abdominal aortic aneurysm. No suspicious abdominopelvic lymphadenopathy. Reproductive: Prostate is unremarkable. Other: No abdominopelvic ascites. Musculoskeletal: Visualized osseous structures are within normal limits. Specifically, the visualized left ribs are intact. IMPRESSION: Negative CT abdomen/pelvis. Electronically Signed   By: Bertis Ruddy  Rito Ehrlich M.D.   On: 12/22/2020 01:17   DG Chest Port 1 View  Result Date: 12/22/2020 CLINICAL DATA:  Left-sided abdominal pain EXAM: PORTABLE CHEST 1 VIEW COMPARISON:  11/20/2020 FINDINGS: Lungs are clear.  No pleural effusion or pneumothorax. The heart is normal in size. IMPRESSION: No evidence of acute cardiopulmonary disease. Electronically Signed   By: Charline Bills M.D.   On: 12/22/2020 00:11    Procedures Procedures   Medications Ordered in ED Medications  sodium chloride 0.9 % bolus 1,000 mL (0 mLs Intravenous Stopped 12/22/20 0253)  fentaNYL (SUBLIMAZE) injection 100 mcg (100 mcg Intravenous Given 12/22/20 0033)  iohexol (OMNIPAQUE) 300 MG/ML solution 100 mL (100 mLs Intravenous Contrast Given 12/22/20 0051)    ED Course  I have  reviewed the triage vital signs and the nursing notes.  Pertinent labs & imaging results that were available during my care of the patient were reviewed by me and considered in my medical decision making (see chart for details).    MDM Rules/Calculators/A&P                           3:20 AM Patient presents after accidental fall downstairs. Patient has significant tenderness over the left flank and chest wall.  Imaging has been ordered 3:20 AM No signs of acute traumatic injury to chest or abdomen.  Vitals are appropriate. Patient did have hypoglycemia events because he had his insulin pump on and was not taking p.o. He is now taking oral fluids and crackers without difficulty.  Glucose is improving. No other signs of injury, he is safe for discharge Final Clinical Impression(s) / ED Diagnoses Final diagnoses:  Blunt trauma to abdomen, initial encounter  Blunt trauma to chest, initial encounter    Rx / DC Orders ED Discharge Orders     None        Zadie Rhine, MD 12/22/20 0321

## 2020-12-22 NOTE — ED Notes (Signed)
Pt alert and oriented x 4. Pt given gingerale and pack of nabs due to hypoglycemia. Dr Bebe Shaggy made aware. Pt eating and drinking at this time.

## 2020-12-22 NOTE — ED Notes (Signed)
Patient transported to CT 

## 2021-01-03 ENCOUNTER — Other Ambulatory Visit: Payer: Self-pay

## 2021-01-03 ENCOUNTER — Emergency Department (HOSPITAL_COMMUNITY)
Admission: EM | Admit: 2021-01-03 | Discharge: 2021-01-03 | Disposition: A | Discharge status: Home / Self Care | Payer: BC Managed Care – PPO | Attending: Emergency Medicine | Admitting: Emergency Medicine

## 2021-01-03 ENCOUNTER — Encounter (HOSPITAL_COMMUNITY): Payer: Self-pay | Admitting: *Deleted

## 2021-01-03 ENCOUNTER — Emergency Department (HOSPITAL_COMMUNITY): Payer: BC Managed Care – PPO

## 2021-01-03 DIAGNOSIS — R000 Tachycardia, unspecified: Secondary | ICD-10-CM | POA: Diagnosis not present

## 2021-01-03 DIAGNOSIS — Z794 Long term (current) use of insulin: Secondary | ICD-10-CM | POA: Insufficient documentation

## 2021-01-03 DIAGNOSIS — R0781 Pleurodynia: Secondary | ICD-10-CM | POA: Diagnosis not present

## 2021-01-03 DIAGNOSIS — E109 Type 1 diabetes mellitus without complications: Secondary | ICD-10-CM | POA: Insufficient documentation

## 2021-01-03 DIAGNOSIS — F17200 Nicotine dependence, unspecified, uncomplicated: Secondary | ICD-10-CM | POA: Insufficient documentation

## 2021-01-03 DIAGNOSIS — S299XXA Unspecified injury of thorax, initial encounter: Secondary | ICD-10-CM | POA: Diagnosis not present

## 2021-01-03 DIAGNOSIS — I10 Essential (primary) hypertension: Secondary | ICD-10-CM | POA: Diagnosis not present

## 2021-01-03 LAB — BASIC METABOLIC PANEL
Anion gap: 7 (ref 5–15)
BUN: 18 mg/dL (ref 6–20)
CO2: 28 mmol/L (ref 22–32)
Calcium: 9.5 mg/dL (ref 8.9–10.3)
Chloride: 98 mmol/L (ref 98–111)
Creatinine, Ser: 1.07 mg/dL (ref 0.61–1.24)
GFR, Estimated: >60 mL/min (ref >60)
Glucose, Bld: 276 mg/dL — ABNORMAL HIGH (ref 70–99)
Potassium: 4.5 mmol/L (ref 3.5–5.1)
Sodium: 133 mmol/L — ABNORMAL LOW (ref 135–145)

## 2021-01-03 LAB — CBC WITH DIFFERENTIAL/PLATELET
Abs Immature Granulocytes: 0.01 10*3/uL (ref 0.00–0.07)
Basophils Absolute: 0.1 10*3/uL (ref 0.0–0.1)
Basophils Relative: 1 %
Eosinophils Absolute: 0.1 10*3/uL (ref 0.0–0.5)
Eosinophils Relative: 2 %
HCT: 46.2 % (ref 39.0–52.0)
Hemoglobin: 16.1 g/dL (ref 13.0–17.0)
Immature Granulocytes: 0 %
Lymphocytes Relative: 17 %
Lymphs Abs: 1.1 10*3/uL (ref 0.7–4.0)
MCH: 30 pg (ref 26.0–34.0)
MCHC: 34.8 g/dL (ref 30.0–36.0)
MCV: 86 fL (ref 80.0–100.0)
Monocytes Absolute: 0.3 10*3/uL (ref 0.1–1.0)
Monocytes Relative: 5 %
Neutro Abs: 4.8 10*3/uL (ref 1.7–7.7)
Neutrophils Relative %: 75 %
Platelets: 254 10*3/uL (ref 150–400)
RBC: 5.37 MIL/uL (ref 4.22–5.81)
RDW: 11.7 % (ref 11.5–15.5)
WBC: 6.4 10*3/uL (ref 4.0–10.5)
nRBC: 0 % (ref 0.0–0.2)

## 2021-01-03 LAB — D-DIMER, QUANTITATIVE: D-Dimer, Quant: <0.27 ug/mL-FEU (ref 0.00–0.50)

## 2021-01-03 MED ORDER — OXYCODONE-ACETAMINOPHEN 5-325 MG PO TABS: 1.0000 | ORAL_TABLET | Freq: Three times a day (TID) | ORAL | 0 refills | Status: AC | PRN

## 2021-01-03 MED ORDER — CYCLOBENZAPRINE HCL 10 MG PO TABS: 10.0000 mg | ORAL_TABLET | Freq: Two times a day (BID) | ORAL | 0 refills | Status: DC | PRN

## 2021-01-03 NOTE — Discharge Instructions (Signed)
Lab work and imaging all reassuring.  Likely this is more or less a muscle strain. I have given you a short course of narcotics please take as prescribed.  This medication can make you drowsy do not consume alcohol or operate heavy machinery when taking this medication.  This medication is Tylenol in it do not take Tylenol and take this medication.  I have also given you a prescription for a muscle relaxer this can make you drowsy do not consume alcohol or operate heavy machinery when taking this medication.   Please follow PCP for further evaluation.  Come back to the emergency department if you develop chest pain, shortness of breath, severe abdominal pain, uncontrolled nausea, vomiting, diarrhea.

## 2021-01-03 NOTE — ED Provider Notes (Signed)
[facility] EMERGENCY DEPARTMENT Provider Note   CSN: [id number] Arrival date & time: 01/03/21  1929     History Chief Complaint  Patient presents with   Chest Pain    Eric [name] is a 35 y.o. male.  HPI  Patient with no significant medical history presents with chief complaint of left rib pain.  Patient states pain started 2 days ago, started after he had a large sneeze, states the pain remains in his left side, does not radiate, describes it as a sharp-like sensation, worsened when he takes in an deep breath or when he moves that area, he denies  chest pain, shortness of breath, feeling lightheaded dizziness, denies productive cough, hemoptysis, dyspnea on exertion.  He has no significant cardiac history, no history of PEs or DVTs, currently not on hormone therapy, denies illicit drug use.  No history of pneumothoraxes.  He denies alleviating or aggravating factors, never had this in the past.  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   Dehydration 03/31/2017   Fever, unspecified 03/31/2017   Viral gastroenteritis 03/30/2017   Sinus tachycardia 03/30/2017   Type 1 diabetes mellitus on insulin therapy ([place]) 02/01/2011   Low back pain 02/01/2011   Depression 02/01/2011   Elevated blood pressure 02/01/2011    Past Surgical History:  Procedure Laterality Date   AMPUTATION Right    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   FRACTURE SURGERY     reconstructive left knee surgery   KNEE SURGERY         Family History  Problem Relation Age of Onset   Hypertension Mother    Hypertension Father     Social History   Tobacco Use   Smoking status: Never    Smokeless tobacco: Current    Types: Snuff  Vaping Use   Vaping Use: Never used  Substance Use Topics   Alcohol use: Yes    Comment: occ   Drug use: No    Home Medications Prior to Admission medications   Medication Sig Start Date End Date Taking? Authorizing Provider  cyclobenzaprine (FLEXERIL) 10 MG tablet Take 1 tablet (10 mg total) by mouth 2 (two) times daily as needed for muscle spasms. 01/03/21  Yes Marcello Fennel, PA-C  oxyCODONE-acetaminophen (PERCOCET/ROXICET) 5-325 MG tablet Take 1 tablet by mouth every 8 (eight) hours as needed for up to 2 days for severe pain. 01/03/21 01/05/21 Yes Marcello Fennel, PA-C  albuterol (VENTOLIN HFA) 108 (90 Base) MCG/ACT inhaler Inhale 2 puffs into the lungs every 6 (six) hours as needed for wheezing or shortness of breath. 12/17/19   Volanda Napoleon, PA-C  diazepam (VALIUM) 10 MG tablet Take 1 tablet (10 mg total) by mouth every 6 (six) hours as needed. [id number]   Delora Fuel, MD  escitalopram (LEXAPRO) 20 MG tablet Take 1 tablet by mouth daily. 04/05/20   [provider]  gabapentin (NEURONTIN) 300 MG capsule Take 1 capsule (300 mg total) by mouth 3 (three) times daily. 07/07/20   Evalee Jefferson, PA-C  HYDROcodone-acetaminophen (NORCO/VICODIN) 5-325 MG tablet Take 1 tablet by mouth every 4 (four) hours as needed for moderate  pain.  11/28/18   [provider]  ibuprofen (ADVIL) 600 MG tablet Take 1 tablet (600 mg total) by mouth every 6 (six) hours as needed. 11/29/18   Idol, Raynelle Fanning, PA-C  insulin lispro (HUMALOG) 100 UNIT/ML injection Inject 1-10 Units into the skin 3 (three) times daily before meals. Patient taking differently: 1-10 Units 3 (three) times daily with meals. Based on sliding scale/carb intake-via pump 02/03/11   Elsaid, Hind I, MD  methocarbamol (ROBAXIN-750) 750 MG tablet Take 1 tablet (750 mg total) by mouth every 8 (eight) hours as needed for muscle spasms. 11/29/18   Burgess Amor, PA-C    Allergies     Patient has no known allergies.  Review of Systems   Review of Systems  Constitutional:  Negative for chills and fever.  HENT:  Negative for congestion.   Respiratory:  Negative for shortness of breath.   Cardiovascular:  Negative for chest pain.       Left-sided rib pain.  Gastrointestinal:  Negative for abdominal pain.  Genitourinary:  Negative for enuresis.  Musculoskeletal:  Negative for back pain.  Skin:  Negative for rash.  Neurological:  Negative for dizziness.  Hematological:  Does not bruise/bleed easily.   Physical Exam Updated Vital Signs BP 111/82 (BP Location: Right Arm)   Pulse 100   Temp 98.2 F (36.8 C) (Oral)   Resp 16   SpO2 100%   Physical Exam Vitals and nursing note reviewed.  Constitutional:      General: He is not in acute distress.    Appearance: He is not ill-appearing.  HENT:     Head: Normocephalic and atraumatic.     Nose: No congestion.  Eyes:     Conjunctiva/sclera: Conjunctivae normal.  Cardiovascular:     Rate and Rhythm: Normal rate and regular rhythm.     Pulses: Normal pulses.     Heart sounds: No murmur heard.   No friction rub. No gallop.  Pulmonary:     Effort: No respiratory distress.     Breath sounds: No wheezing, rhonchi or rales.     Comments: Ribs were palpated he was tender to palpation along the mid axillary of the fifth and sixth rib, no crepitus or deformities present. Chest:     Chest wall: Tenderness present.  Musculoskeletal:     Right lower leg: No edema.     Left lower leg: No edema.  Skin:    General: Skin is warm and dry.  Neurological:     Mental Status: He is alert.  Psychiatric:        Mood and Affect: Mood normal.    ED Results / Procedures / Treatments   Labs (all labs ordered are listed, but only abnormal results are displayed) Labs Reviewed  BASIC METABOLIC PANEL - Abnormal; Notable for the following components:      Result Value   Sodium 133 (*)    Glucose, Bld 276 (*)    All other  components within normal limits  CBC WITH DIFFERENTIAL/PLATELET  D-DIMER, QUANTITATIVE    EKG None  Radiology DG Ribs Unilateral W/Chest Left  Result Date: 01/03/2021 CLINICAL DATA:  Left lateral rib pain, injury 2 days ago EXAM: LEFT RIBS AND CHEST - 3+ VIEW COMPARISON:  12/22/2020 FINDINGS: Frontal view of the chest as well as frontal and oblique views of the left thoracic cage are obtained. Cardiac silhouette is unremarkable. No airspace disease, effusion, or pneumothorax. There are no acute displaced rib fractures. IMPRESSION: 1. No acute intrathoracic  process. 2. No acute displaced rib fracture. Electronically Signed   By: Randa Ngo M.D.   On: 01/03/2021 19:59    Procedures Procedures   Medications Ordered in ED Medications - No data to display  ED Course  I have reviewed the triage vital signs and the nursing notes.  Pertinent labs & imaging results that were available during my care of the patient were reviewed by me and considered in my medical decision making (see chart for details).    MDM Rules/Calculators/A&P                          Initial impression-presents with left-sided rib pain.  He is alert, does not appear to be in distress, vital signs noted for tachycardia.  Unclear etiology I am concerned for possible PE as he has pleuritic chest pain and tachycardia.  Will obtain basic lab work-up add on D-dimer and reassess.  Work-up-CBC unremarkable, D-dimer negative, BMP shows sodium 133, glucose 267.  Rib x-ray, EKG sinus tach without signs of ischemia.  Rule out- I have low suspicion for ACS as history is atypical, patient has no cardiac history, EKG was sinus rhythm without signs of ischemia.  Low suspicion for PE, shortness of breath, patient denies leg pain, no pedal edema noted on exam, D-dimer is negative, presentation atypical etiology, nontachypneic, nonhypoxic.  Low suspicion for AAA or aortic dissection as history is atypical, patient has low risk factors.   Low suspicion for systemic infection as patient is nontoxic-appearing, vital signs reassuring, no obvious source infection noted on exam.  Low suspicion for pneumothorax as imaging is negative, no decreased breath sounds present my exam.   Plan-  Left-sided rib pain,-unclear etiology, likely this is muscular strain, provide a short course of narcotic medication, muscle relaxers, follow-up PCP for further evaluation.  Given strict return precautions.  Vital signs have remained stable, no indication for hospital admission.  Patient given at home care as well strict return precautions.  Patient verbalized that they understood agreed to said plan.  Final Clinical Impression(s) / ED Diagnoses Final diagnoses:  Rib pain    Rx / DC Orders ED Discharge Orders          Ordered    cyclobenzaprine (FLEXERIL) 10 MG tablet  2 times daily PRN        01/03/21 2344    oxyCODONE-acetaminophen (PERCOCET/ROXICET) 5-325 MG tablet  Every 8 hours PRN        01/03/21 2344             Marcello Fennel, PA-C 01/04/21 Henri Medal, MD 01/04/21 1626

## 2021-01-03 NOTE — ED Triage Notes (Signed)
Pt with left rib pain, coughed the other day and felt something pop.  Pt states pain increased after that.

## 2021-01-11 DIAGNOSIS — Z89021 Acquired absence of right finger(s): Secondary | ICD-10-CM | POA: Insufficient documentation

## 2021-01-12 DIAGNOSIS — Z89021 Acquired absence of right finger(s): Secondary | ICD-10-CM | POA: Diagnosis not present

## 2021-01-12 DIAGNOSIS — Z4781 Encounter for orthopedic aftercare following surgical amputation: Secondary | ICD-10-CM | POA: Diagnosis not present

## 2021-02-08 DIAGNOSIS — E109 Type 1 diabetes mellitus without complications: Secondary | ICD-10-CM | POA: Diagnosis not present

## 2021-02-13 ENCOUNTER — Encounter (HOSPITAL_COMMUNITY): Payer: Self-pay | Admitting: Emergency Medicine

## 2021-02-13 ENCOUNTER — Other Ambulatory Visit: Payer: Self-pay

## 2021-02-13 ENCOUNTER — Emergency Department (HOSPITAL_COMMUNITY)
Admission: EM | Admit: 2021-02-13 | Discharge: 2021-02-14 | Disposition: A | Discharge status: Home / Self Care | Payer: BC Managed Care – PPO | Attending: Emergency Medicine | Admitting: Emergency Medicine

## 2021-02-13 DIAGNOSIS — Z720 Tobacco use: Secondary | ICD-10-CM | POA: Insufficient documentation

## 2021-02-13 DIAGNOSIS — E119 Type 2 diabetes mellitus without complications: Secondary | ICD-10-CM | POA: Insufficient documentation

## 2021-02-13 DIAGNOSIS — G8929 Other chronic pain: Secondary | ICD-10-CM | POA: Diagnosis not present

## 2021-02-13 DIAGNOSIS — M25512 Pain in left shoulder: Secondary | ICD-10-CM | POA: Insufficient documentation

## 2021-02-13 DIAGNOSIS — I10 Essential (primary) hypertension: Secondary | ICD-10-CM | POA: Diagnosis not present

## 2021-02-13 NOTE — ED Triage Notes (Signed)
Pt c/o left shoulder pain for a while with no known injury.

## 2021-02-14 ENCOUNTER — Emergency Department (HOSPITAL_COMMUNITY): Payer: BC Managed Care – PPO

## 2021-02-14 DIAGNOSIS — M25512 Pain in left shoulder: Secondary | ICD-10-CM | POA: Diagnosis not present

## 2021-02-14 MED ORDER — ACETAMINOPHEN 500 MG PO TABS
1000.0000 mg | ORAL_TABLET | Freq: Once | ORAL | Status: AC
  Administered 2021-02-14: 1000 mg via ORAL
  Filled 2021-02-14: qty 2

## 2021-02-14 MED ORDER — NAPROXEN 250 MG PO TABS
500.0000 mg | ORAL_TABLET | Freq: Once | ORAL | Status: AC
  Administered 2021-02-14: 500 mg via ORAL
  Filled 2021-02-14: qty 2

## 2021-02-14 NOTE — ED Provider Notes (Signed)
[facility]-EMERGENCY DEPT [facility] Emergency Department Provider Note MRN:  [id number]  Arrival date & time: 02/14/21     Chief Complaint   Shoulder Pain   History of Present Illness   Eric [name] is a 36 y.o. year-old male with a history of hypertension, diabetes, depression presenting to the ED with chief complaint of shoulder pain.  6 months of left shoulder pain, seems to be bothering him more recently.  Hurts in the middle of the night sometimes.  Has trouble raising the arm over his head.  Denies any recent trauma.  No fever, no other complaints.  Review of Systems  A thorough review of systems was obtained and all systems are negative except as noted in the HPI and PMH.   Patient's Health History    Past Medical History:  Diagnosis Date   Anxiety    Depression    Diabetes mellitus    Hypertension     Past Surgical History:  Procedure Laterality Date   AMPUTATION Right    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   FRACTURE SURGERY     reconstructive left knee surgery   KNEE SURGERY      Family History  Problem Relation Age of Onset   Hypertension Mother    Hypertension Father     Social History   Socioeconomic History   Marital status: Married    Spouse name: Not on file   Number of children: Not on file   Years of education: Not on file   Highest education level: Not on file  Occupational History   Not on file  Tobacco Use   Smoking status: Never   Smokeless tobacco: Current    Types: Snuff  Vaping Use   Vaping Use: Never used  Substance and Sexual Activity   Alcohol use: Yes    Comment: occ   Drug use: No   Sexual activity: Not on file  Other Topics Concern   Not on file  Social History Narrative   Not on file   Social Determinants of Health   Financial Resource Strain: Not on file  Food Insecurity: Not on file  Transportation Needs: Not on file  Physical Activity: Not on file  Stress: Not on file  Social Connections: Not on file  Intimate  Partner Violence: Not on file     Physical Exam  There were no vitals filed for this visit.  CONSTITUTIONAL: Well-appearing, NAD NEURO:  Alert and oriented x 3, no focal deficits EYES:  eyes equal and reactive ENT/NECK:  no LAD, no JVD CARDIO: Regular rate, well-perfused, normal S1 and S2 PULM:  CTAB no wheezing or rhonchi GI/GU:  non-distended, non-tender MSK/SPINE:  No gross deformities, no edema, minimal decrease in range of motion of the left shoulder, neurovascularly intact SKIN:  no rash, atraumatic   *Additional and/or pertinent findings included in MDM below  Diagnostic and Interventional Summary    EKG Interpretation  Date/Time:    Ventricular Rate:    PR Interval:    QRS Duration:   QT Interval:    QTC Calculation:   R Axis:     Text Interpretation:         Labs Reviewed - No data to display  DG Shoulder Left  Final Result      Medications  acetaminophen (TYLENOL) tablet 1,000 mg (1,000 mg Oral Given 02/14/21 0101)  naproxen (NAPROSYN) tablet 500 mg (500 mg Oral Given 02/14/21 0101)     Procedures  /  Critical  Care Procedures  ED Course and Medical Decision Making  Initial Impression and Ddx Chronic shoulder pain, intact range of motion, no fever, nothing to suggest septic joint or any other emergency.  Suspect rotator cuff injury.  Interpretation of Diagnostics I personally reviewed the shoulder x-ray and my interpretation is as follows: No obvious fracture or dislocation      Patient Reassessment and Ultimate Disposition/Management Patient appropriate for discharge, reassurance, follow-up with specialist.    Complexity of Problems Addressed Chronic illness with exacerbation  Additional Data Reviewed and Analyzed Further history obtained from: Past medical history and medications listed in the EMR  Patient Encounter Risk Assessment Low  Elmer Sow. Pilar Plate, MD [facility] Emergency Medicine [facility]  Health [email or web address]  Final Clinical Impressions(s) / ED Diagnoses     ICD-10-CM   1. Chronic left shoulder pain  M25.512    G89.29       ED Discharge Orders     None        Discharge Instructions Discussed with and Provided to Patient:    Discharge Instructions      You were evaluated in the Emergency Department and after careful evaluation, we did not find any emergent condition requiring admission or further testing in the hospital.  Your exam/testing today is overall reassuring.  Shoulder x-ray was normal.  Recommend follow-up with orthopedic specialist to discuss your symptoms and pain.  Until then use Tylenol or Motrin for discomfort.  Please return to the Emergency Department if you experience any worsening of your condition.   Thank you for allowing [place] to be a part of your care.      Sabas Sous, MD 02/14/21 [phone]

## 2021-02-14 NOTE — Discharge Instructions (Signed)
You were evaluated in the Emergency Department and after careful evaluation, we did not find any emergent condition requiring admission or further testing in the hospital.  Your exam/testing today is overall reassuring.  Shoulder x-ray was normal.  Recommend follow-up with orthopedic specialist to discuss your symptoms and pain.  Until then use Tylenol or Motrin for discomfort.  Please return to the Emergency Department if you experience any worsening of your condition.   Thank you for allowing [place] to be a part of your care.

## 2021-02-22 ENCOUNTER — Ambulatory Visit: Payer: BC Managed Care – PPO | Admitting: Orthopedic Surgery

## 2021-02-22 ENCOUNTER — Encounter: Payer: Self-pay | Admitting: Orthopedic Surgery

## 2021-02-22 ENCOUNTER — Other Ambulatory Visit: Payer: Self-pay

## 2021-02-22 VITALS — BP 186/119 | HR 116 | Ht 69.0 in | Wt 162.0 lb

## 2021-02-22 DIAGNOSIS — M25512 Pain in left shoulder: Secondary | ICD-10-CM

## 2021-02-22 DIAGNOSIS — G8929 Other chronic pain: Secondary | ICD-10-CM

## 2021-02-22 DIAGNOSIS — S68119A Complete traumatic metacarpophalangeal amputation of unspecified finger, initial encounter: Secondary | ICD-10-CM | POA: Insufficient documentation

## 2021-02-22 MED ORDER — MELOXICAM 7.5 MG PO TABS: 7.5000 mg | ORAL_TABLET | Freq: Two times a day (BID) | ORAL | 5 refills | Status: DC

## 2021-02-22 NOTE — Patient Instructions (Signed)
Physical therapy has been ordered for you at . They should call you to schedule, [phone] is the phone number to call, if you want to call to schedule.   

## 2021-02-22 NOTE — Progress Notes (Signed)
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 but comes in today complaining of left shoulder pain which is been bothering him for about 6 months worsening over the last month causing ER presentation  X-ray was negative  Patient was advised to take Motrin and see orthopedics for follow-up  He says he has trouble lifting his arm and although he can do it is very painful  No trauma noted but again he did work at [employer]  Past Medical History:  Diagnosis Date   Anxiety    Depression    Diabetes mellitus    Hypertension     BP (!) 186/119    Pulse (!) 116    Ht 5\' 9"  (1.753 m)    Wt 162 lb (73.5 kg)    BMI 23.92 kg/m    General appearance: Well-developed well-nourished no gross deformities  Cardiovascular normal pulse and perfusion normal color without edema  Neurologically no sensation loss or deficits or pathologic reflexes  Psychological: Awake alert and oriented x3 mood and affect normal  Skin no lacerations or ulcerations no nodularity no palpable masses, no erythema or nodularity  Musculoskeletal: Left shoulder No atrophy of the musculature scapulothoracic rhythm normal he has full forward elevation with pain at 120 degrees of flexion has a positive impingement sign good strength in his rotator cuff in the empty can position shoulder is stable skin is intact  Imaging left shoulder image on February 14, 2021 at [facility]  My interpretation of the images as follows  3 views of the left shoulder no fracture dislocation glenoid view looks normal    A/P  Probable impingement syndrome left shoulder  Recommend meloxicam 7.5 mg twice a day injection left shoulder physical therapy  Follow-up 8 weeks  Procedure  note the subacromial injection shoulder left   Verbal consent was obtained to inject the  Left   Shoulder  Timeout was completed to confirm the injection site is a subacromial space of the  left  shoulder  Medication used Depo-Medrol 40 mg and lidocaine 1% 3 cc  Anesthesia was provided by ethyl chloride  The injection was performed in the left  posterior subacromial space. After pinning the skin with alcohol and anesthetized the skin with ethyl chloride the subacromial space was injected using a 20-gauge needle. There were no complications  Sterile dressing was applied.

## 2021-04-03 ENCOUNTER — Telehealth: Payer: Self-pay | Admitting: Orthopedic Surgery

## 2021-04-03 NOTE — Telephone Encounter (Signed)
I called the patient to R/S his appt on 04/20/21. Patient wants to know if he can come in and get another injection now.  He states the medicine you gave him is not working   Please call the patient back at [phone]

## 2021-04-06 ENCOUNTER — Ambulatory Visit: Payer: BC Managed Care – PPO | Admitting: Orthopedic Surgery

## 2021-04-07 DIAGNOSIS — E1065 Type 1 diabetes mellitus with hyperglycemia: Secondary | ICD-10-CM | POA: Diagnosis not present

## 2021-04-13 ENCOUNTER — Ambulatory Visit: Payer: BC Managed Care – PPO | Admitting: Orthopedic Surgery

## 2021-04-20 ENCOUNTER — Ambulatory Visit: Payer: BC Managed Care – PPO | Admitting: Orthopedic Surgery

## 2021-04-27 DIAGNOSIS — E109 Type 1 diabetes mellitus without complications: Secondary | ICD-10-CM | POA: Diagnosis not present

## 2021-05-18 ENCOUNTER — Ambulatory Visit: Payer: BC Managed Care – PPO | Admitting: Orthopedic Surgery

## 2021-05-22 ENCOUNTER — Ambulatory Visit: Payer: BC Managed Care – PPO | Admitting: Orthopedic Surgery

## 2021-05-22 NOTE — Progress Notes (Deleted)
FOLLOW UP  ? ?No diagnosis found. ? ? ?No chief complaint on file. ? ? ?Chief Complaint  ?Patient presents with  ? Shoulder Pain  ?    Left shoulder pain for months getting worse   ?  ?  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 ?X-ray was negative ? ?Patient was advised to take Motrin and see orthopedics for follow-up ? ?He says he has trouble lifting his arm and although he can do it is very painful ? ?No trauma noted but again he did work at [employer] ?*** ?

## 2021-06-15 DIAGNOSIS — F4323 Adjustment disorder with mixed anxiety and depressed mood: Secondary | ICD-10-CM | POA: Diagnosis not present

## 2021-07-04 DIAGNOSIS — E1065 Type 1 diabetes mellitus with hyperglycemia: Secondary | ICD-10-CM | POA: Diagnosis not present

## 2021-07-04 DIAGNOSIS — E782 Mixed hyperlipidemia: Secondary | ICD-10-CM | POA: Diagnosis not present

## 2021-07-05 DIAGNOSIS — E1065 Type 1 diabetes mellitus with hyperglycemia: Secondary | ICD-10-CM | POA: Diagnosis not present

## 2021-07-05 DIAGNOSIS — F331 Major depressive disorder, recurrent, moderate: Secondary | ICD-10-CM | POA: Diagnosis not present

## 2021-07-05 DIAGNOSIS — Z0000 Encounter for general adult medical examination without abnormal findings: Secondary | ICD-10-CM | POA: Diagnosis not present

## 2021-07-05 DIAGNOSIS — M25512 Pain in left shoulder: Secondary | ICD-10-CM | POA: Diagnosis not present

## 2021-07-05 DIAGNOSIS — E782 Mixed hyperlipidemia: Secondary | ICD-10-CM | POA: Diagnosis not present

## 2021-07-05 DIAGNOSIS — F411 Generalized anxiety disorder: Secondary | ICD-10-CM | POA: Diagnosis not present

## 2021-07-10 ENCOUNTER — Emergency Department (HOSPITAL_COMMUNITY)
Admission: EM | Admit: 2021-07-10 | Discharge: 2021-07-10 | Disposition: A | Discharge status: Home / Self Care | Payer: BC Managed Care – PPO | Attending: Emergency Medicine | Admitting: Emergency Medicine

## 2021-07-10 ENCOUNTER — Other Ambulatory Visit: Payer: Self-pay

## 2021-07-10 ENCOUNTER — Encounter (HOSPITAL_COMMUNITY): Payer: Self-pay | Admitting: *Deleted

## 2021-07-10 DIAGNOSIS — R739 Hyperglycemia, unspecified: Secondary | ICD-10-CM | POA: Diagnosis not present

## 2021-07-10 DIAGNOSIS — R000 Tachycardia, unspecified: Secondary | ICD-10-CM | POA: Diagnosis not present

## 2021-07-10 DIAGNOSIS — R112 Nausea with vomiting, unspecified: Secondary | ICD-10-CM | POA: Insufficient documentation

## 2021-07-10 DIAGNOSIS — E1110 Type 2 diabetes mellitus with ketoacidosis without coma: Secondary | ICD-10-CM | POA: Diagnosis not present

## 2021-07-10 DIAGNOSIS — E1165 Type 2 diabetes mellitus with hyperglycemia: Secondary | ICD-10-CM | POA: Insufficient documentation

## 2021-07-10 DIAGNOSIS — Z794 Long term (current) use of insulin: Secondary | ICD-10-CM | POA: Diagnosis not present

## 2021-07-10 LAB — URINALYSIS, ROUTINE W REFLEX MICROSCOPIC
Bilirubin Urine: NEGATIVE
Glucose, UA: >=500 mg/dL — AB
Hgb urine dipstick: NEGATIVE
Ketones, ur: 20 mg/dL — AB
Leukocytes,Ua: NEGATIVE
Nitrite: NEGATIVE
Protein, ur: 30 mg/dL — AB
Specific Gravity, Urine: 1.029 (ref 1.005–1.030)
pH: 5 (ref 5.0–8.0)

## 2021-07-10 LAB — BASIC METABOLIC PANEL
Anion gap: 4 — ABNORMAL LOW (ref 5–15)
BUN: 18 mg/dL (ref 6–20)
CO2: 30 mmol/L (ref 22–32)
Calcium: 8.6 mg/dL — ABNORMAL LOW (ref 8.9–10.3)
Chloride: 102 mmol/L (ref 98–111)
Creatinine, Ser: 0.9 mg/dL (ref 0.61–1.24)
GFR, Estimated: >60 mL/min (ref >60)
Glucose, Bld: 295 mg/dL — ABNORMAL HIGH (ref 70–99)
Potassium: 3.8 mmol/L (ref 3.5–5.1)
Sodium: 136 mmol/L (ref 135–145)

## 2021-07-10 LAB — CBG MONITORING, ED
Glucose-Capillary: 523 mg/dL (ref 70–99)
Glucose-Capillary: 491 mg/dL — ABNORMAL HIGH (ref 70–99)
Glucose-Capillary: 493 mg/dL — ABNORMAL HIGH (ref 70–99)
Glucose-Capillary: 342 mg/dL — ABNORMAL HIGH (ref 70–99)
Glucose-Capillary: 281 mg/dL — ABNORMAL HIGH (ref 70–99)
Glucose-Capillary: 194 mg/dL — ABNORMAL HIGH (ref 70–99)
Glucose-Capillary: 191 mg/dL — ABNORMAL HIGH (ref 70–99)

## 2021-07-10 LAB — COMPREHENSIVE METABOLIC PANEL
ALT: 20 U/L (ref 0–44)
AST: 12 U/L — ABNORMAL LOW (ref 15–41)
Albumin: 4.3 g/dL (ref 3.5–5.0)
Alkaline Phosphatase: 102 U/L (ref 38–126)
Anion gap: 12 (ref 5–15)
BUN: 24 mg/dL — ABNORMAL HIGH (ref 6–20)
CO2: 28 mmol/L (ref 22–32)
Calcium: 9.3 mg/dL (ref 8.9–10.3)
Chloride: 89 mmol/L — ABNORMAL LOW (ref 98–111)
Creatinine, Ser: 1.17 mg/dL (ref 0.61–1.24)
GFR, Estimated: >60 mL/min (ref >60)
Glucose, Bld: 510 mg/dL (ref 70–99)
Potassium: 4.8 mmol/L (ref 3.5–5.1)
Sodium: 129 mmol/L — ABNORMAL LOW (ref 135–145)
Total Bilirubin: 1.3 mg/dL — ABNORMAL HIGH (ref 0.3–1.2)
Total Protein: 6.6 g/dL (ref 6.5–8.1)

## 2021-07-10 LAB — CBC WITH DIFFERENTIAL/PLATELET
Abs Immature Granulocytes: 0.01 10*3/uL (ref 0.00–0.07)
Basophils Absolute: 0.1 10*3/uL (ref 0.0–0.1)
Basophils Relative: 1 %
Eosinophils Absolute: 0.1 10*3/uL (ref 0.0–0.5)
Eosinophils Relative: 2 %
HCT: 41.5 % (ref 39.0–52.0)
Hemoglobin: 15.5 g/dL (ref 13.0–17.0)
Immature Granulocytes: 0 %
Lymphocytes Relative: 19 %
Lymphs Abs: 1 10*3/uL (ref 0.7–4.0)
MCH: 29.1 pg (ref 26.0–34.0)
MCHC: 37.3 g/dL — ABNORMAL HIGH (ref 30.0–36.0)
MCV: 78 fL — ABNORMAL LOW (ref 80.0–100.0)
Monocytes Absolute: 0.4 10*3/uL (ref 0.1–1.0)
Monocytes Relative: 7 %
Neutro Abs: 3.9 10*3/uL (ref 1.7–7.7)
Neutrophils Relative %: 71 %
Platelets: 230 10*3/uL (ref 150–400)
RBC: 5.32 MIL/uL (ref 4.22–5.81)
RDW: 11.6 % (ref 11.5–15.5)
WBC: 5.5 10*3/uL (ref 4.0–10.5)
nRBC: 0 % (ref 0.0–0.2)

## 2021-07-10 LAB — BLOOD GAS, VENOUS
Acid-Base Excess: 4.4 mmol/L — ABNORMAL HIGH (ref 0.0–2.0)
Bicarbonate: 31.2 mmol/L — ABNORMAL HIGH (ref 20.0–28.0)
Drawn by: 6939
O2 Saturation: 72.9 %
Patient temperature: 36.5
pCO2, Ven: 53 mmHg (ref 44–60)
pH, Ven: 7.38 (ref 7.25–7.43)
pO2, Ven: 39 mmHg (ref 32–45)

## 2021-07-10 LAB — LIPASE, BLOOD: Lipase: 27 U/L (ref 11–51)

## 2021-07-10 LAB — BETA-HYDROXYBUTYRIC ACID
Beta-Hydroxybutyric Acid: 3.23 mmol/L — ABNORMAL HIGH (ref 0.05–0.27)
Beta-Hydroxybutyric Acid: 0.71 mmol/L — ABNORMAL HIGH (ref 0.05–0.27)

## 2021-07-10 MED ORDER — INSULIN REGULAR(HUMAN) IN NACL 100-0.9 UT/100ML-% IV SOLN
INTRAVENOUS | Status: DC
  Administered 2021-07-10: 10 [IU]/h via INTRAVENOUS
  Filled 2021-07-10: qty 100

## 2021-07-10 MED ORDER — SODIUM CHLORIDE 0.9 % IV BOLUS
1000.0000 mL | Freq: Once | INTRAVENOUS | Status: AC
  Administered 2021-07-10: 1000 mL via INTRAVENOUS

## 2021-07-10 MED ORDER — ONDANSETRON HCL 4 MG PO TABS: 4.0000 mg | ORAL_TABLET | Freq: Four times a day (QID) | ORAL | 0 refills | Status: DC

## 2021-07-10 MED ORDER — LACTATED RINGERS IV SOLN: INTRAVENOUS | Status: DC

## 2021-07-10 MED ORDER — TRAMADOL HCL 50 MG PO TABS: 50.0000 mg | ORAL_TABLET | Freq: Three times a day (TID) | ORAL | 0 refills | Status: AC | PRN

## 2021-07-10 MED ORDER — LACTATED RINGERS IV BOLUS
20.0000 mL/kg | Freq: Once | INTRAVENOUS | Status: AC
  Administered 2021-07-10: 1424 mL via INTRAVENOUS

## 2021-07-10 MED ORDER — DEXTROSE IN LACTATED RINGERS 5 % IV SOLN: INTRAVENOUS | Status: DC

## 2021-07-10 MED ORDER — DEXTROSE 50 % IV SOLN: 0.0000 mL | INTRAVENOUS | Status: DC | PRN

## 2021-07-10 NOTE — ED Provider Notes (Signed)
Patient signed out to me by previous provider. Please refer to their note for full HPI.  Briefly this is a 36 year old type I diabetic with an insulin pump and presented to the emergency department with hyperglycemia.  Blood work shows hyperglycemia without findings of DKA.  Plan to treat and hydrate here for reevaluation. Physical Exam  BP 138/88   Pulse 83   Temp 97.8 F (36.6 C) (Oral)   Resp 14   Ht 5\' 9"  (1.753 m)   Wt 71.2 kg   SpO2 99%   BMI 23.18 kg/m   Physical Exam Vitals and nursing note reviewed.  Constitutional:      Appearance: Normal appearance.  HENT:     Head: Normocephalic.     Mouth/Throat:     Mouth: Mucous membranes are moist.  Cardiovascular:     Rate and Rhythm: Normal rate.  Pulmonary:     Effort: Pulmonary effort is normal. No respiratory distress.  Abdominal:     Palpations: Abdomen is soft.     Tenderness: There is no abdominal tenderness.  Skin:    General: Skin is warm.  Neurological:     Mental Status: He is alert and oriented to person, place, and time. Mental status is at baseline.  Psychiatric:        Mood and Affect: Mood normal.    Procedures  Procedures  ED Course / MDM    Medical Decision Making Amount and/or Complexity of Data Reviewed Labs: ordered.  Risk Prescription drug management.   Patient's blood sugar has appropriately reduced.  The girlfriend has showed up.  They now admits that he slept most of the day yesterday with an empty insulin pump.  So he most likely did not receive any of his doses yesterday which is probably what instigated the hyperglycemia.  Again no evidence of DKA, patient has been able to eat and drink here.  He has all supplies at home.  He will change his insulin pump site and make sure that it is full tonight.  He will do extra blood sugar checks overnight and reach out to his pump rep/endocrinologist tomorrow for further evaluation if needed.  If he has any difficulty with blood sugar control at home  he understands to return immediately.  Side note, patient has rotator cuff injury that he usually receives cortisone shots for.  He has follow-up with his doctor later this week but is requesting a couple days of tramadol till he can get into the doctor.  This has been filled.  Patient at this time appears safe and stable for discharge and close outpatient follow up. Discharge plan and strict return to ED precautions discussed, patient verbalizes understanding and agreement.       , DO 07/10/21 1935

## 2021-07-10 NOTE — ED Triage Notes (Signed)
Pt with type 1 DM and on insulin pump.  States his blood sugars have been over 600 for past 2 days with emesis.

## 2021-07-10 NOTE — ED Provider Notes (Signed)
[facility] EMERGENCY DEPARTMENT Provider Note   CSN: [id number] Arrival date & time: 07/10/21  1122     History  Chief Complaint  Patient presents with   Hyperglycemia    Eric [name] is a 36 y.o. male.   Hyperglycemia  This patient is a 36 year old male, he has a known history of diabetes, he uses an insulin pump, he states that over the last 2 days he has had nausea and vomiting with some progressive hyperglycemia and his last measurements of been well over 600 and on measurably high.  This prompted him to use a dose of his home insulin, 16 units of NovoLog, he continues to feel poorly.  He denies any difficulty breathing but has some chest discomfort back discomfort abdominal discomfort in general, very vague.  No swelling of the legs, no blood in the stools, mild headache, generalized weakness.  He presents tachycardic and dehydrated appearing, denies any dietary indiscretions to any significant degree  Home Medications Prior to Admission medications   Medication Sig Start Date End Date Taking? Authorizing Provider  ARIPiprazole (ABILIFY) 2 MG tablet Take 2 mg by mouth daily. 07/05/21  Yes [provider]  diazepam (VALIUM) 10 MG tablet Take 1 tablet (10 mg total) by mouth every 6 (six) hours as needed. Patient taking differently: Take 10 mg by mouth every 6 (six) hours as needed for anxiety. 07/07/20  Yes Dione Booze, MD  escitalopram (LEXAPRO) 20 MG tablet Take 1 tablet by mouth daily. 04/05/20  Yes [provider]  hydrOXYzine (VISTARIL) 25 MG capsule Take 25 mg by mouth 3 (three) times daily. 07/05/21  Yes [provider]  insulin lispro (HUMALOG) 100 UNIT/ML injection Inject 1-10 Units into the skin 3 (three) times daily before meals. Patient taking differently: 1-10 Units 3 (three) times daily with meals. Based on sliding scale/carb intake-via pump 02/03/11  Yes Elsaid, Hind I, MD  meloxicam (MOBIC) 7.5 MG tablet Take 1 tablet (7.5 mg total) by  mouth 2 (two) times daily. 02/22/21  Yes Vickki Hearing, MD  ondansetron (ZOFRAN) 4 MG tablet Take 1 tablet (4 mg total) by mouth every 6 (six) hours. 07/10/21  Yes Eber Hong, MD  albuterol (VENTOLIN HFA) 108 (90 Base) MCG/ACT inhaler Inhale 2 puffs into the lungs every 6 (six) hours as needed for wheezing or shortness of breath. Patient not taking: Reported on 02/22/2021 12/17/19   Graciella Freer A, PA-C  gabapentin (NEURONTIN) 300 MG capsule Take 1 capsule (300 mg total) by mouth 3 (three) times daily. Patient not taking: Reported on 07/10/2021 07/07/20   Burgess Amor, PA-C      Allergies    Patient has no known allergies.    Review of Systems   Review of Systems  All other systems reviewed and are negative.  Physical Exam Updated Vital Signs BP 118/69   Pulse 85   Temp 97.8 F (36.6 C) (Oral)   Resp 15   Ht 1.753 m (5\' 9" )   Wt 71.2 kg   SpO2 96%   BMI 23.18 kg/m  Physical Exam Vitals and nursing note reviewed.  Constitutional:      General: He is not in acute distress.    Appearance: He is well-developed.  HENT:     Head: Normocephalic and atraumatic.     Mouth/Throat:     Mouth: Mucous membranes are dry.     Pharynx: No oropharyngeal exudate.  Eyes:     General: No scleral icterus.  Right eye: No discharge.        Left eye: No discharge.     Conjunctiva/sclera: Conjunctivae normal.     Pupils: Pupils are equal, round, and reactive to light.  Neck:     Thyroid: No thyromegaly.     Vascular: No JVD.  Cardiovascular:     Rate and Rhythm: Regular rhythm. Tachycardia present.     Heart sounds: Normal heart sounds. No murmur heard.   No friction rub. No gallop.  Pulmonary:     Effort: Pulmonary effort is normal. No respiratory distress.     Breath sounds: Normal breath sounds. No wheezing or rales.  Abdominal:     General: Bowel sounds are normal. There is no distension.     Palpations: Abdomen is soft. There is no mass.     Tenderness: There is no  abdominal tenderness.  Musculoskeletal:        General: No tenderness. Normal range of motion.     Cervical back: Normal range of motion and neck supple.  Lymphadenopathy:     Cervical: No cervical adenopathy.  Skin:    General: Skin is warm and dry.     Findings: No erythema or rash.  Neurological:     Mental Status: He is alert.     Coordination: Coordination normal.  Psychiatric:        Behavior: Behavior normal.    ED Results / Procedures / Treatments   Labs (all labs ordered are listed, but only abnormal results are displayed) Labs Reviewed  BETA-HYDROXYBUTYRIC ACID - Abnormal; Notable for the following components:      Result Value   Beta-Hydroxybutyric Acid 3.23 (*)    All other components within normal limits  CBC WITH DIFFERENTIAL/PLATELET - Abnormal; Notable for the following components:   MCV 78.0 (*)    MCHC 37.3 (*)    All other components within normal limits  URINALYSIS, ROUTINE W REFLEX MICROSCOPIC - Abnormal; Notable for the following components:   Glucose, UA >=500 (*)    Ketones, ur 20 (*)    Protein, ur 30 (*)    Bacteria, UA RARE (*)    All other components within normal limits  BLOOD GAS, VENOUS - Abnormal; Notable for the following components:   Bicarbonate 31.2 (*)    Acid-Base Excess 4.4 (*)    All other components within normal limits  COMPREHENSIVE METABOLIC PANEL - Abnormal; Notable for the following components:   Sodium 129 (*)    Chloride 89 (*)    Glucose, Bld 510 (*)    BUN 24 (*)    AST 12 (*)    Total Bilirubin 1.3 (*)    All other components within normal limits  CBG MONITORING, ED - Abnormal; Notable for the following components:   Glucose-Capillary 523 (*)    All other components within normal limits  CBG MONITORING, ED - Abnormal; Notable for the following components:   Glucose-Capillary 491 (*)    All other components within normal limits  CBG MONITORING, ED - Abnormal; Notable for the following components:    Glucose-Capillary 493 (*)    All other components within normal limits  CBG MONITORING, ED - Abnormal; Notable for the following components:   Glucose-Capillary 342 (*)    All other components within normal limits  LIPASE, BLOOD  BETA-HYDROXYBUTYRIC ACID  BASIC METABOLIC PANEL  BETA-HYDROXYBUTYRIC ACID    EKG EKG Interpretation  Date/Time:  Monday Jul 10 2021 11:41:31 EDT Ventricular Rate:  102 PR Interval:  149  QRS Duration: 81 QT Interval:  331 QTC Calculation: 432 R Axis:   48 Text Interpretation: Sinus tachycardia Confirmed by Eber Hong [phone]) on 07/10/2021 1:25:55 PM  Radiology No results found.  Procedures Procedures    Medications Ordered in ED Medications  insulin regular, human (MYXREDLIN) 100 units/ 100 mL infusion (10 Units/hr Intravenous New Bag/Given 07/10/21 1320)  lactated ringers infusion (0 mLs Intravenous Stopped 07/10/21 1426)  dextrose 5 % in lactated ringers infusion (has no administration in time range)  dextrose 50 % solution 0-50 mL (has no administration in time range)  lactated ringers bolus 1,424 mL (0 mLs Intravenous Stopped 07/10/21 1404)  sodium chloride 0.9 % bolus 1,000 mL (1,000 mLs Intravenous New Bag/Given 07/10/21 1407)    ED Course/ Medical Decision Making/ A&P                           Medical Decision Making Amount and/or Complexity of Data Reviewed Labs: ordered.  Risk Prescription drug management.   Insulin pump located in the right abdomen  This patient presents to the ED for concern of diabetic ketoacidosis, this involves an extensive number of treatment options, and is a complaint that carries with it a high risk of complications and morbidity.  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  I do not see any recent or distant history of significant diabetes related issues since 2019 when he was admitted for dehydration related to his diabetes   Lab Tests:  I Ordered, and personally interpreted labs.  The pertinent results include: Basic metabolic panel with no anion gap, CO2 seems reasonable, beta hydroxybutyrate is present in the blood, glucose was initially over 500 then reduced to 490 then down to 340 on the insulin drip after IV fluids    Cardiac Monitoring: / EKG:  The patient was maintained on a cardiac monitor.  I personally viewed and interpreted the cardiac monitored which showed an underlying rhythm of: Sinus tachycardia improving to sinus rhythm   Consultations Obtained:  Basic metabolic pending at the time of change of shift, care signed out to oncoming physician Dr. Wilkie Aye to follow-up results and disposition accordingly, the patient does not appear to be in DKA, has had fluids, insulin and is improving   Problem List / ED Course / Critical interventions / Medication management  Hyperglycemia, borderline DKA I ordered medication including IV fluids, IV insulin for hyperglycemia Reevaluation of the patient after these medicines showed that the patient improved I have reviewed the patients home medicines and have made adjustments as needed   Social Determinants of Health:  Diabetic, insulin dependent   Test / Admission - Considered:  Considered admission, recheck basic metabolic panel to see if the patient is improving, time of change of shift, care signed out to oncoming physician         Final Clinical Impression(s) / ED Diagnoses Final diagnoses:  Hyperglycemia  Nausea and vomiting,  unspecified vomiting type    Rx / DC Orders ED Discharge Orders  Ordered    ondansetron (ZOFRAN) 4 MG tablet  Every 6 hours        07/10/21 1550              Eber HongMiller, Ambar Raphael, MD 07/10/21 [id number]

## 2021-07-10 NOTE — Discharge Instructions (Addendum)
Change to the side of her insulin pump when you get home.  Ensure that it is full with medication.  Take extra caution with your blood sugar readings overnight to ensure that the insulin pump is functioning properly.  If there is any dysfunction please reach out to your pump rep/endocrinologist.  If there is any concern for uncontrolled hyperglycemia please return to the emergency department immediately for reevaluation/treatment.  Take pain medicine as needed and as directed.  Do not mix this medication with alcohol or other sedating medications. Do not drive or do heavy physical activity until you know how this medication affects you.  It may cause drowsiness.

## 2021-07-10 NOTE — ED Triage Notes (Signed)
Vision is blurry.

## 2021-07-17 DIAGNOSIS — E109 Type 1 diabetes mellitus without complications: Secondary | ICD-10-CM | POA: Diagnosis not present

## 2021-07-23 DIAGNOSIS — W19XXXA Unspecified fall, initial encounter: Secondary | ICD-10-CM | POA: Diagnosis not present

## 2021-07-23 DIAGNOSIS — R42 Dizziness and giddiness: Secondary | ICD-10-CM | POA: Diagnosis not present

## 2021-07-27 ENCOUNTER — Other Ambulatory Visit: Payer: Self-pay | Admitting: Orthopedic Surgery

## 2021-07-27 DIAGNOSIS — G8929 Other chronic pain: Secondary | ICD-10-CM

## 2021-08-07 DIAGNOSIS — M25512 Pain in left shoulder: Secondary | ICD-10-CM | POA: Diagnosis not present

## 2021-08-07 DIAGNOSIS — S43492A Other sprain of left shoulder joint, initial encounter: Secondary | ICD-10-CM | POA: Diagnosis not present

## 2021-08-07 DIAGNOSIS — M19012 Primary osteoarthritis, left shoulder: Secondary | ICD-10-CM | POA: Diagnosis not present

## 2021-08-07 DIAGNOSIS — S46212A Strain of muscle, fascia and tendon of other parts of biceps, left arm, initial encounter: Secondary | ICD-10-CM | POA: Diagnosis not present

## 2021-08-07 DIAGNOSIS — M67912 Unspecified disorder of synovium and tendon, left shoulder: Secondary | ICD-10-CM | POA: Diagnosis not present

## 2021-08-08 ENCOUNTER — Emergency Department (HOSPITAL_COMMUNITY): Payer: BC Managed Care – PPO

## 2021-08-08 ENCOUNTER — Encounter (HOSPITAL_COMMUNITY): Payer: Self-pay | Admitting: Emergency Medicine

## 2021-08-08 ENCOUNTER — Other Ambulatory Visit: Payer: Self-pay

## 2021-08-08 ENCOUNTER — Emergency Department (HOSPITAL_COMMUNITY)
Admission: EM | Admit: 2021-08-08 | Discharge: 2021-08-08 | Disposition: A | Discharge status: Home / Self Care | Payer: BC Managed Care – PPO | Attending: Emergency Medicine | Admitting: Emergency Medicine

## 2021-08-08 DIAGNOSIS — L089 Local infection of the skin and subcutaneous tissue, unspecified: Secondary | ICD-10-CM

## 2021-08-08 DIAGNOSIS — E109 Type 1 diabetes mellitus without complications: Secondary | ICD-10-CM | POA: Insufficient documentation

## 2021-08-08 DIAGNOSIS — D72829 Elevated white blood cell count, unspecified: Secondary | ICD-10-CM | POA: Insufficient documentation

## 2021-08-08 DIAGNOSIS — Z794 Long term (current) use of insulin: Secondary | ICD-10-CM | POA: Insufficient documentation

## 2021-08-08 DIAGNOSIS — E1065 Type 1 diabetes mellitus with hyperglycemia: Secondary | ICD-10-CM | POA: Insufficient documentation

## 2021-08-08 DIAGNOSIS — L02512 Cutaneous abscess of left hand: Secondary | ICD-10-CM | POA: Diagnosis not present

## 2021-08-08 LAB — CBC WITH DIFFERENTIAL/PLATELET
Abs Immature Granulocytes: 0.05 10*3/uL (ref 0.00–0.07)
Basophils Absolute: 0.1 10*3/uL (ref 0.0–0.1)
Basophils Relative: 1 %
Eosinophils Absolute: 0 10*3/uL (ref 0.0–0.5)
Eosinophils Relative: 0 %
HCT: 41.5 % (ref 39.0–52.0)
Hemoglobin: 14.7 g/dL (ref 13.0–17.0)
Immature Granulocytes: 0 %
Lymphocytes Relative: 4 %
Lymphs Abs: 0.6 10*3/uL — ABNORMAL LOW (ref 0.7–4.0)
MCH: 29.7 pg (ref 26.0–34.0)
MCHC: 35.4 g/dL (ref 30.0–36.0)
MCV: 83.8 fL (ref 80.0–100.0)
Monocytes Absolute: 0.8 10*3/uL (ref 0.1–1.0)
Monocytes Relative: 5 %
Neutro Abs: 13.8 10*3/uL — ABNORMAL HIGH (ref 1.7–7.7)
Neutrophils Relative %: 90 %
Platelets: 261 10*3/uL (ref 150–400)
RBC: 4.95 MIL/uL (ref 4.22–5.81)
RDW: 12.5 % (ref 11.5–15.5)
WBC: 15.4 10*3/uL — ABNORMAL HIGH (ref 4.0–10.5)
nRBC: 0 % (ref 0.0–0.2)

## 2021-08-08 LAB — CBG MONITORING, ED: Glucose-Capillary: 144 mg/dL — ABNORMAL HIGH (ref 70–99)

## 2021-08-08 LAB — BASIC METABOLIC PANEL
Anion gap: 10 (ref 5–15)
BUN: 15 mg/dL (ref 6–20)
CO2: 27 mmol/L (ref 22–32)
Calcium: 9.6 mg/dL (ref 8.9–10.3)
Chloride: 99 mmol/L (ref 98–111)
Creatinine, Ser: 0.96 mg/dL (ref 0.61–1.24)
GFR, Estimated: >60 mL/min (ref >60)
Glucose, Bld: 139 mg/dL — ABNORMAL HIGH (ref 70–99)
Potassium: 3.9 mmol/L (ref 3.5–5.1)
Sodium: 136 mmol/L (ref 135–145)

## 2021-08-08 MED ORDER — CEPHALEXIN 500 MG PO CAPS: 500.0000 mg | ORAL_CAPSULE | Freq: Four times a day (QID) | ORAL | 0 refills | Status: DC

## 2021-08-08 MED ORDER — SODIUM CHLORIDE 0.9 % IV SOLN
1.0000 g | Freq: Once | INTRAVENOUS | Status: AC
  Administered 2021-08-08: 1 g via INTRAVENOUS
  Filled 2021-08-08: qty 10

## 2021-08-08 MED ORDER — SODIUM CHLORIDE 0.9 % IV BOLUS
1000.0000 mL | Freq: Once | INTRAVENOUS | Status: AC
  Administered 2021-08-08: 1000 mL via INTRAVENOUS

## 2021-08-08 MED ORDER — NAPROXEN 500 MG PO TABS: 500.0000 mg | ORAL_TABLET | Freq: Two times a day (BID) | ORAL | 0 refills | Status: DC

## 2021-08-08 NOTE — ED Triage Notes (Signed)
Left hand swelling noted. Index finger swelilng with reddnes to proximal joint. Radial pulses wnl. Denies injury.

## 2021-08-11 ENCOUNTER — Emergency Department (HOSPITAL_COMMUNITY): Payer: BC Managed Care – PPO

## 2021-08-11 ENCOUNTER — Encounter (HOSPITAL_COMMUNITY): Payer: Self-pay | Admitting: Emergency Medicine

## 2021-08-11 ENCOUNTER — Inpatient Hospital Stay (HOSPITAL_COMMUNITY)
Admission: EM | Admit: 2021-08-11 | Discharge: 2021-08-15 | DRG: 603 | Disposition: A | Discharge status: Home / Self Care | Payer: BC Managed Care – PPO | Attending: Internal Medicine | Admitting: Internal Medicine

## 2021-08-11 ENCOUNTER — Other Ambulatory Visit: Payer: Self-pay

## 2021-08-11 DIAGNOSIS — Z8249 Family history of ischemic heart disease and other diseases of the circulatory system: Secondary | ICD-10-CM | POA: Diagnosis not present

## 2021-08-11 DIAGNOSIS — Z79899 Other long term (current) drug therapy: Secondary | ICD-10-CM

## 2021-08-11 DIAGNOSIS — M65142 Other infective (teno)synovitis, left hand: Secondary | ICD-10-CM | POA: Diagnosis present

## 2021-08-11 DIAGNOSIS — R000 Tachycardia, unspecified: Secondary | ICD-10-CM | POA: Diagnosis not present

## 2021-08-11 DIAGNOSIS — M7989 Other specified soft tissue disorders: Secondary | ICD-10-CM | POA: Diagnosis not present

## 2021-08-11 DIAGNOSIS — Z89021 Acquired absence of right finger(s): Secondary | ICD-10-CM | POA: Diagnosis not present

## 2021-08-11 DIAGNOSIS — E1065 Type 1 diabetes mellitus with hyperglycemia: Secondary | ICD-10-CM | POA: Diagnosis not present

## 2021-08-11 DIAGNOSIS — L03012 Cellulitis of left finger: Principal | ICD-10-CM | POA: Diagnosis present

## 2021-08-11 DIAGNOSIS — L03114 Cellulitis of left upper limb: Secondary | ICD-10-CM | POA: Diagnosis not present

## 2021-08-11 DIAGNOSIS — E109 Type 1 diabetes mellitus without complications: Secondary | ICD-10-CM | POA: Diagnosis not present

## 2021-08-11 DIAGNOSIS — Z9641 Presence of insulin pump (external) (internal): Secondary | ICD-10-CM | POA: Diagnosis not present

## 2021-08-11 DIAGNOSIS — F17290 Nicotine dependence, other tobacco product, uncomplicated: Secondary | ICD-10-CM | POA: Diagnosis not present

## 2021-08-11 DIAGNOSIS — E119 Type 2 diabetes mellitus without complications: Secondary | ICD-10-CM | POA: Diagnosis not present

## 2021-08-11 DIAGNOSIS — M65842 Other synovitis and tenosynovitis, left hand: Secondary | ICD-10-CM | POA: Diagnosis not present

## 2021-08-11 DIAGNOSIS — Z720 Tobacco use: Secondary | ICD-10-CM | POA: Diagnosis not present

## 2021-08-11 DIAGNOSIS — R7989 Other specified abnormal findings of blood chemistry: Secondary | ICD-10-CM | POA: Diagnosis not present

## 2021-08-11 DIAGNOSIS — E871 Hypo-osmolality and hyponatremia: Secondary | ICD-10-CM | POA: Diagnosis present

## 2021-08-11 DIAGNOSIS — E869 Volume depletion, unspecified: Secondary | ICD-10-CM | POA: Diagnosis not present

## 2021-08-11 DIAGNOSIS — E1100 Type 2 diabetes mellitus with hyperosmolarity without nonketotic hyperglycemic-hyperosmolar coma (NKHHC): Secondary | ICD-10-CM

## 2021-08-11 DIAGNOSIS — A419 Sepsis, unspecified organism: Secondary | ICD-10-CM | POA: Diagnosis not present

## 2021-08-11 DIAGNOSIS — D649 Anemia, unspecified: Secondary | ICD-10-CM | POA: Diagnosis not present

## 2021-08-11 HISTORY — DX: Bipolar II disorder: F31.81

## 2021-08-11 MED ORDER — VANCOMYCIN HCL 1500 MG/300ML IV SOLN
1500.0000 mg | Freq: Once | INTRAVENOUS | Status: AC
  Administered 2021-08-12: 1500 mg via INTRAVENOUS
  Filled 2021-08-11: qty 300

## 2021-08-11 NOTE — ED Provider Notes (Signed)
[facility] EMERGENCY DEPARTMENT Provider Note   CSN: [id number] Arrival date & time: 08/11/21  2254     History {Add pertinent medical, surgical, social history, OB history to HPI:1} No chief complaint on file.   Eric [name] is a 36 y.o. male.  Patient presents to the emergency department for evaluation of worsening skin infection.  Patient was seen on June 27 for infection involving the dorsal aspect of the proximal phalanx of the left second digit.  He was given Rocephin in the ED and discharged on Keflex.  Patient reports increased pain, swelling, redness spreading across the hand.       Home Medications Prior to Admission medications   Medication Sig Start Date End Date Taking? Authorizing Provider  albuterol (VENTOLIN HFA) 108 (90 Base) MCG/ACT inhaler Inhale 2 puffs into the lungs every 6 (six) hours as needed for wheezing or shortness of breath. Patient not taking: Reported on 02/22/2021 12/17/19   Graciella Freer A, PA-C  ARIPiprazole (ABILIFY) 2 MG tablet Take 2 mg by mouth daily. 07/05/21   [provider]  cephALEXin (KEFLEX) 500 MG capsule Take 1 capsule (500 mg total) by mouth 4 (four) times daily. 08/08/21   Eber Hong, MD  diazepam (VALIUM) 10 MG tablet Take 1 tablet (10 mg total) by mouth every 6 (six) hours as needed. Patient taking differently: Take 10 mg by mouth every 6 (six) hours as needed for anxiety. 07/07/20   Dione Booze, MD  escitalopram (LEXAPRO) 20 MG tablet Take 1 tablet by mouth daily. 04/05/20   [provider]  gabapentin (NEURONTIN) 300 MG capsule Take 1 capsule (300 mg total) by mouth 3 (three) times daily. Patient not taking: Reported on 07/10/2021 07/07/20   Burgess Amor, PA-C  hydrOXYzine (VISTARIL) 25 MG capsule Take 25 mg by mouth 3 (three) times daily. 07/05/21   [provider]  insulin lispro (HUMALOG) 100 UNIT/ML injection Inject 1-10 Units into the skin 3 (three) times daily before meals. Patient taking  differently: 1-10 Units 3 (three) times daily with meals. Based on sliding scale/carb intake-via pump 02/03/11   Elsaid, Hind I, MD  meloxicam (MOBIC) 7.5 MG tablet TAKE 1 TABLET(7.5 MG) BY MOUTH TWICE DAILY 07/27/21   Vickki Hearing, MD  naproxen (NAPROSYN) 500 MG tablet Take 1 tablet (500 mg total) by mouth 2 (two) times daily with a meal. 08/08/21   Eber Hong, MD  ondansetron (ZOFRAN) 4 MG tablet Take 1 tablet (4 mg total) by mouth every 6 (six) hours. 07/10/21   Eber Hong, MD      Allergies    Patient has no known allergies.    Review of Systems   Review of Systems  Physical Exam Updated Vital Signs BP (!) 143/90 (BP Location: Right Arm)   Pulse 100   Temp 98.8 F (37.1 C) (Oral)   Resp 16   Ht 5\' 9"  (1.753 m)   Wt 69.4 kg   SpO2 98%   BMI 22.59 kg/m  Physical Exam Vitals and nursing note reviewed.  Constitutional:      General: He is not in acute distress.    Appearance: He is well-developed.  HENT:     Head: Normocephalic and atraumatic.     Mouth/Throat:     Mouth: Mucous membranes are moist.  Eyes:     General: Vision grossly intact. Gaze aligned appropriately.     Extraocular Movements: Extraocular movements intact.     Conjunctiva/sclera: Conjunctivae normal.  Cardiovascular:  Rate and Rhythm: Normal rate and regular rhythm.     Pulses: Normal pulses.     Heart sounds: Normal heart sounds, S1 normal and S2 normal. No murmur heard.    No friction rub. No gallop.  Pulmonary:     Effort: Pulmonary effort is normal. No respiratory distress.     Breath sounds: Normal breath sounds.  Abdominal:     Palpations: Abdomen is soft.     Tenderness: There is no abdominal tenderness. There is no guarding or rebound.     Hernia: No hernia is present.  Musculoskeletal:        General: No swelling.     Left hand: Swelling and tenderness present.     Cervical back: Full passive range of motion without pain, normal range of motion and neck supple. No pain with  movement, spinous process tenderness or muscular tenderness. Normal range of motion.     Right lower leg: No edema.     Left lower leg: No edema.     Comments: Left second digit examination reveals evidence of dorsal skin infection with diffuse swelling.  No tenderness over flexor tendons, patient holding the finger slightly flexed because of dorsal pain but there is no flexor tendon pain with forced full extension.  Skin:    General: Skin is warm and dry.     Capillary Refill: Capillary refill takes less than 2 seconds.     Findings: No ecchymosis, erythema, lesion or wound.     Comments: Crusting and scabbing lesion over dorsal aspect of proximal phalanx of left second finger with associated diffuse swelling of the finger and dorsal aspect of the hand.  Neurological:     Mental Status: He is alert and oriented to person, place, and time.     GCS: GCS eye subscore is 4. GCS verbal subscore is 5. GCS motor subscore is 6.     Cranial Nerves: Cranial nerves 2-12 are intact.     Sensory: Sensation is intact.     Motor: Motor function is intact. No weakness or abnormal muscle tone.     Coordination: Coordination is intact.  Psychiatric:        Mood and Affect: Mood normal.        Speech: Speech normal.        Behavior: Behavior normal.     ED Results / Procedures / Treatments   Labs (all labs ordered are listed, but only abnormal results are displayed) Labs Reviewed  CULTURE, BLOOD (ROUTINE X 2)  CULTURE, BLOOD (ROUTINE X 2)  LACTIC ACID, PLASMA  LACTIC ACID, PLASMA  COMPREHENSIVE METABOLIC PANEL  CBC WITH DIFFERENTIAL/PLATELET  PROTIME-INR  APTT    EKG None  Radiology No results found.  Procedures Procedures  {Document cardiac monitor, telemetry assessment procedure when appropriate:1}  Medications Ordered in ED Medications - No data to display  ED Course/ Medical Decision Making/ A&P                           Medical Decision Making Amount and/or Complexity of Data  Reviewed Labs: ordered. Radiology: ordered. ECG/medicine tests: ordered.   ***  {Document critical care time when appropriate:1} {Document review of labs and clinical decision tools ie heart score, Chads2Vasc2 etc:1}  {Document your independent review of radiology images, and any outside records:1} {Document your discussion with family members, caretakers, and with consultants:1} {Document social determinants of health affecting pt's care:1} {Document your decision making why or why  not admission, treatments were needed:1} Final Clinical Impression(s) / ED Diagnoses Final diagnoses:  None    Rx / DC Orders ED Discharge Orders     None

## 2021-08-11 NOTE — ED Triage Notes (Signed)
Pt was here this past Tuesday for hand wound. Pt c/o progressive swelling and drainage to wound area. 2nd digit on left hand. Swelling, redness, and drainage noted. Redness radiates towards wrist. Pt states pain has intensified. Pt denies any fever

## 2021-08-12 ENCOUNTER — Other Ambulatory Visit: Payer: Self-pay

## 2021-08-12 ENCOUNTER — Inpatient Hospital Stay (HOSPITAL_COMMUNITY): Payer: BC Managed Care – PPO

## 2021-08-12 ENCOUNTER — Emergency Department (HOSPITAL_COMMUNITY): Payer: BC Managed Care – PPO

## 2021-08-12 DIAGNOSIS — Z720 Tobacco use: Secondary | ICD-10-CM

## 2021-08-12 DIAGNOSIS — E869 Volume depletion, unspecified: Secondary | ICD-10-CM | POA: Diagnosis present

## 2021-08-12 DIAGNOSIS — L03012 Cellulitis of left finger: Secondary | ICD-10-CM | POA: Diagnosis present

## 2021-08-12 DIAGNOSIS — D649 Anemia, unspecified: Secondary | ICD-10-CM | POA: Diagnosis present

## 2021-08-12 DIAGNOSIS — E109 Type 1 diabetes mellitus without complications: Secondary | ICD-10-CM | POA: Diagnosis not present

## 2021-08-12 DIAGNOSIS — E1100 Type 2 diabetes mellitus with hyperosmolarity without nonketotic hyperglycemic-hyperosmolar coma (NKHHC): Secondary | ICD-10-CM

## 2021-08-12 DIAGNOSIS — E1065 Type 1 diabetes mellitus with hyperglycemia: Secondary | ICD-10-CM

## 2021-08-12 DIAGNOSIS — Z79899 Other long term (current) drug therapy: Secondary | ICD-10-CM | POA: Diagnosis not present

## 2021-08-12 DIAGNOSIS — E871 Hypo-osmolality and hyponatremia: Secondary | ICD-10-CM | POA: Diagnosis present

## 2021-08-12 DIAGNOSIS — Z89021 Acquired absence of right finger(s): Secondary | ICD-10-CM | POA: Diagnosis not present

## 2021-08-12 DIAGNOSIS — F17290 Nicotine dependence, other tobacco product, uncomplicated: Secondary | ICD-10-CM | POA: Diagnosis present

## 2021-08-12 DIAGNOSIS — Z8249 Family history of ischemic heart disease and other diseases of the circulatory system: Secondary | ICD-10-CM | POA: Diagnosis not present

## 2021-08-12 DIAGNOSIS — M65142 Other infective (teno)synovitis, left hand: Secondary | ICD-10-CM | POA: Diagnosis present

## 2021-08-12 DIAGNOSIS — M65842 Other synovitis and tenosynovitis, left hand: Secondary | ICD-10-CM

## 2021-08-12 DIAGNOSIS — Z9641 Presence of insulin pump (external) (internal): Secondary | ICD-10-CM | POA: Diagnosis present

## 2021-08-12 DIAGNOSIS — R7989 Other specified abnormal findings of blood chemistry: Secondary | ICD-10-CM

## 2021-08-12 LAB — GLUCOSE, CAPILLARY
Glucose-Capillary: 175 mg/dL — ABNORMAL HIGH (ref 70–99)
Glucose-Capillary: 342 mg/dL — ABNORMAL HIGH (ref 70–99)
Glucose-Capillary: 254 mg/dL — ABNORMAL HIGH (ref 70–99)
Glucose-Capillary: 375 mg/dL — ABNORMAL HIGH (ref 70–99)
Glucose-Capillary: 310 mg/dL — ABNORMAL HIGH (ref 70–99)

## 2021-08-12 LAB — CBC WITH DIFFERENTIAL/PLATELET
Abs Immature Granulocytes: 0.02 10*3/uL (ref 0.00–0.07)
Basophils Absolute: 0 10*3/uL (ref 0.0–0.1)
Basophils Relative: 0 %
Eosinophils Absolute: 0.1 10*3/uL (ref 0.0–0.5)
Eosinophils Relative: 2 %
HCT: 38.4 % — ABNORMAL LOW (ref 39.0–52.0)
Hemoglobin: 13.6 g/dL (ref 13.0–17.0)
Immature Granulocytes: 0 %
Lymphocytes Relative: 9 %
Lymphs Abs: 0.9 10*3/uL (ref 0.7–4.0)
MCH: 29.2 pg (ref 26.0–34.0)
MCHC: 35.4 g/dL (ref 30.0–36.0)
MCV: 82.4 fL (ref 80.0–100.0)
Monocytes Absolute: 0.6 10*3/uL (ref 0.1–1.0)
Monocytes Relative: 6 %
Neutro Abs: 7.7 10*3/uL (ref 1.7–7.7)
Neutrophils Relative %: 83 %
Platelets: 225 10*3/uL (ref 150–400)
RBC: 4.66 MIL/uL (ref 4.22–5.81)
RDW: 12 % (ref 11.5–15.5)
WBC: 9.4 10*3/uL (ref 4.0–10.5)
nRBC: 0 % (ref 0.0–0.2)

## 2021-08-12 LAB — BASIC METABOLIC PANEL
Anion gap: 9 (ref 5–15)
BUN: 18 mg/dL (ref 6–20)
CO2: 27 mmol/L (ref 22–32)
Calcium: 8.9 mg/dL (ref 8.9–10.3)
Chloride: 98 mmol/L (ref 98–111)
Creatinine, Ser: 0.96 mg/dL (ref 0.61–1.24)
GFR, Estimated: >60 mL/min (ref >60)
Glucose, Bld: 310 mg/dL — ABNORMAL HIGH (ref 70–99)
Potassium: 3.6 mmol/L (ref 3.5–5.1)
Sodium: 134 mmol/L — ABNORMAL LOW (ref 135–145)

## 2021-08-12 LAB — COMPREHENSIVE METABOLIC PANEL
ALT: 17 U/L (ref 0–44)
AST: 10 U/L — ABNORMAL LOW (ref 15–41)
Albumin: 4 g/dL (ref 3.5–5.0)
Alkaline Phosphatase: 105 U/L (ref 38–126)
Anion gap: 8 (ref 5–15)
BUN: 19 mg/dL (ref 6–20)
CO2: 28 mmol/L (ref 22–32)
Calcium: 9 mg/dL (ref 8.9–10.3)
Chloride: 93 mmol/L — ABNORMAL LOW (ref 98–111)
Creatinine, Ser: 1.09 mg/dL (ref 0.61–1.24)
GFR, Estimated: >60 mL/min (ref >60)
Glucose, Bld: 688 mg/dL (ref 70–99)
Potassium: 4.4 mmol/L (ref 3.5–5.1)
Sodium: 129 mmol/L — ABNORMAL LOW (ref 135–145)
Total Bilirubin: 1 mg/dL (ref 0.3–1.2)
Total Protein: 7.6 g/dL (ref 6.5–8.1)

## 2021-08-12 LAB — LACTIC ACID, PLASMA
Lactic Acid, Venous: 0.8 mmol/L (ref 0.5–1.9)
Lactic Acid, Venous: 1.3 mmol/L (ref 0.5–1.9)

## 2021-08-12 LAB — CBC
HCT: 36.2 % — ABNORMAL LOW (ref 39.0–52.0)
Hemoglobin: 12.8 g/dL — ABNORMAL LOW (ref 13.0–17.0)
MCH: 29.3 pg (ref 26.0–34.0)
MCHC: 35.4 g/dL (ref 30.0–36.0)
MCV: 82.8 fL (ref 80.0–100.0)
Platelets: 244 10*3/uL (ref 150–400)
RBC: 4.37 MIL/uL (ref 4.22–5.81)
RDW: 12 % (ref 11.5–15.5)
WBC: 10.9 10*3/uL — ABNORMAL HIGH (ref 4.0–10.5)
nRBC: 0 % (ref 0.0–0.2)

## 2021-08-12 LAB — SEDIMENTATION RATE: Sed Rate: 58 mm/hr — ABNORMAL HIGH (ref 0–16)

## 2021-08-12 LAB — HIV ANTIBODY (ROUTINE TESTING W REFLEX): HIV Screen 4th Generation wRfx: NONREACTIVE

## 2021-08-12 LAB — PROTIME-INR
INR: 0.9 (ref 0.8–1.2)
Prothrombin Time: 12.3 seconds (ref 11.4–15.2)

## 2021-08-12 LAB — CBG MONITORING, ED: Glucose-Capillary: 254 mg/dL — ABNORMAL HIGH (ref 70–99)

## 2021-08-12 LAB — PHOSPHORUS: Phosphorus: 2.8 mg/dL (ref 2.5–4.6)

## 2021-08-12 LAB — MAGNESIUM: Magnesium: 2.2 mg/dL (ref 1.7–2.4)

## 2021-08-12 LAB — APTT: aPTT: 32 seconds (ref 24–36)

## 2021-08-12 LAB — C-REACTIVE PROTEIN: CRP: 13.7 mg/dL — ABNORMAL HIGH (ref <1.0)

## 2021-08-12 MED ORDER — INSULIN REGULAR(HUMAN) IN NACL 100-0.9 UT/100ML-% IV SOLN: INTRAVENOUS | Status: DC

## 2021-08-12 MED ORDER — HYDROMORPHONE HCL 1 MG/ML IJ SOLN
1.0000 mg | Freq: Once | INTRAMUSCULAR | Status: AC
  Administered 2021-08-12: 1 mg via INTRAVENOUS
  Filled 2021-08-12: qty 1

## 2021-08-12 MED ORDER — ONDANSETRON HCL 4 MG/2ML IJ SOLN: 4.0000 mg | Freq: Four times a day (QID) | INTRAMUSCULAR | Status: DC | PRN

## 2021-08-12 MED ORDER — SODIUM CHLORIDE 0.9 % IV SOLN
2.0000 g | Freq: Three times a day (TID) | INTRAVENOUS | Status: AC
  Administered 2021-08-12 – 2021-08-15 (×10): 2 g via INTRAVENOUS
  Filled 2021-08-12 (×10): qty 12.5

## 2021-08-12 MED ORDER — CEFEPIME HCL 1 G IJ SOLR
1.0000 g | Freq: Three times a day (TID) | INTRAMUSCULAR | Status: DC
Start: 2021-08-12 — End: 2021-08-12

## 2021-08-12 MED ORDER — VANCOMYCIN HCL IN DEXTROSE 1-5 GM/200ML-% IV SOLN: 1000.0000 mg | Freq: Once | INTRAVENOUS | Status: DC

## 2021-08-12 MED ORDER — ENOXAPARIN SODIUM 40 MG/0.4ML IJ SOSY
40.0000 mg | PREFILLED_SYRINGE | INTRAMUSCULAR | Status: DC
  Administered 2021-08-12 – 2021-08-15 (×4): 40 mg via SUBCUTANEOUS
  Filled 2021-08-12 (×4): qty 0.4

## 2021-08-12 MED ORDER — LACTATED RINGERS IV SOLN: INTRAVENOUS | Status: DC

## 2021-08-12 MED ORDER — ONDANSETRON HCL 4 MG/2ML IJ SOLN
4.0000 mg | Freq: Once | INTRAMUSCULAR | Status: AC
Start: 2021-08-12 — End: 2021-08-12
  Administered 2021-08-12: 4 mg via INTRAVENOUS
  Filled 2021-08-12: qty 2

## 2021-08-12 MED ORDER — INSULIN PUMP
Freq: Three times a day (TID) | SUBCUTANEOUS | Status: DC
Start: 2021-08-12 — End: 2021-08-15
  Filled 2021-08-12: qty 1

## 2021-08-12 MED ORDER — ACETAMINOPHEN 325 MG PO TABS: 650.0000 mg | ORAL_TABLET | Freq: Four times a day (QID) | ORAL | Status: DC | PRN

## 2021-08-12 MED ORDER — DEXTROSE IN LACTATED RINGERS 5 % IV SOLN: INTRAVENOUS | Status: DC

## 2021-08-12 MED ORDER — IOHEXOL 300 MG/ML  SOLN
100.0000 mL | Freq: Once | INTRAMUSCULAR | Status: AC | PRN
  Administered 2021-08-12: 100 mL via INTRAVENOUS

## 2021-08-12 MED ORDER — DEXTROSE 50 % IV SOLN: 0.0000 mL | INTRAVENOUS | Status: DC | PRN

## 2021-08-12 MED ORDER — HYDROMORPHONE HCL 1 MG/ML IJ SOLN
0.5000 mg | INTRAMUSCULAR | Status: DC | PRN
  Administered 2021-08-12 – 2021-08-15 (×17): 0.5 mg via INTRAVENOUS
  Filled 2021-08-12 (×17): qty 0.5

## 2021-08-12 MED ORDER — VANCOMYCIN HCL IN DEXTROSE 1-5 GM/200ML-% IV SOLN
1000.0000 mg | Freq: Two times a day (BID) | INTRAVENOUS | Status: AC
  Administered 2021-08-12 – 2021-08-15 (×7): 1000 mg via INTRAVENOUS
  Filled 2021-08-12 (×7): qty 200

## 2021-08-12 NOTE — Assessment & Plan Note (Signed)
Secondary to volume depletion and hyperglycemia Continue IV fluids Improve glycemic control improved

## 2021-08-12 NOTE — ED Notes (Signed)
Pt adjusted self insulin pump based on CBG 254

## 2021-08-12 NOTE — ED Notes (Signed)
Pt adjusted personal insulin pump based on blood glucose results. EDP made aware

## 2021-08-12 NOTE — Progress Notes (Signed)
CT Left Hand on 08/12/21 1. Soft tissue swelling in the radial aspect of the hand extending into the index finger, consistent with soft tissue infection 2. No focal fluid collection, foreign body or soft tissue emphysema identified. 3. No evidence of osteomyelitis or septic arthritis by CT.

## 2021-08-12 NOTE — H&P (Signed)
History and Physical    Patient: Eric [name] UJW:[id number] DOB: 1985-03-14 DOA: 08/11/2021 DOS: the patient was seen and examined on 08/12/2021 PCP: Roe Rutherford, NP  Patient coming from: Home  Chief Complaint: No chief complaint on file.  HPI: Eric [name] is a 36 y.o. male with medical history significant of T1DM and tobacco use who presents to the emergency department due to worsening left hand skin infection.  Patient states that he presented to the ED on June 27 due to left second digit redness and inflammation, he was treated with IV Rocephin and was discharged with Keflex and naproxen, he was told to return to ED for worsening skin infection.  He complained of increased redness, swelling, pain and extension of the red rash across hand.  He was able to flex and extend the fingers, though with some pain.  Patient denies fever, chills, chest pain, shortness of breath, vomiting, diarrhea or constipation.  ED Course:  In the emergency department, he was intermittently tachypneic and tachycardic, BP 140/90 and other vital signs are within normal range.  Work-up in the ED showed normocytic anemia, hyponatremia, hyperglycemia, magnesium 2.2, phosphorus 2.8. Left hand x-ray showed swelling in the second digit without acute bony abnormality Chest x-ray showed no active disease IV Dilaudid, Zofran and vancomycin were given.  Patient adjusted his insulin pump with improvement in blood glucose of 254.  Hospitalist was asked to admit patient for further evaluation and management.  Review of Systems: Review of systems as noted in the HPI. All other systems reviewed and are negative.   Past Medical History:  Diagnosis Date   Anxiety    Bipolar 2 disorder, major depressive episode (HCC)    Depression    Diabetes mellitus    Hypertension    Past Surgical History:  Procedure Laterality Date   AMPUTATION Right    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   FRACTURE SURGERY     reconstructive  left knee surgery   KNEE SURGERY      Social History:  reports that he has never smoked. His smokeless tobacco use includes snuff. He reports current alcohol use. He reports that he does not use drugs.   No Known Allergies  Family History  Problem Relation Age of Onset   Hypertension Mother    Hypertension Father      Prior to Admission medications   Medication Sig Start Date End Date Taking? Authorizing Provider  albuterol (VENTOLIN HFA) 108 (90 Base) MCG/ACT inhaler Inhale 2 puffs into the lungs every 6 (six) hours as needed for wheezing or shortness of breath. Patient not taking: Reported on 02/22/2021 12/17/19   Graciella Freer A, PA-C  ARIPiprazole (ABILIFY) 2 MG tablet Take 2 mg by mouth daily. 07/05/21   [provider]  cephALEXin (KEFLEX) 500 MG capsule Take 1 capsule (500 mg total) by mouth 4 (four) times daily. 08/08/21   Eber Hong, MD  diazepam (VALIUM) 10 MG tablet Take 1 tablet (10 mg total) by mouth every 6 (six) hours as needed. Patient taking differently: Take 10 mg by mouth every 6 (six) hours as needed for anxiety. 07/07/20   Dione Booze, MD  escitalopram (LEXAPRO) 20 MG tablet Take 1 tablet by mouth daily. 04/05/20   [provider]  gabapentin (NEURONTIN) 300 MG capsule Take 1 capsule (300 mg total) by mouth 3 (three) times daily. Patient not taking: Reported on 07/10/2021 07/07/20   Burgess Amor, PA-C  hydrOXYzine (VISTARIL) 25 MG capsule Take 25 mg  by mouth 3 (three) times daily. 07/05/21   [provider]  insulin lispro (HUMALOG) 100 UNIT/ML injection Inject 1-10 Units into the skin 3 (three) times daily before meals. Patient taking differently: 1-10 Units 3 (three) times daily with meals. Based on sliding scale/carb intake-via pump 02/03/11   Elsaid, Hind I, MD  meloxicam (MOBIC) 7.5 MG tablet TAKE 1 TABLET(7.5 MG) BY MOUTH TWICE DAILY 07/27/21   Vickki Hearing, MD  naproxen (NAPROSYN) 500 MG tablet Take 1 tablet (500 mg total) by  mouth 2 (two) times daily with a meal. 08/08/21   Eber Hong, MD  ondansetron (ZOFRAN) 4 MG tablet Take 1 tablet (4 mg total) by mouth every 6 (six) hours. 07/10/21   Eber Hong, MD    Physical Exam: BP (!) 129/92   Pulse 99   Temp 98.8 F (37.1 C) (Oral)   Resp 18   Ht 5\' 9"  (1.753 m)   Wt 69.4 kg   SpO2 99%   BMI 22.59 kg/m   General: 36 y.o. year-old male well developed well nourished in no acute distress.  Alert and oriented x3. HEENT: NCAT, EOMI Neck: Supple, trachea medial Cardiovascular: Regular rate and rhythm with no rubs or gallops.  No thyromegaly or JVD noted.  No lower extremity edema. 2/4 pulses in all 4 extremities. Respiratory: Clear to auscultation with no wheezes or rales. Good inspiratory effort. Abdomen: Soft, nontender nondistended with normal bowel sounds x4 quadrants. Muskuloskeletal: Erythema, swelling and tenderness of left index finger.  Amputation of right second, third and fourth digits. Neuro: CN II-XII intact, sensation, reflexes intact Skin: Psoriatic rash was noted in different parts of the body.  No ulcerative lesions noted  Psychiatry: Judgement and insight appear normal. Mood is appropriate for condition and setting          Labs on Admission:  Basic Metabolic Panel: Recent Labs  Lab 08/08/21 1635 08/12/21 0023 08/12/21 0028  NA 136 129*  --   K 3.9 4.4  --   CL 99 93*  --   CO2 27 28  --   GLUCOSE 139* 688*  --   BUN 15 19  --   CREATININE 0.96 1.09  --   CALCIUM 9.6 9.0  --   MG  --   --  2.2  PHOS  --   --  2.8   Liver Function Tests: Recent Labs  Lab 08/12/21 0023  AST 10*  ALT 17  ALKPHOS 105  BILITOT 1.0  PROT 7.6  ALBUMIN 4.0   No results for input(s): "LIPASE", "AMYLASE" in the last 168 hours. No results for input(s): "AMMONIA" in the last 168 hours. CBC: Recent Labs  Lab 08/08/21 1635 08/12/21 0023  WBC 15.4* 9.4  NEUTROABS 13.8* 7.7  HGB 14.7 13.6  HCT 41.5 38.4*  MCV 83.8 82.4  PLT 261 225    Cardiac Enzymes: No results for input(s): "CKTOTAL", "CKMB", "CKMBINDEX", "TROPONINI" in the last 168 hours.  BNP (last 3 results) No results for input(s): "BNP" in the last 8760 hours.  ProBNP (last 3 results) No results for input(s): "PROBNP" in the last 8760 hours.  CBG: Recent Labs  Lab 08/08/21 1622 08/12/21 0239  GLUCAP 144* 254*    Radiological Exams on Admission: DG Hand Complete Left  Result Date: 08/12/2021 CLINICAL DATA:  Soft tissue infection EXAM: LEFT HAND - COMPLETE 3+ VIEW COMPARISON:  None Available. FINDINGS: Considerable soft tissue swelling is noted involving primarily the second digit consistent with the given clinical  history. Underlying bony structures are within normal limits. No radiopaque foreign body is noted. IMPRESSION: Swelling in the second digit without acute bony abnormality. Electronically Signed   By: Alcide Clever M.D.   On: 08/12/2021 01:04   DG Chest Port 1 View  Result Date: 08/12/2021 CLINICAL DATA:  Sepsis EXAM: PORTABLE CHEST 1 VIEW COMPARISON:  01/03/2021 FINDINGS: The heart size and mediastinal contours are within normal limits. Both lungs are clear. The visualized skeletal structures are unremarkable. IMPRESSION: No active disease. Electronically Signed   By: Alcide Clever M.D.   On: 08/12/2021 00:11    EKG: I independently viewed the EKG done and my findings are as followed: Sinus tachycardia at rate of 100 bpm  Assessment/Plan Present on Admission:  Cellulitis of finger of left hand  Type 1 diabetes mellitus on insulin therapy (HCC)  Principal Problem:   Cellulitis of finger of left hand Active Problems:   Type 1 diabetes mellitus on insulin therapy (HCC)   Pseudohyponatremia   Tobacco use  Cellulitis of finger of left hand Patient was started with IV vancomycin, he will continue with same at this time Continue Tylenol as needed Continue IV Dilaudid 0.5 mg every 3 hours as needed for moderate/severe pain Continue Zofran as  needed for nausea/vomiting Continue PT/OT eval and treat Blood culture pending  Pseudohyponatremia Na 129; corrected sodium level based on hyperglycemia (688) is 138 Continue to monitor sodium level  T1DM with hyperglycemia Blood glucose was 688, patient adjusted his insulin pump with current CBG at 254 Continue to monitor CBG levels  Tobacco use Patient dips tobacco, he was counseled on tobacco use cessation  DVT prophylaxis: Lovenox  Code Status: Full code  Consults: None  Family Communication: Wife at bedside (all questions answered to satisfaction)  Severity of Illness: The appropriate patient status for this patient is INPATIENT. Inpatient status is judged to be reasonable and necessary in order to provide the required intensity of service to ensure the patient's safety. The patient's presenting symptoms, physical exam findings, and initial radiographic and laboratory data in the context of their chronic comorbidities is felt to place them at high risk for further clinical deterioration. Furthermore, it is not anticipated that the patient will be medically stable for discharge from the hospital within 2 midnights of admission.   * I certify that at the point of admission it is my clinical judgment that the patient will require inpatient hospital care spanning beyond 2 midnights from the point of admission due to high intensity of service, high risk for further deterioration and high frequency of surveillance required.*  Author: Frankey Shown, DO 08/12/2021 3:57 AM  For on call review [email or web address].

## 2021-08-12 NOTE — ED Notes (Signed)
Date and time results received: 08/12/21 0117 (use smartphrase ".now" to insert current time)  Test: glucose Critical Value: 688  Name of Provider Notified: Earna Coder, MD

## 2021-08-12 NOTE — Assessment & Plan Note (Signed)
Cessation discussed 

## 2021-08-12 NOTE — Assessment & Plan Note (Signed)
Treatment plan as discussed above.  

## 2021-08-12 NOTE — Hospital Course (Signed)
36 year old male with a history of type 1 diabetes mellitus, anxiety/depression presenting with worsening left hand infection.  The patient states that he initially had 2 boils on the dorsum of his left index finger around the PIP joint.  He had his spouse palpable is with her fingernails.  He woke up on the morning of 08/08/2021 with increasing erythema, edema, and pain.  He went to the emergency department where he was given an injection of ceftriaxone.  He was discharged home with cephalexin and Naprosyn.  He continued to have worsening of his left hand with increasing erythema, edema, and pain spreading proximally.  He denied any fevers, chills, chest pain, shortness of breath, vomiting, diarrhea, abdominal pain.  On the evening of 08/10/2021, he stated that he had his spouse tried to " "lance the infection" with a home knife.  He stated that only drained clear fluid.  Because his hand did not improve, he presented for further evaluation and treatment. In the ED, the patient was afebrile hemodynamically stable with oxygen saturation 99% room air.  WBC 9.4, hemoglobin 13.6, platelets 225,000.  LFTs were unremarkable.  Sodium 129, potassium 4.4, serum creatinine 1.09.  X-ray of the left hand was negative for any foreign body or osteomyelitis.  There is soft tissue edema.  Chest x-ray was negative.  The patient was started vancomycin.

## 2021-08-12 NOTE — ED Notes (Signed)
Provider at bedside

## 2021-08-12 NOTE — Assessment & Plan Note (Signed)
07/05/2021 hemoglobin A1c 10.5 Patient endorses some dietary indiscretion Start insulin pump protocol while inpatient -after extensive discussion, patient will increase his basal rate on his insulin pump by 25 % in 24 hour period -he will arrange endocrine follow withing 2-3 weeks after d/c

## 2021-08-12 NOTE — Progress Notes (Signed)
Pharmacy Antibiotic Note  Eric [name] is a 36 y.o. male admitted on 08/11/2021 with cellulitis.  Pharmacy has been consulted for Vancomycin  dosing.  Plan: Vancomycin 1000 mg IV q12h  Height: 5\' 9"  (175.3 cm) Weight: 69.4 kg (153 lb) IBW/kg (Calculated) : 70.7  Temp (24hrs), Avg:98.8 F (37.1 C), Min:98.8 F (37.1 C), Max:98.8 F (37.1 C)  Recent Labs  Lab 08/08/21 1635 08/12/21 0023  WBC 15.4* 9.4  CREATININE 0.96 1.09  LATICACIDVEN  --  1.3    Estimated Creatinine Clearance: 91.1 mL/min (by C-G formula based on SCr of 1.09 mg/dL).    No Known Allergies   10/13/21 08/12/2021 3:38 AM

## 2021-08-12 NOTE — Progress Notes (Signed)
PROGRESS NOTE  TRUEMAN WORLDS Eric [name]:[id number] DOB: March 23, 1985 DOA: 08/11/2021 PCP: Pablo Lawrence, NP  Brief History:  36 year old male with a history of type 1 diabetes mellitus, anxiety/depression presenting with worsening left hand infection.  The patient states that he initially had 2 boils on the dorsum of his left index finger around the PIP joint.  He had his spouse palpable is with her fingernails.  He woke up on the morning of 08/08/2021 with increasing erythema, edema, and pain.  He went to the emergency department where he was given an injection of ceftriaxone.  He was discharged home with cephalexin and Naprosyn.  He continued to have worsening of his left hand with increasing erythema, edema, and pain spreading proximally.  He denied any fevers, chills, chest pain, shortness of breath, vomiting, diarrhea, abdominal pain.  On the evening of 08/10/2021, he stated that he had his spouse tried to " "lance the infection" with a home knife.  He stated that only drained clear fluid.  Because his hand did not improve, he presented for further evaluation and treatment. In the ED, the patient was afebrile hemodynamically stable with oxygen saturation 99% room air.  WBC 9.4, hemoglobin 13.6, platelets 225,000.  LFTs were unremarkable.  Sodium 129, potassium 4.4, serum creatinine 1.09.  X-ray of the left hand was negative for any foreign body or osteomyelitis.  There is soft tissue edema.  Chest x-ray was negative.  The patient was started vancomycin.     Assessment and Plan: * Cellulitis of finger of left hand Patient failed outpatient antibiotics Continue IV vancomycin Add cefepime CT left hand ESR CRP  Other synovitis and tenosynovitis, left hand Treatment plan as discussed above  Hyperosmolar non-ketotic state due to type 2 diabetes mellitus [facility]) Patient presented with serum glucose 688, normal anion gap Improved on fluid resuscitation and bolusing with his insulin  pump Continue IV fluids  Uncontrolled type 1 diabetes mellitus with hyperglycemia, with long-term current use of insulin ([place]) 07/05/2021 hemoglobin A1c 10.5 Patient endorses some dietary indiscretion Start insulin pump protocol while inpatient  Hyponatremia Secondary to volume depletion and hyperglycemia Continue IV fluids Improve glycemic control  Tobacco use Cessation discussed      Family Communication:   spouse at bedside 7/1  Consultants:   none  Code Status:  FULL   DVT Prophylaxis:  [place] Lovenox   Procedures: As Listed in Progress Note Above  Antibiotics: Vanc 7/1>> Cefepime 7/1>>     Subjective: Patient complains of pain in his left hand.  He has some nausea.  Denies any fevers, chills, chest pain, shortness breath, vomiting, diarrhea, abdominal pain.  There is no dysuria or hematuria.  Objective: Vitals:   08/12/21 0200 08/12/21 0330 08/12/21 0349 08/12/21 0418  BP: (!) 149/92 (!) 129/92  132/87  Pulse: (!) 101 99  90  Resp: (!) _0 Temp:   98.8 F (37.1 C) 98.3 F (36.8 C)  TempSrc:   Oral Oral  SpO2: 99% 99%  97%  Weight:    67.8 kg  Height:    _1  (1.753 m)    Intake/Output Summary (Last 24 hours) at 08/12/2021 8119 Last data filed at 08/12/2021 0245 Gross per 24 hour  Intake 297.14 ml  Output --  Net 297.14 ml   Weight change:  Exam:  General:  Pt is alert, follows commands appropriately, not in acute distress HEENT: No icterus, No thrush, No neck mass, [place]/AT  Cardiovascular: RRR, S1/S2, no rubs, no gallops Respiratory: CTA bilaterally, no wheezing, no crackles, no rhonchi Abdomen: Soft/+BS, non tender, non distended, no guarding Extremities: left hand second digit erythema and edema from PIP to mid MCP on dorsum   Data Reviewed: I have personally reviewed following labs and imaging studies Basic Metabolic Panel: Recent Labs  Lab 08/08/21 1635 08/12/21 0023 08/12/21 0028  NA 136 129*  --   K 3.9 4.4  --   CL 99 93*   --   CO2 27 28  --   GLUCOSE 139* 688*  --   BUN 15 19  --   CREATININE 0.96 1.09  --   CALCIUM 9.6 9.0  --   MG  --   --  2.2  PHOS  --   --  2.8   Liver Function Tests: Recent Labs  Lab 08/12/21 0023  AST 10*  ALT 17  ALKPHOS 105  BILITOT 1.0  PROT 7.6  ALBUMIN 4.0   No results for input(s): "LIPASE", "AMYLASE" in the last 168 hours. No results for input(s): "AMMONIA" in the last 168 hours. Coagulation Profile: Recent Labs  Lab 08/12/21 0023  INR 0.9   CBC: Recent Labs  Lab 08/08/21 1635 08/12/21 0023  WBC 15.4* 9.4  NEUTROABS 13.8* 7.7  HGB 14.7 13.6  HCT 41.5 38.4*  MCV 83.8 82.4  PLT 261 225   Cardiac Enzymes: No results for input(s): "CKTOTAL", "CKMB", "CKMBINDEX", "TROPONINI" in the last 168 hours. BNP: Invalid input(s): "POCBNP" CBG: Recent Labs  Lab 08/08/21 1622 08/12/21 0239 08/12/21 0432  GLUCAP 144* 254* 175*   HbA1C: No results for input(s): "HGBA1C" in the last 72 hours. Urine analysis:    Component Value Date/Time   COLORURINE YELLOW 07/10/2021 1150   APPEARANCEUR CLEAR 07/10/2021 1150   LABSPEC 1.029 07/10/2021 1150   PHURINE 5.0 07/10/2021 1150   GLUCOSEU >=500 (A) 07/10/2021 1150   HGBUR NEGATIVE 07/10/2021 1150   BILIRUBINUR NEGATIVE 07/10/2021 1150   KETONESUR 20 (A) 07/10/2021 1150   PROTEINUR 30 (A) 07/10/2021 1150   UROBILINOGEN 0.2 02/15/2013 1533   NITRITE NEGATIVE 07/10/2021 [place] 07/10/2021 1150   Sepsis Labs: $[email or web address]'[id number]$ (procalcitonin:4,lacticidven:4) ) Recent Results (from the past 240 hour(s))  Blood Culture (routine x 2)     Status: None (Preliminary result)   Collection Time: 08/12/21 12:17 AM   Specimen: Left Antecubital; Blood  Result Value Ref Range Status   Specimen Description   Final    LEFT ANTECUBITAL BOTTLES DRAWN AEROBIC AND ANAEROBIC   Special Requests Blood Culture adequate volume  Final   Culture   Final    NO GROWTH <12 HOURS Performed at [facility], [address], [place], [place] [address]    Report Status PENDING  Incomplete  Blood Culture (routine x 2)     Status: None (Preliminary result)   Collection Time: 08/12/21 12:23 AM   Specimen: Right Antecubital; Blood  Result Value Ref Range Status   Specimen Description   Final    RIGHT ANTECUBITAL BOTTLES DRAWN AEROBIC AND ANAEROBIC   Special Requests Blood Culture adequate volume  Final   Culture   Final    NO GROWTH <12 HOURS Performed at [facility], [address], [place], [place] [address]    Report Status PENDING  Incomplete     Scheduled Meds:  enoxaparin (LOVENOX) injection  40 mg Subcutaneous Q24H   insulin pump   Subcutaneous TID WC, HS, 0200   Continuous Infusions:  lactated ringers     vancomycin      Procedures/Studies: DG Hand Complete Left  Result Date: 08/12/2021 CLINICAL DATA:  Soft tissue infection EXAM: LEFT HAND - COMPLETE 3+ VIEW COMPARISON:  None Available. FINDINGS: Considerable soft tissue swelling is noted involving primarily the second digit consistent with the given clinical history. Underlying bony structures are within normal limits. No radiopaque foreign body is noted. IMPRESSION: Swelling in the second digit without acute bony abnormality. Electronically Signed   By: Inez Catalina M.D.   On: 08/12/2021 01:04   DG Chest Port 1 View  Result Date: 08/12/2021 CLINICAL DATA:  Sepsis EXAM: PORTABLE CHEST 1 VIEW COMPARISON:  01/03/2021 FINDINGS: The heart size and mediastinal contours are within normal limits. Both lungs are clear. The visualized skeletal structures are unremarkable. IMPRESSION: No active disease. Electronically Signed   By: Inez Catalina M.D.   On: 08/12/2021 00:11    Eric Eva, DO  [facility]  If 7PM-7AM, please contact night-coverage [email or web address] Password [email or web address] 08/12/2021, 7:33 AM   LOS: 0 days

## 2021-08-12 NOTE — Assessment & Plan Note (Signed)
Patient presented with serum glucose 688, normal anion gap Improved on fluid resuscitation and bolusing with his insulin pump Continue IV fluids

## 2021-08-12 NOTE — Assessment & Plan Note (Signed)
Patient failed outpatient antibiotics Continue IV vancomycin continue cefepime CT left hand--no abscess, no FB, no septic arthritis ESR--58 CRP--13.7>>5.2 D/c home with amox/clav and doxy x 7 more days 

## 2021-08-13 DIAGNOSIS — L03012 Cellulitis of left finger: Secondary | ICD-10-CM | POA: Diagnosis not present

## 2021-08-13 DIAGNOSIS — M65842 Other synovitis and tenosynovitis, left hand: Secondary | ICD-10-CM | POA: Diagnosis not present

## 2021-08-13 DIAGNOSIS — E1100 Type 2 diabetes mellitus with hyperosmolarity without nonketotic hyperglycemic-hyperosmolar coma (NKHHC): Secondary | ICD-10-CM | POA: Diagnosis not present

## 2021-08-13 DIAGNOSIS — E871 Hypo-osmolality and hyponatremia: Secondary | ICD-10-CM

## 2021-08-13 LAB — COMPREHENSIVE METABOLIC PANEL
ALT: 14 U/L (ref 0–44)
AST: 8 U/L — ABNORMAL LOW (ref 15–41)
Albumin: 3.2 g/dL — ABNORMAL LOW (ref 3.5–5.0)
Alkaline Phosphatase: 82 U/L (ref 38–126)
Anion gap: 7 (ref 5–15)
BUN: 12 mg/dL (ref 6–20)
CO2: 31 mmol/L (ref 22–32)
Calcium: 9 mg/dL (ref 8.9–10.3)
Chloride: 96 mmol/L — ABNORMAL LOW (ref 98–111)
Creatinine, Ser: 0.93 mg/dL (ref 0.61–1.24)
GFR, Estimated: >60 mL/min (ref >60)
Glucose, Bld: 402 mg/dL — ABNORMAL HIGH (ref 70–99)
Potassium: 4.4 mmol/L (ref 3.5–5.1)
Sodium: 134 mmol/L — ABNORMAL LOW (ref 135–145)
Total Bilirubin: 0.8 mg/dL (ref 0.3–1.2)
Total Protein: 6.7 g/dL (ref 6.5–8.1)

## 2021-08-13 LAB — CBC
HCT: 35.6 % — ABNORMAL LOW (ref 39.0–52.0)
Hemoglobin: 12.5 g/dL — ABNORMAL LOW (ref 13.0–17.0)
MCH: 29.3 pg (ref 26.0–34.0)
MCHC: 35.1 g/dL (ref 30.0–36.0)
MCV: 83.6 fL (ref 80.0–100.0)
Platelets: 221 10*3/uL (ref 150–400)
RBC: 4.26 MIL/uL (ref 4.22–5.81)
RDW: 11.9 % (ref 11.5–15.5)
WBC: 7.2 10*3/uL (ref 4.0–10.5)
nRBC: 0 % (ref 0.0–0.2)

## 2021-08-13 LAB — GLUCOSE, CAPILLARY
Glucose-Capillary: 379 mg/dL — ABNORMAL HIGH (ref 70–99)
Glucose-Capillary: 354 mg/dL — ABNORMAL HIGH (ref 70–99)
Glucose-Capillary: 409 mg/dL — ABNORMAL HIGH (ref 70–99)
Glucose-Capillary: 309 mg/dL — ABNORMAL HIGH (ref 70–99)
Glucose-Capillary: 334 mg/dL — ABNORMAL HIGH (ref 70–99)

## 2021-08-13 NOTE — Progress Notes (Signed)
PROGRESS NOTE  Eric [name] IOX:[id number] DOB: [birth year] DOA: 08/11/2021 PCP: Pablo Lawrence, NP  Brief History:  36 year old male with a history of type 1 diabetes mellitus, anxiety/depression presenting with worsening left hand infection.  The patient states that he initially had 2 boils on the dorsum of his left index finger around the PIP joint.  He had his spouse palpable is with her fingernails.  He woke up on the morning of 08/08/2021 with increasing erythema, edema, and pain.  He went to the emergency department where he was given an injection of ceftriaxone.  He was discharged home with cephalexin and Naprosyn.  He continued to have worsening of his left hand with increasing erythema, edema, and pain spreading proximally.  He denied any fevers, chills, chest pain, shortness of breath, vomiting, diarrhea, abdominal pain.  On the evening of 08/10/2021, he stated that he had his spouse tried to " "lance the infection" with a home knife.  He stated that only drained clear fluid.  Because his hand did not improve, he presented for further evaluation and treatment. In the ED, the patient was afebrile hemodynamically stable with oxygen saturation 99% room air.  WBC 9.4, hemoglobin 13.6, platelets 225,000.  LFTs were unremarkable.  Sodium 129, potassium 4.4, serum creatinine 1.09.  X-ray of the left hand was negative for any foreign body or osteomyelitis.  There is soft tissue edema.  Chest x-ray was negative.  The patient was started vancomycin.    Assessment and Plan: * Cellulitis of finger of left hand Patient failed outpatient antibiotics Continue IV vancomycin continue cefepime CT left hand--no abscess, no FB, no septic arthritis ESR--58 CRP--13.7  Other synovitis and tenosynovitis, left hand Treatment plan as discussed above  Hyperosmolar non-ketotic state due to type 2 diabetes mellitus [facility]) Patient presented with serum glucose 688, normal anion gap Improved on fluid  resuscitation and bolusing with his insulin pump Continue IV fluids  Uncontrolled type 1 diabetes mellitus with hyperglycemia, with long-term current use of insulin ([place]) 07/05/2021 hemoglobin A1c 10.5 Patient endorses some dietary indiscretion Start insulin pump protocol while inpatient -if CBGs remain significantly elevated, may need to use injectable [place] insulin  Hyponatremia Secondary to volume depletion and hyperglycemia Continue IV fluids Improve glycemic control  Tobacco use Cessation discussed    Family Communication:   spouse at bedside 7/1   Consultants:   none   Code Status:  FULL    DVT Prophylaxis:  [place] Lovenox     Procedures: As Listed in Progress Note Above   Antibiotics: Vanc 7/1>> Cefepime 7/1>>        Subjective: Left hand pain about same.  Feels like erythema is a little better.  Denies f/c, cp, n/v/d.    Objective: Vitals:   08/12/21 2022 08/13/21 0100 08/13/21 0700 08/13/21 1447  BP: 106/73 (!) 155/103 135/79 128/80  Pulse: 70 97 87 83  Resp: 20 19 19 18   Temp: 97.7 F (36.5 C) 98.6 F (37 C) 98.5 F (36.9 C) 98.8 F (37.1 C)  TempSrc: Oral Oral Oral Oral  SpO2: 96% 98% 100% 98%  Weight:      Height:        Intake/Output Summary (Last 24 hours) at 08/13/2021 1720 Last data filed at 08/13/2021 1300 Gross per 24 hour  Intake 300 ml  Output --  Net 300 ml   Weight change:  Exam:  General:  Pt is alert, follows commands appropriately, not in acute  distress HEENT: No icterus, No thrush, No neck mass, [place]/AT Cardiovascular: RRR, S1/S2, no rubs, no gallops Respiratory: CTA bilaterally, no wheezing, no crackles, no rhonchi Abdomen: Soft/+BS, non tender, non distended, no guarding Extremities: see left hand pic below with edema and erythema about index finger    Data Reviewed: I have personally reviewed following labs and imaging studies Basic Metabolic Panel: Recent Labs  Lab 08/08/21 1635 08/12/21 0023 08/12/21 0028  08/12/21 0611 08/13/21 0610  NA 136 129*  --  134* 134*  K 3.9 4.4  --  3.6 4.4  CL 99 93*  --  98 96*  CO2 27 28  --  27 31  GLUCOSE 139* 688*  --  310* 402*  BUN 15 19  --  18 12  CREATININE 0.96 1.09  --  0.96 0.93  CALCIUM 9.6 9.0  --  8.9 9.0  MG  --   --  2.2  --   --   PHOS  --   --  2.8  --   --    Liver Function Tests: Recent Labs  Lab 08/12/21 0023 08/13/21 0610  AST 10* 8*  ALT 17 14  ALKPHOS 105 82  BILITOT 1.0 0.8  PROT 7.6 6.7  ALBUMIN 4.0 3.2*   No results for input(s): "LIPASE", "AMYLASE" in the last 168 hours. No results for input(s): "AMMONIA" in the last 168 hours. Coagulation Profile: Recent Labs  Lab 08/12/21 0023  INR 0.9   CBC: Recent Labs  Lab 08/08/21 1635 08/12/21 0023 08/12/21 0611 08/13/21 0610  WBC 15.4* 9.4 10.9* 7.2  NEUTROABS 13.8* 7.7  --   --   HGB 14.7 13.6 12.8* 12.5*  HCT 41.5 38.4* 36.2* 35.6*  MCV 83.8 82.4 82.8 83.6  PLT 261 225 244 221   Cardiac Enzymes: No results for input(s): "CKTOTAL", "CKMB", "CKMBINDEX", "TROPONINI" in the last 168 hours. BNP: Invalid input(s): "POCBNP" CBG: Recent Labs  Lab 08/12/21 2125 08/13/21 0300 08/13/21 0721 08/13/21 1147 08/13/21 1713  GLUCAP 310* 379* 354* 409* 309*   HbA1C: No results for input(s): "HGBA1C" in the last 72 hours. Urine analysis:    Component Value Date/Time   COLORURINE YELLOW 07/10/2021 1150   APPEARANCEUR CLEAR 07/10/2021 1150   LABSPEC 1.029 07/10/2021 1150   PHURINE 5.0 07/10/2021 1150   GLUCOSEU >=500 (A) 07/10/2021 1150   HGBUR NEGATIVE 07/10/2021 1150   BILIRUBINUR NEGATIVE 07/10/2021 1150   KETONESUR 20 (A) 07/10/2021 1150   PROTEINUR 30 (A) 07/10/2021 1150   UROBILINOGEN 0.2 02/15/2013 1533   NITRITE NEGATIVE 07/10/2021 [place] 07/10/2021 1150   Sepsis Labs: @LABRCNTIP (procalcitonin:4,lacticidven:4) ) Recent Results (from the past 240 hour(s))  Blood Culture (routine x 2)     Status: None (Preliminary result)    Collection Time: 08/12/21 12:17 AM   Specimen: Left Antecubital; Blood  Result Value Ref Range Status   Specimen Description   Final    LEFT ANTECUBITAL BOTTLES DRAWN AEROBIC AND ANAEROBIC   Special Requests Blood Culture adequate volume  Final   Culture   Final    NO GROWTH < 24 HOURS Performed at [facility], [address], [place], [place] [address]    Report Status PENDING  Incomplete  Blood Culture (routine x 2)     Status: None (Preliminary result)   Collection Time: 08/12/21 12:23 AM   Specimen: Right Antecubital; Blood  Result Value Ref Range Status   Specimen Description   Final    RIGHT ANTECUBITAL BOTTLES DRAWN  AEROBIC AND ANAEROBIC   Special Requests Blood Culture adequate volume  Final   Culture   Final    NO GROWTH < 24 HOURS Performed at [facility], [address] [place], [place] [address]    Report Status PENDING  Incomplete     Scheduled Meds:  enoxaparin (LOVENOX) injection  40 mg Subcutaneous Q24H   insulin pump   Subcutaneous TID WC, HS, 0200   Continuous Infusions:  ceFEPime (MAXIPIME) IV 2 g (08/13/21 1517)   lactated ringers 125 mL/hr at 08/13/21 0919   vancomycin 1,000 mg (08/13/21 1007)    Procedures/Studies: CT HAND LEFT W CONTRAST  Result Date: 08/12/2021 CLINICAL DATA:  Hand cellulitis. Concern for abscess and septic arthritis. History of diabetes. EXAM: CT OF THE UPPER LEFT EXTREMITY WITH CONTRAST TECHNIQUE: Multidetector CT imaging of the left hand was performed according to the standard protocol following intravenous contrast administration. RADIATION DOSE REDUCTION: This exam was performed according to the departmental dose-optimization program which includes automated exposure control, adjustment of the mA and/or kV according to patient size and/or use of iterative reconstruction technique. CONTRAST:  143m OMNIPAQUE IOHEXOL 300 MG/ML  SOLN COMPARISON:  Radiographs same date. FINDINGS: Bones/Joint/Cartilage No evidence of acute fracture,  dislocation or bone destruction. The joint spaces are preserved. No erosive changes, large joint effusions or abnormal synovial enhancement identified. Ligaments Suboptimally assessed by CT. Muscles and Tendons No intramuscular fluid collection or abnormal enhancement identified. No significant tenosynovitis identified. Soft tissues As seen on earlier radiographs, there is soft tissue swelling in the radial aspect of the hand with extension into the index finger. No focal fluid collection, soft tissue emphysema or foreign body identified. Atherosclerosis noted in the radial and ulnar arteries. IMPRESSION: 1. Soft tissue swelling in the radial aspect of the hand extending into the index finger, consistent with soft tissue infection (cellulitis). 2. No focal fluid collection, foreign body or soft tissue emphysema identified. 3. No evidence of osteomyelitis or septic arthritis by CT. Electronically Signed   By: WRichardean SaleM.D.   On: 08/12/2021 10:44   DG Hand Complete Left  Result Date: 08/12/2021 CLINICAL DATA:  Soft tissue infection EXAM: LEFT HAND - COMPLETE 3+ VIEW COMPARISON:  None Available. FINDINGS: Considerable soft tissue swelling is noted involving primarily the second digit consistent with the given clinical history. Underlying bony structures are within normal limits. No radiopaque foreign body is noted. IMPRESSION: Swelling in the second digit without acute bony abnormality. Electronically Signed   By: MInez CatalinaM.D.   On: 08/12/2021 01:04   DG Chest Port 1 View  Result Date: 08/12/2021 CLINICAL DATA:  Sepsis EXAM: PORTABLE CHEST 1 VIEW COMPARISON:  01/03/2021 FINDINGS: The heart size and mediastinal contours are within normal limits. Both lungs are clear. The visualized skeletal structures are unremarkable. IMPRESSION: No active disease. Electronically Signed   By: MInez CatalinaM.D.   On: 08/12/2021 00:11    DOrson Eva DO  [facility]  If 7PM-7AM, please contact  night-coverage [email or web address] Password [email or web address] 08/13/2021, 5:20 PM   LOS: 1 day

## 2021-08-14 DIAGNOSIS — L03012 Cellulitis of left finger: Secondary | ICD-10-CM | POA: Diagnosis not present

## 2021-08-14 DIAGNOSIS — M65842 Other synovitis and tenosynovitis, left hand: Secondary | ICD-10-CM | POA: Diagnosis not present

## 2021-08-14 DIAGNOSIS — E1100 Type 2 diabetes mellitus with hyperosmolarity without nonketotic hyperglycemic-hyperosmolar coma (NKHHC): Secondary | ICD-10-CM | POA: Diagnosis not present

## 2021-08-14 DIAGNOSIS — E871 Hypo-osmolality and hyponatremia: Secondary | ICD-10-CM | POA: Diagnosis not present

## 2021-08-14 LAB — BASIC METABOLIC PANEL
Anion gap: 6 (ref 5–15)
BUN: 9 mg/dL (ref 6–20)
CO2: 32 mmol/L (ref 22–32)
Calcium: 9 mg/dL (ref 8.9–10.3)
Chloride: 97 mmol/L — ABNORMAL LOW (ref 98–111)
Creatinine, Ser: 0.83 mg/dL (ref 0.61–1.24)
GFR, Estimated: >60 mL/min (ref >60)
Glucose, Bld: 294 mg/dL — ABNORMAL HIGH (ref 70–99)
Potassium: 3.6 mmol/L (ref 3.5–5.1)
Sodium: 135 mmol/L (ref 135–145)

## 2021-08-14 LAB — CBC
HCT: 38.6 % — ABNORMAL LOW (ref 39.0–52.0)
Hemoglobin: 13.5 g/dL (ref 13.0–17.0)
MCH: 28.8 pg (ref 26.0–34.0)
MCHC: 35 g/dL (ref 30.0–36.0)
MCV: 82.5 fL (ref 80.0–100.0)
Platelets: 262 10*3/uL (ref 150–400)
RBC: 4.68 MIL/uL (ref 4.22–5.81)
RDW: 11.8 % (ref 11.5–15.5)
WBC: 7 10*3/uL (ref 4.0–10.5)
nRBC: 0 % (ref 0.0–0.2)

## 2021-08-14 LAB — GLUCOSE, CAPILLARY
Glucose-Capillary: 117 mg/dL — ABNORMAL HIGH (ref 70–99)
Glucose-Capillary: 62 mg/dL — ABNORMAL LOW (ref 70–99)
Glucose-Capillary: 318 mg/dL — ABNORMAL HIGH (ref 70–99)
Glucose-Capillary: 276 mg/dL — ABNORMAL HIGH (ref 70–99)
Glucose-Capillary: 449 mg/dL — ABNORMAL HIGH (ref 70–99)
Glucose-Capillary: 160 mg/dL — ABNORMAL HIGH (ref 70–99)
Glucose-Capillary: 254 mg/dL — ABNORMAL HIGH (ref 70–99)

## 2021-08-14 LAB — C-REACTIVE PROTEIN: CRP: 5.2 mg/dL — ABNORMAL HIGH (ref <1.0)

## 2021-08-14 MED ORDER — OXYCODONE HCL 5 MG PO TABS
5.0000 mg | ORAL_TABLET | Freq: Four times a day (QID) | ORAL | Status: DC | PRN
  Administered 2021-08-14 – 2021-08-15 (×2): 5 mg via ORAL
  Filled 2021-08-14 (×2): qty 1

## 2021-08-14 NOTE — Progress Notes (Signed)
PROGRESS NOTE  Eric [name]:[id number] DOB: Aug 15, 1985 DOA: 08/11/2021 PCP: Pablo Lawrence, NP  Brief History:  36 year old male with a history of type 1 diabetes mellitus, anxiety/depression presenting with worsening left hand infection.  The patient states that he initially had 2 boils on the dorsum of his left index finger around the PIP joint.  He had his spouse palpable is with her fingernails.  He woke up on the morning of 08/08/2021 with increasing erythema, edema, and pain.  He went to the emergency department where he was given an injection of ceftriaxone.  He was discharged home with cephalexin and Naprosyn.  He continued to have worsening of his left hand with increasing erythema, edema, and pain spreading proximally.  He denied any fevers, chills, chest pain, shortness of breath, vomiting, diarrhea, abdominal pain.  On the evening of 08/10/2021, he stated that he had his spouse tried to " "lance the infection" with a home knife.  He stated that only drained clear fluid.  Because his hand did not improve, he presented for further evaluation and treatment. In the ED, the patient was afebrile hemodynamically stable with oxygen saturation 99% room air.  WBC 9.4, hemoglobin 13.6, platelets 225,000.  LFTs were unremarkable.  Sodium 129, potassium 4.4, serum creatinine 1.09.  X-ray of the left hand was negative for any foreign body or osteomyelitis.  There is soft tissue edema.  Chest x-ray was negative.  The patient was started vancomycin.     Assessment and Plan: * Cellulitis of finger of left hand Patient failed outpatient antibiotics Continue IV vancomycin continue cefepime CT left hand--no abscess, no FB, no septic arthritis ESR--58 CRP--13.7>>5.2  Other synovitis and tenosynovitis, left hand Treatment plan as discussed above  Hyperosmolar non-ketotic state due to type 2 diabetes mellitus [facility]) Patient presented with serum glucose 688, normal anion gap Improved on  fluid resuscitation and bolusing with his insulin pump Continue IV fluids  Uncontrolled type 1 diabetes mellitus with hyperglycemia, with long-term current use of insulin ([place]) 07/05/2021 hemoglobin A1c 10.5 Patient endorses some dietary indiscretion Start insulin pump protocol while inpatient -after extensive discussion, patient will increase his basal rate on his insulin pump by 25 % in 24 hour period -he will arrange endocrine follow withing 2-3 weeks after d/c  Hyponatremia Secondary to volume depletion and hyperglycemia Continue IV fluids Improve glycemic control  Tobacco use Cessation discussed   Family Communication:   spouse at bedside 7/3   Consultants:   none   Code Status:  FULL    DVT Prophylaxis:  [place] Lovenox     Procedures: As Listed in Progress Note Above   Antibiotics: Vanc 7/1>> Cefepime 7/1>>      Subjective: Patient states finger is starting to feel better.  Still having significant pain, but denies f/c, n/v/d, cp ,sob  Objective: Vitals:   08/13/21 1447 08/13/21 2117 08/14/21 0433 08/14/21 1339  BP: 128/80 (!) 154/89 (!) 151/98 (!) 154/102  Pulse: 83 83 94 96  Resp: 18 18 18    Temp: 98.8 F (37.1 C) 98.3 F (36.8 C) 98.3 F (36.8 C) 98.2 F (36.8 C)  TempSrc: Oral  Oral   SpO2: 98% 97% 99% 98%  Weight:      Height:        Intake/Output Summary (Last 24 hours) at 08/14/2021 1805 Last data filed at 08/14/2021 0900 Gross per 24 hour  Intake 1240 ml  Output --  Net 1240 ml  Weight change:  Exam:  General:  Pt is alert, follows commands appropriately, not in acute distress HEENT: No icterus, No thrush, No neck mass, [place]/AT Cardiovascular: RRR, S1/S2, no rubs, no gallops Respiratory: CTA bilaterally, no wheezing, no crackles, no rhonchi Abdomen: Soft/+BS, non tender, non distended, no guarding Extremities:     Data Reviewed: I have personally reviewed following labs and imaging studies Basic Metabolic Panel: Recent Labs  Lab  08/08/21 1635 08/12/21 0023 08/12/21 0028 08/12/21 0611 08/13/21 0610 08/14/21 0638  NA 136 129*  --  134* 134* 135  K 3.9 4.4  --  3.6 4.4 3.6  CL 99 93*  --  98 96* 97*  CO2 27 28  --  27 31 32  GLUCOSE 139* 688*  --  310* 402* 294*  BUN 15 19  --  18 12 9   CREATININE 0.96 1.09  --  0.96 0.93 0.83  CALCIUM 9.6 9.0  --  8.9 9.0 9.0  MG  --   --  2.2  --   --   --   PHOS  --   --  2.8  --   --   --    Liver Function Tests: Recent Labs  Lab 08/12/21 0023 08/13/21 0610  AST 10* 8*  ALT 17 14  ALKPHOS 105 82  BILITOT 1.0 0.8  PROT 7.6 6.7  ALBUMIN 4.0 3.2*   No results for input(s): "LIPASE", "AMYLASE" in the last 168 hours. No results for input(s): "AMMONIA" in the last 168 hours. Coagulation Profile: Recent Labs  Lab 08/12/21 0023  INR 0.9   CBC: Recent Labs  Lab 08/08/21 1635 08/12/21 0023 08/12/21 0611 08/13/21 0610 08/14/21 0638  WBC 15.4* 9.4 10.9* 7.2 7.0  NEUTROABS 13.8* 7.7  --   --   --   HGB 14.7 13.6 12.8* 12.5* 13.5  HCT 41.5 38.4* 36.2* 35.6* 38.6*  MCV 83.8 82.4 82.8 83.6 82.5  PLT 261 225 244 221 262   Cardiac Enzymes: No results for input(s): "CKTOTAL", "CKMB", "CKMBINDEX", "TROPONINI" in the last 168 hours. BNP: Invalid input(s): "POCBNP" CBG: Recent Labs  Lab 08/14/21 0154 08/14/21 0434 08/14/21 0721 08/14/21 1124 08/14/21 1613  GLUCAP 117* 318* 276* 449* 160*   HbA1C: No results for input(s): "HGBA1C" in the last 72 hours. Urine analysis:    Component Value Date/Time   COLORURINE YELLOW 07/10/2021 1150   APPEARANCEUR CLEAR 07/10/2021 1150   LABSPEC 1.029 07/10/2021 1150   PHURINE 5.0 07/10/2021 1150   GLUCOSEU >=500 (A) 07/10/2021 1150   HGBUR NEGATIVE 07/10/2021 1150   BILIRUBINUR NEGATIVE 07/10/2021 1150   KETONESUR 20 (A) 07/10/2021 1150   PROTEINUR 30 (A) 07/10/2021 1150   UROBILINOGEN 0.2 02/15/2013 1533   NITRITE NEGATIVE 07/10/2021 [place] 07/10/2021 1150   Sepsis  Labs: @LABRCNTIP (procalcitonin:4,lacticidven:4) ) Recent Results (from the past 240 hour(s))  Blood Culture (routine x 2)     Status: None (Preliminary result)   Collection Time: 08/12/21 12:17 AM   Specimen: Left Antecubital; Blood  Result Value Ref Range Status   Specimen Description   Final    LEFT ANTECUBITAL BOTTLES DRAWN AEROBIC AND ANAEROBIC   Special Requests Blood Culture adequate volume  Final   Culture   Final    NO GROWTH 2 DAYS Performed at [facility], [address], [place], [place] [address]    Report Status PENDING  Incomplete  Blood Culture (routine x 2)     Status: None (Preliminary result)   Collection  Time: 08/12/21 12:23 AM   Specimen: Right Antecubital; Blood  Result Value Ref Range Status   Specimen Description   Final    RIGHT ANTECUBITAL BOTTLES DRAWN AEROBIC AND ANAEROBIC   Special Requests Blood Culture adequate volume  Final   Culture   Final    NO GROWTH 2 DAYS Performed at [facility], [address] [place], [place] [address]    Report Status PENDING  Incomplete     Scheduled Meds:  enoxaparin (LOVENOX) injection  40 mg Subcutaneous Q24H   insulin pump   Subcutaneous TID WC, HS, 0200   Continuous Infusions:  ceFEPime (MAXIPIME) IV 2 g (08/14/21 1700)   vancomycin 1,000 mg (08/14/21 1105)    Procedures/Studies: CT HAND LEFT W CONTRAST  Result Date: 08/12/2021 CLINICAL DATA:  Hand cellulitis. Concern for abscess and septic arthritis. History of diabetes. EXAM: CT OF THE UPPER LEFT EXTREMITY WITH CONTRAST TECHNIQUE: Multidetector CT imaging of the left hand was performed according to the standard protocol following intravenous contrast administration. RADIATION DOSE REDUCTION: This exam was performed according to the departmental dose-optimization program which includes automated exposure control, adjustment of the mA and/or kV according to patient size and/or use of iterative reconstruction technique. CONTRAST:  120m OMNIPAQUE IOHEXOL 300  MG/ML  SOLN COMPARISON:  Radiographs same date. FINDINGS: Bones/Joint/Cartilage No evidence of acute fracture, dislocation or bone destruction. The joint spaces are preserved. No erosive changes, large joint effusions or abnormal synovial enhancement identified. Ligaments Suboptimally assessed by CT. Muscles and Tendons No intramuscular fluid collection or abnormal enhancement identified. No significant tenosynovitis identified. Soft tissues As seen on earlier radiographs, there is soft tissue swelling in the radial aspect of the hand with extension into the index finger. No focal fluid collection, soft tissue emphysema or foreign body identified. Atherosclerosis noted in the radial and ulnar arteries. IMPRESSION: 1. Soft tissue swelling in the radial aspect of the hand extending into the index finger, consistent with soft tissue infection (cellulitis). 2. No focal fluid collection, foreign body or soft tissue emphysema identified. 3. No evidence of osteomyelitis or septic arthritis by CT. Electronically Signed   By: WRichardean SaleM.D.   On: 08/12/2021 10:44   DG Hand Complete Left  Result Date: 08/12/2021 CLINICAL DATA:  Soft tissue infection EXAM: LEFT HAND - COMPLETE 3+ VIEW COMPARISON:  None Available. FINDINGS: Considerable soft tissue swelling is noted involving primarily the second digit consistent with the given clinical history. Underlying bony structures are within normal limits. No radiopaque foreign body is noted. IMPRESSION: Swelling in the second digit without acute bony abnormality. Electronically Signed   By: MInez CatalinaM.D.   On: 08/12/2021 01:04   DG Chest Port 1 View  Result Date: 08/12/2021 CLINICAL DATA:  Sepsis EXAM: PORTABLE CHEST 1 VIEW COMPARISON:  01/03/2021 FINDINGS: The heart size and mediastinal contours are within normal limits. Both lungs are clear. The visualized skeletal structures are unremarkable. IMPRESSION: No active disease. Electronically Signed   By: MInez Catalina M.D.   On: 08/12/2021 00:11    DOrson Eva DO  [facility]  If 7PM-7AM, please contact night-coverage [email or web address] Password [email or web address] 08/14/2021, 6:05 PM   LOS: 2 days

## 2021-08-14 NOTE — TOC Progression Note (Signed)
  Transition of Care [facility]) Screening Note   Patient Details  Name: Eric [name] Date of Birth: 03-21-1985   Transition of [facility]) CM/SW Contact:    Elliot Gault, LCSW Phone Number: 08/14/2021, 11:32 AM    Transition of Care Department [facility]) has reviewed patient and no TOC needs have been identified at this time. We will continue to monitor patient advancement through interdisciplinary progression rounds. If new patient transition needs arise, please place a TOC consult.

## 2021-08-14 NOTE — Inpatient Diabetes Management (Signed)
Inpatient Diabetes Program Recommendations  AACE/ADA: New Consensus Statement on Inpatient Glycemic Control (2015)  Target Ranges:  Prepandial:   less than 140 mg/dL      Peak postprandial:   less than 180 mg/dL (1-2 hours)      Critically ill patients:  140 - 180 mg/dL   Lab Results  Component Value Date   GLUCAP 449 (H) 08/14/2021   HGBA1C 8.2 (H) 03/30/2017    Review of Glycemic Control  Diabetes history: DM type 1 Outpatient Diabetes medications: medtronic 670G with guardian CGM Medtronic 670G insulin pump settings as of 07/2020 BR: 0000-06:20: 1.25 un/hr 06:30-20:30: 1.45 un/hr 20:30-2400: 1.0 un/hr Total basal = 31.925 un   I:C 1:12 ISF(35),  Current orders for Inpatient glycemic control:  Insulin pump order set  Discussed with MD due to uncontrolled glucose trends while on insulin pump. Will transition pt to SQ regimen.  Inpatient Diabetes Program Recommendations:    Discussed the following with Dr. Arbutus Leas:  -  Semglee 35 units -  Novolog 0-15 units tid + hs -  Novolog 5 units tid meal coverage  Spoke with pt over the phone regarding Glucose trends and outpt follow up. Pt reports seeing his Endo every 6 months, however, he reports needing to reschedule his 6 month appt and the only appts available was scheduled months later. Pt reports a scheduled follow up soon but is unable to remember the exact date. Discussed plan of care in the hospital and told pt we will switch to SQ regimen and titrate up to control levels. Also discussed the possibility to continue to do this at time of d/c for glucose control as his insulin needs are more than what his pump administers. Pt is agreeable at this time.  Thanks,  Eric Deem RN, MSN, BC-ADM Inpatient Diabetes Coordinator Team Pager [phone] (8a-5p)

## 2021-08-14 NOTE — Progress Notes (Signed)
Received call from [facility] that pt had sustained HR 135-140 bpm. Arrived to room to find pt up walking around in room. Pt denies any c/o, had pt sit on bed and HR down to 100-104. Lungs clear and diminshed in bases, HR with RRR, abd soft with active bowel sounds. Skin warm and dry. Wound to left index finger scabbed with no drainage. Pt states pain manageable at this time. Advised to call for assistance, states understanding. Call bell in reach.

## 2021-08-14 NOTE — Plan of Care (Signed)

## 2021-08-15 DIAGNOSIS — E871 Hypo-osmolality and hyponatremia: Secondary | ICD-10-CM | POA: Diagnosis not present

## 2021-08-15 DIAGNOSIS — L03012 Cellulitis of left finger: Secondary | ICD-10-CM | POA: Diagnosis not present

## 2021-08-15 DIAGNOSIS — M65842 Other synovitis and tenosynovitis, left hand: Secondary | ICD-10-CM | POA: Diagnosis not present

## 2021-08-15 DIAGNOSIS — E1100 Type 2 diabetes mellitus with hyperosmolarity without nonketotic hyperglycemic-hyperosmolar coma (NKHHC): Secondary | ICD-10-CM | POA: Diagnosis not present

## 2021-08-15 LAB — GLUCOSE, CAPILLARY: Glucose-Capillary: 343 mg/dL — ABNORMAL HIGH (ref 70–99)

## 2021-08-15 MED ORDER — DOXYCYCLINE HYCLATE 100 MG PO TABS: 100.0000 mg | ORAL_TABLET | Freq: Two times a day (BID) | ORAL | Status: DC

## 2021-08-15 MED ORDER — OXYCODONE HCL 5 MG PO TABS: 5.0000 mg | ORAL_TABLET | Freq: Four times a day (QID) | ORAL | 0 refills | Status: DC | PRN

## 2021-08-15 MED ORDER — DOXYCYCLINE HYCLATE 100 MG PO TABS: 100.0000 mg | ORAL_TABLET | Freq: Two times a day (BID) | ORAL | 0 refills | Status: DC

## 2021-08-15 MED ORDER — AMOXICILLIN-POT CLAVULANATE 875-125 MG PO TABS: 1.0000 | ORAL_TABLET | Freq: Two times a day (BID) | ORAL | Status: DC

## 2021-08-15 MED ORDER — AMOXICILLIN-POT CLAVULANATE 875-125 MG PO TABS: 1.0000 | ORAL_TABLET | Freq: Two times a day (BID) | ORAL | 0 refills | Status: DC

## 2021-08-15 NOTE — Discharge Summary (Signed)
Physician Discharge Summary   Patient: Eric [name] MRN: [id number] DOB: July 27, 1985  Admit date:     08/11/2021  Discharge date: 08/15/21  Discharge Physician: Shanon Brow Damen [place]   PCP: Pablo Lawrence, NP   Recommendations at discharge:   Please follow up with primary care provider within 1-2 weeks  Please repeat BMP and CBC in one week    Hospital Course: 36 year old male with a history of type 1 diabetes mellitus, anxiety/depression presenting with worsening left hand infection.  The patient states that he initially had 2 boils on the dorsum of his left index finger around the PIP joint.  He had his spouse palpable is with her fingernails.  He woke up on the morning of 08/08/2021 with increasing erythema, edema, and pain.  He went to the emergency department where he was given an injection of ceftriaxone.  He was discharged home with cephalexin and Naprosyn.  He continued to have worsening of his left hand with increasing erythema, edema, and pain spreading proximally.  He denied any fevers, chills, chest pain, shortness of breath, vomiting, diarrhea, abdominal pain.  On the evening of 08/10/2021, he stated that he had his spouse tried to " "lance the infection" with a home knife.  He stated that only drained clear fluid.  Because his hand did not improve, he presented for further evaluation and treatment. In the ED, the patient was afebrile hemodynamically stable with oxygen saturation 99% room air.  WBC 9.4, hemoglobin 13.6, platelets 225,000.  LFTs were unremarkable.  Sodium 129, potassium 4.4, serum creatinine 1.09.  X-ray of the left hand was negative for any foreign body or osteomyelitis.  There is soft tissue edema.  Chest x-ray was negative.  The patient was started vancomycin.  Assessment and Plan: * Cellulitis of finger of left hand Patient failed outpatient antibiotics Continue IV vancomycin continue cefepime CT left hand--no abscess, no FB, no septic  arthritis ESR--58 CRP--13.7>>5.2 D/c home with amox/clav and doxy x 7 more days  Other synovitis and tenosynovitis, left hand Treatment plan as discussed above  Hyperosmolar non-ketotic state due to type 2 diabetes mellitus [facility]) Patient presented with serum glucose 688, normal anion gap Improved on fluid resuscitation and bolusing with his insulin pump Continue IV fluids  Uncontrolled type 1 diabetes mellitus with hyperglycemia, with long-term current use of insulin ([place]) 07/05/2021 hemoglobin A1c 10.5 Patient endorses some dietary indiscretion Start insulin pump protocol while inpatient -after extensive discussion, patient will increase his basal rate on his insulin pump by 25 % in 24 hour period -he will arrange endocrine follow withing 2-3 weeks after d/c  Hyponatremia Secondary to volume depletion and hyperglycemia Continue IV fluids Improve glycemic control improved  Tobacco use Cessation discussed         Consultants: none Procedures performed: none  Disposition: Home Diet recommendation:  Carb modified diet DISCHARGE MEDICATION: Allergies as of 08/15/2021   No Known Allergies      Medication List     STOP taking these medications    cephALEXin 500 MG capsule Commonly known as: KEFLEX       TAKE these medications    amoxicillin-clavulanate 875-125 MG tablet Commonly known as: AUGMENTIN Take 1 tablet by mouth every 12 (twelve) hours.   ARIPiprazole 2 MG tablet Commonly known as: ABILIFY Take 2 mg by mouth daily.   cyclobenzaprine 5 MG tablet Commonly known as: FLEXERIL Take 1 tablet by mouth 3 (three) times daily.   diazepam 10 MG tablet Commonly known as: VALIUM Take 1  tablet (10 mg total) by mouth every 6 (six) hours as needed. What changed: reasons to take this   doxycycline 100 MG tablet Commonly known as: VIBRA-TABS Take 1 tablet (100 mg total) by mouth every 12 (twelve) hours.   escitalopram 20 MG tablet Commonly known as:  LEXAPRO Take 1 tablet by mouth daily.   gabapentin 300 MG capsule Commonly known as: NEURONTIN Take 1 capsule by mouth 3 (three) times daily.   hydrOXYzine 25 MG capsule Commonly known as: VISTARIL Take 25 mg by mouth 3 (three) times daily.   insulin lispro 100 UNIT/ML injection Commonly known as: HumaLOG Inject 1-10 Units into the skin 3 (three) times daily before meals. What changed:  how to take this when to take this additional instructions   meloxicam 7.5 MG tablet Commonly known as: MOBIC TAKE 1 TABLET(7.5 MG) BY MOUTH TWICE DAILY What changed: See the new instructions.   oxyCODONE 5 MG immediate release tablet Commonly known as: Oxy IR/ROXICODONE Take 1 tablet (5 mg total) by mouth every 6 (six) hours as needed for moderate pain.   Silvadene 1 % cream Generic drug: silver sulfADIAZINE Apply 1 Application topically 2 (two) times daily.   traZODone 100 MG tablet Commonly known as: DESYREL Take 100 mg by mouth at bedtime.        Discharge Exam: Filed Weights   08/11/21 2334 08/12/21 0418  Weight: 69.4 kg 67.8 kg   HEENT:  [place]/AT, No thrush, no icterus CV:  RRR, no rub, no S3, no S4 Lung:  CTA, no wheeze, no rhonchi Abd:  soft/+BS, NT Ext:  left index finger without necrosis or pus.  Mild erythema and edema left index finger  Condition at discharge: stable  The results of significant diagnostics from this hospitalization (including imaging, microbiology, ancillary and laboratory) are listed below for reference.   Imaging Studies: CT HAND LEFT W CONTRAST  Result Date: 08/12/2021 CLINICAL DATA:  Hand cellulitis. Concern for abscess and septic arthritis. History of diabetes. EXAM: CT OF THE UPPER LEFT EXTREMITY WITH CONTRAST TECHNIQUE: Multidetector CT imaging of the left hand was performed according to the standard protocol following intravenous contrast administration. RADIATION DOSE REDUCTION: This exam was performed according to the departmental  dose-optimization program which includes automated exposure control, adjustment of the mA and/or kV according to patient size and/or use of iterative reconstruction technique. CONTRAST:  163m OMNIPAQUE IOHEXOL 300 MG/ML  SOLN COMPARISON:  Radiographs same date. FINDINGS: Bones/Joint/Cartilage No evidence of acute fracture, dislocation or bone destruction. The joint spaces are preserved. No erosive changes, large joint effusions or abnormal synovial enhancement identified. Ligaments Suboptimally assessed by CT. Muscles and Tendons No intramuscular fluid collection or abnormal enhancement identified. No significant tenosynovitis identified. Soft tissues As seen on earlier radiographs, there is soft tissue swelling in the radial aspect of the hand with extension into the index finger. No focal fluid collection, soft tissue emphysema or foreign body identified. Atherosclerosis noted in the radial and ulnar arteries. IMPRESSION: 1. Soft tissue swelling in the radial aspect of the hand extending into the index finger, consistent with soft tissue infection (cellulitis). 2. No focal fluid collection, foreign body or soft tissue emphysema identified. 3. No evidence of osteomyelitis or septic arthritis by CT. Electronically Signed   By: WRichardean SaleM.D.   On: 08/12/2021 10:44   DG Hand Complete Left  Result Date: 08/12/2021 CLINICAL DATA:  Soft tissue infection EXAM: LEFT HAND - COMPLETE 3+ VIEW COMPARISON:  None Available. FINDINGS: Considerable soft tissue swelling  is noted involving primarily the second digit consistent with the given clinical history. Underlying bony structures are within normal limits. No radiopaque foreign body is noted. IMPRESSION: Swelling in the second digit without acute bony abnormality. Electronically Signed   By: Inez Catalina M.D.   On: 08/12/2021 01:04   DG Chest Port 1 View  Result Date: 08/12/2021 CLINICAL DATA:  Sepsis EXAM: PORTABLE CHEST 1 VIEW COMPARISON:  01/03/2021 FINDINGS:  The heart size and mediastinal contours are within normal limits. Both lungs are clear. The visualized skeletal structures are unremarkable. IMPRESSION: No active disease. Electronically Signed   By: Inez Catalina M.D.   On: 08/12/2021 00:11    Microbiology: Results for orders placed or performed during the hospital encounter of 08/11/21  Blood Culture (routine x 2)     Status: None (Preliminary result)   Collection Time: 08/12/21 12:17 AM   Specimen: Left Antecubital; Blood  Result Value Ref Range Status   Specimen Description   Final    LEFT ANTECUBITAL BOTTLES DRAWN AEROBIC AND ANAEROBIC   Special Requests Blood Culture adequate volume  Final   Culture   Final    NO GROWTH 3 DAYS Performed at [facility], [address], [place], [place] [address]    Report Status PENDING  Incomplete  Blood Culture (routine x 2)     Status: None (Preliminary result)   Collection Time: 08/12/21 12:23 AM   Specimen: Right Antecubital; Blood  Result Value Ref Range Status   Specimen Description   Final    RIGHT ANTECUBITAL BOTTLES DRAWN AEROBIC AND ANAEROBIC   Special Requests Blood Culture adequate volume  Final   Culture   Final    NO GROWTH 3 DAYS Performed at [facility], [address], [place], [place] [address]    Report Status PENDING  Incomplete    Labs: CBC: Recent Labs  Lab 08/08/21 1635 08/12/21 0023 08/12/21 0611 08/13/21 0610 08/14/21 0638  WBC 15.4* 9.4 10.9* 7.2 7.0  NEUTROABS 13.8* 7.7  --   --   --   HGB 14.7 13.6 12.8* 12.5* 13.5  HCT 41.5 38.4* 36.2* 35.6* 38.6*  MCV 83.8 82.4 82.8 83.6 82.5  PLT 261 225 244 221 622   Basic Metabolic Panel: Recent Labs  Lab 08/08/21 1635 08/12/21 0023 08/12/21 0028 08/12/21 0611 08/13/21 0610 08/14/21 0638  NA 136 129*  --  134* 134* 135  K 3.9 4.4  --  3.6 4.4 3.6  CL 99 93*  --  98 96* 97*  CO2 27 28  --  27 31 32  GLUCOSE 139* 688*  --  310* 402* 294*  BUN 15 19  --  '18 12 9  ' CREATININE 0.96 1.09  --  0.96 0.93 0.83   CALCIUM 9.6 9.0  --  8.9 9.0 9.0  MG  --   --  2.2  --   --   --   PHOS  --   --  2.8  --   --   --    Liver Function Tests: Recent Labs  Lab 08/12/21 0023 08/13/21 0610  AST 10* 8*  ALT 17 14  ALKPHOS 105 82  BILITOT 1.0 0.8  PROT 7.6 6.7  ALBUMIN 4.0 3.2*   CBG: Recent Labs  Lab 08/14/21 0721 08/14/21 1124 08/14/21 1613 08/14/21 2010 08/15/21 0720  GLUCAP 276* 449* 160* 254* 343*    Discharge time spent: greater than 30 minutes.  Signed: Orson Eva, MD [facility] 08/15/2021

## 2021-08-15 NOTE — Progress Notes (Signed)
OT Cancellation Note  Patient Details Name: Eric [name] MRN: [id number] DOB: 12/30/1985   Cancelled Treatment:    Reason Eval/Treat Not Completed: OT screened, no needs identified, will sign off. Pt independent, no skilled OT services required.    Ezra Sites, OTR/L  [phone] 08/15/2021, 9:16 AM

## 2021-08-15 NOTE — Plan of Care (Signed)

## 2021-08-15 NOTE — Evaluation (Signed)
Physical Therapy Evaluation Patient Details Name: Eric [name] MRN: [id number] DOB: 1985-07-12 Today's Date: 08/15/2021  History of Present Illness  Eric [name] is a 36 y.o. male with medical history significant of T1DM and tobacco use who presents to the emergency department due to worsening left hand skin infection.  Patient states that he presented to the ED on June 27 due to left second digit redness and inflammation, he was treated with IV Rocephin and was discharged with Keflex and naproxen, he was told to return to ED for worsening skin infection.  He complained of increased redness, swelling, pain and extension of the red rash across hand.  He was able to flex and extend the fingers, though with some pain.  Patient denies fever, chills, chest pain, shortness of breath, vomiting, diarrhea or constipation.    Clinical Impression  Patient lying in bed on therapist arrival sleeping.  Awakens quickly and is cooperative with therapy. Patient able to perform supine to sit with modified independence; slight extra time due to pain in his hands.  Sit to stand with modified independence; patient able to walk in the room without any AD or path deviation or loss of balance.  No further therapy services needed at this time.        Recommendations for follow up therapy are one component of a multi-disciplinary discharge planning process, led by the attending physician.  Recommendations may be updated based on patient status, additional functional criteria and insurance authorization.  Follow Up Recommendations No PT follow up      Assistance Recommended at Discharge PRN  Patient can return home with the following  Assistance with cooking/housework    Equipment Recommendations None recommended by PT  Recommendations for Other Services       Functional Status Assessment Patient has had a recent decline in their functional status and demonstrates the ability to make significant improvements  in function in a reasonable and predictable amount of time.     Precautions / Restrictions Precautions Precautions: None Restrictions Weight Bearing Restrictions: No      Mobility  Bed Mobility Overal bed mobility: Modified Independent             General bed mobility comments: slight extra time for bed mobility due to pain    Transfers Overall transfer level: Modified independent Equipment used: None                    Ambulation/Gait Ambulation/Gait assistance: Modified independent (Device/Increase time) Gait Distance (Feet): 60 Feet Assistive device: None         General Gait Details: slight decreased gait speed; no loss of balance or path deviation noted  Stairs            Wheelchair Mobility    Modified Rankin (Stroke Patients Only)       Balance Overall balance assessment: Modified Independent                                           Pertinent Vitals/Pain Pain Assessment Pain Assessment: 0-10 Pain Score: 6  Pain Location: left hand Pain Intervention(s): Monitored during session    Home Living Family/patient expects to be discharged to:: Private residence Living Arrangements: Spouse/significant other;Children Available Help at Discharge: Family Type of Home: Mobile home Home Access: Stairs to enter Entrance Stairs-Rails: Right;Left;Can reach both Entrance Stairs-Number of Steps: 2  Home Layout: One level Home Equipment: None      Prior Function Prior Level of Function : Independent/Modified Independent                     Hand Dominance        Extremity/Trunk Assessment   Upper Extremity Assessment Upper Extremity Assessment: Generalized weakness    Lower Extremity Assessment Lower Extremity Assessment: Generalized weakness    Cervical / Trunk Assessment Cervical / Trunk Assessment: Normal  Communication   Communication: No difficulties  Cognition Arousal/Alertness:  Awake/alert Behavior During Therapy: WFL for tasks assessed/performed Overall Cognitive Status: Within Functional Limits for tasks assessed                                 General Comments: sleeping on arrival but awakens and is cooperative with therapy        General Comments      Exercises     Assessment/Plan    PT Assessment Patient does not need any further PT services  PT Problem List         PT Treatment Interventions      PT Goals (Current goals can be found in the Care Plan section)  Acute Rehab PT Goals Patient Stated Goal: return home PT Goal Formulation: With patient Time For Goal Achievement: 08/29/21 Potential to Achieve Goals: Good    Frequency       Co-evaluation               AM-PAC PT "6 Clicks" Mobility  Outcome Measure Help needed turning from your back to your side while in a flat bed without using bedrails?: None Help needed moving from lying on your back to sitting on the side of a flat bed without using bedrails?: None Help needed moving to and from a bed to a chair (including a wheelchair)?: None Help needed standing up from a chair using your arms (e.g., wheelchair or bedside chair)?: A Little Help needed to walk in hospital room?: None Help needed climbing 3-5 steps with a railing? : A Little 6 Click Score: 22    End of Session   Activity Tolerance: Patient tolerated treatment well Patient left: in bed;with family/visitor present;with call bell/phone within reach Nurse Communication: Mobility status PT Visit Diagnosis: Pain Pain - Right/Left: Left Pain - part of body: Hand    Time: 0820-0830 PT Time Calculation (min) (ACUTE ONLY): 10 min   Charges:   PT Evaluation $PT Eval Low Complexity: 1 Low PT Treatments $Therapeutic Activity: 8-22 mins        10:12 AM, 08/15/21 Navil Kole Small Helaman Mecca MPT [facility] [place] [phone] [phone]

## 2021-08-15 NOTE — Progress Notes (Signed)
Nsg Discharge Note  Admit Date:  08/11/2021 Discharge date: 08/15/2021   [name] to be D/C'd Home per MD order.  AVS completed.   Removed IV-CDI. Reviewed d/c paperwork with patient and wife. Answered all questions. Stable patient and wife walked to main entrance for discharge. Patient/caregiver able to verbalize understanding.  Discharge Medication: Allergies as of 08/15/2021   No Known Allergies      Medication List     STOP taking these medications    cephALEXin 500 MG capsule Commonly known as: KEFLEX       TAKE these medications    amoxicillin-clavulanate 875-125 MG tablet Commonly known as: AUGMENTIN Take 1 tablet by mouth every 12 (twelve) hours.   ARIPiprazole 2 MG tablet Commonly known as: ABILIFY Take 2 mg by mouth daily.   cyclobenzaprine 5 MG tablet Commonly known as: FLEXERIL Take 1 tablet by mouth 3 (three) times daily.   diazepam 10 MG tablet Commonly known as: VALIUM Take 1 tablet (10 mg total) by mouth every 6 (six) hours as needed. What changed: reasons to take this   doxycycline 100 MG tablet Commonly known as: VIBRA-TABS Take 1 tablet (100 mg total) by mouth every 12 (twelve) hours.   escitalopram 20 MG tablet Commonly known as: LEXAPRO Take 1 tablet by mouth daily.   gabapentin 300 MG capsule Commonly known as: NEURONTIN Take 1 capsule by mouth 3 (three) times daily.   hydrOXYzine 25 MG capsule Commonly known as: VISTARIL Take 25 mg by mouth 3 (three) times daily.   insulin lispro 100 UNIT/ML injection Commonly known as: HumaLOG Inject 1-10 Units into the skin 3 (three) times daily before meals. What changed:  how to take this when to take this additional instructions   meloxicam 7.5 MG tablet Commonly known as: MOBIC TAKE 1 TABLET(7.5 MG) BY MOUTH TWICE DAILY What changed: See the new instructions.   oxyCODONE 5 MG immediate release tablet Commonly known as: Oxy IR/ROXICODONE Take 1 tablet (5 mg total) by mouth  every 6 (six) hours as needed for moderate pain.   Silvadene 1 % cream Generic drug: silver sulfADIAZINE Apply 1 Application topically 2 (two) times daily.   traZODone 100 MG tablet Commonly known as: DESYREL Take 100 mg by mouth at bedtime.        Discharge Assessment: Vitals:   08/14/21 2013 08/15/21 0436  BP: 138/84 122/79  Pulse: 90 91  Resp: 20 19  Temp: 98.4 F (36.9 C) 98.2 F (36.8 C)  SpO2: 100% 97%   Skin clean, dry and intact without evidence of skin break down, no evidence of skin tears noted. IV catheter discontinued intact. Site without signs and symptoms of complications - no redness or edema noted at insertion site, patient denies c/o pain - only slight tenderness at site.  Dressing with slight pressure applied.  D/c Instructions-Education: Discharge instructions given to patient/family with verbalized understanding. D/c education completed with patient/family including follow up instructions, medication list, d/c activities limitations if indicated, with other d/c instructions as indicated by MD - patient able to verbalize understanding, all questions fully answered. Patient instructed to return to ED, call 911, or call MD for any changes in condition.  Patient escorted via WC, and D/C home via private auto.  Karolee Ohs, RN 08/15/2021 11:07 AM

## 2021-08-17 LAB — CULTURE, BLOOD (ROUTINE X 2)
Culture: NO GROWTH
Culture: NO GROWTH
Special Requests: ADEQUATE
Special Requests: ADEQUATE

## 2021-08-21 DIAGNOSIS — L03114 Cellulitis of left upper limb: Secondary | ICD-10-CM | POA: Diagnosis not present

## 2021-08-21 DIAGNOSIS — Z79899 Other long term (current) drug therapy: Secondary | ICD-10-CM | POA: Diagnosis not present

## 2021-08-21 DIAGNOSIS — F32A Depression, unspecified: Secondary | ICD-10-CM | POA: Diagnosis not present

## 2021-08-21 DIAGNOSIS — S73192S Other sprain of left hip, sequela: Secondary | ICD-10-CM | POA: Diagnosis not present

## 2021-08-25 ENCOUNTER — Other Ambulatory Visit: Payer: Self-pay

## 2021-08-25 ENCOUNTER — Encounter (HOSPITAL_COMMUNITY): Payer: Self-pay

## 2021-08-25 ENCOUNTER — Inpatient Hospital Stay (HOSPITAL_COMMUNITY)
Admission: EM | Admit: 2021-08-25 | Discharge: 2021-08-27 | DRG: 638 | Disposition: A | Discharge status: Home / Self Care | Payer: BC Managed Care – PPO | Attending: Internal Medicine | Admitting: Internal Medicine

## 2021-08-25 DIAGNOSIS — E1110 Type 2 diabetes mellitus with ketoacidosis without coma: Secondary | ICD-10-CM | POA: Diagnosis present

## 2021-08-25 DIAGNOSIS — T361X6A Underdosing of cephalosporins and other beta-lactam antibiotics, initial encounter: Secondary | ICD-10-CM | POA: Diagnosis present

## 2021-08-25 DIAGNOSIS — Z91128 Patient's intentional underdosing of medication regimen for other reason: Secondary | ICD-10-CM

## 2021-08-25 DIAGNOSIS — L03012 Cellulitis of left finger: Secondary | ICD-10-CM | POA: Diagnosis present

## 2021-08-25 DIAGNOSIS — Z91119 Patient's noncompliance with dietary regimen due to unspecified reason: Secondary | ICD-10-CM

## 2021-08-25 DIAGNOSIS — I10 Essential (primary) hypertension: Secondary | ICD-10-CM | POA: Diagnosis not present

## 2021-08-25 DIAGNOSIS — D72829 Elevated white blood cell count, unspecified: Secondary | ICD-10-CM | POA: Diagnosis not present

## 2021-08-25 DIAGNOSIS — F3181 Bipolar II disorder: Secondary | ICD-10-CM | POA: Diagnosis present

## 2021-08-25 DIAGNOSIS — Z8249 Family history of ischemic heart disease and other diseases of the circulatory system: Secondary | ICD-10-CM | POA: Diagnosis not present

## 2021-08-25 DIAGNOSIS — Z91138 Patient's unintentional underdosing of medication regimen for other reason: Secondary | ICD-10-CM | POA: Diagnosis not present

## 2021-08-25 DIAGNOSIS — Z91148 Patient's other noncompliance with medication regimen for other reason: Secondary | ICD-10-CM

## 2021-08-25 DIAGNOSIS — E860 Dehydration: Secondary | ICD-10-CM | POA: Diagnosis not present

## 2021-08-25 DIAGNOSIS — F419 Anxiety disorder, unspecified: Secondary | ICD-10-CM | POA: Diagnosis not present

## 2021-08-25 DIAGNOSIS — T360X6A Underdosing of penicillins, initial encounter: Secondary | ICD-10-CM | POA: Diagnosis not present

## 2021-08-25 DIAGNOSIS — T364X6A Underdosing of tetracyclines, initial encounter: Secondary | ICD-10-CM | POA: Diagnosis not present

## 2021-08-25 DIAGNOSIS — F17220 Nicotine dependence, chewing tobacco, uncomplicated: Secondary | ICD-10-CM | POA: Diagnosis not present

## 2021-08-25 DIAGNOSIS — E1010 Type 1 diabetes mellitus with ketoacidosis without coma: Principal | ICD-10-CM | POA: Diagnosis present

## 2021-08-25 DIAGNOSIS — Z9641 Presence of insulin pump (external) (internal): Secondary | ICD-10-CM | POA: Diagnosis present

## 2021-08-25 DIAGNOSIS — R7989 Other specified abnormal findings of blood chemistry: Secondary | ICD-10-CM

## 2021-08-25 DIAGNOSIS — Z79899 Other long term (current) drug therapy: Secondary | ICD-10-CM | POA: Diagnosis not present

## 2021-08-25 DIAGNOSIS — F39 Unspecified mood [affective] disorder: Secondary | ICD-10-CM | POA: Diagnosis not present

## 2021-08-25 DIAGNOSIS — Z794 Long term (current) use of insulin: Secondary | ICD-10-CM

## 2021-08-25 DIAGNOSIS — R454 Irritability and anger: Secondary | ICD-10-CM | POA: Diagnosis not present

## 2021-08-25 DIAGNOSIS — Z791 Long term (current) use of non-steroidal anti-inflammatories (NSAID): Secondary | ICD-10-CM | POA: Diagnosis not present

## 2021-08-25 DIAGNOSIS — Z89021 Acquired absence of right finger(s): Secondary | ICD-10-CM | POA: Diagnosis not present

## 2021-08-25 DIAGNOSIS — E1065 Type 1 diabetes mellitus with hyperglycemia: Secondary | ICD-10-CM | POA: Diagnosis present

## 2021-08-25 DIAGNOSIS — T383X6A Underdosing of insulin and oral hypoglycemic [antidiabetic] drugs, initial encounter: Secondary | ICD-10-CM | POA: Diagnosis present

## 2021-08-25 DIAGNOSIS — N179 Acute kidney failure, unspecified: Secondary | ICD-10-CM

## 2021-08-25 DIAGNOSIS — Z720 Tobacco use: Secondary | ICD-10-CM | POA: Diagnosis present

## 2021-08-25 LAB — CBC WITH DIFFERENTIAL/PLATELET
Abs Immature Granulocytes: 0.07 10*3/uL (ref 0.00–0.07)
Basophils Absolute: 0.1 10*3/uL (ref 0.0–0.1)
Basophils Relative: 1 %
Eosinophils Absolute: 0 10*3/uL (ref 0.0–0.5)
Eosinophils Relative: 0 %
HCT: 45.8 % (ref 39.0–52.0)
Hemoglobin: 15.6 g/dL (ref 13.0–17.0)
Immature Granulocytes: 0 %
Lymphocytes Relative: 3 %
Lymphs Abs: 0.5 10*3/uL — ABNORMAL LOW (ref 0.7–4.0)
MCH: 28.7 pg (ref 26.0–34.0)
MCHC: 34.1 g/dL (ref 30.0–36.0)
MCV: 84.2 fL (ref 80.0–100.0)
Monocytes Absolute: 0.1 10*3/uL (ref 0.1–1.0)
Monocytes Relative: 1 %
Neutro Abs: 17.8 10*3/uL — ABNORMAL HIGH (ref 1.7–7.7)
Neutrophils Relative %: 95 %
Platelets: 315 10*3/uL (ref 150–400)
RBC: 5.44 MIL/uL (ref 4.22–5.81)
RDW: 12.1 % (ref 11.5–15.5)
WBC: 18.6 10*3/uL — ABNORMAL HIGH (ref 4.0–10.5)
nRBC: 0 % (ref 0.0–0.2)

## 2021-08-25 LAB — URINALYSIS, ROUTINE W REFLEX MICROSCOPIC
Bacteria, UA: NONE SEEN
Bilirubin Urine: NEGATIVE
Glucose, UA: >=500 mg/dL — AB
Hgb urine dipstick: NEGATIVE
Ketones, ur: 80 mg/dL — AB
Leukocytes,Ua: NEGATIVE
Nitrite: NEGATIVE
Protein, ur: NEGATIVE mg/dL
Specific Gravity, Urine: 1.022 (ref 1.005–1.030)
pH: 5 (ref 5.0–8.0)

## 2021-08-25 LAB — CBG MONITORING, ED
Glucose-Capillary: >600 mg/dL (ref 70–99)
Glucose-Capillary: 527 mg/dL (ref 70–99)
Glucose-Capillary: 426 mg/dL — ABNORMAL HIGH (ref 70–99)
Glucose-Capillary: 360 mg/dL — ABNORMAL HIGH (ref 70–99)
Glucose-Capillary: 302 mg/dL — ABNORMAL HIGH (ref 70–99)
Glucose-Capillary: 229 mg/dL — ABNORMAL HIGH (ref 70–99)
Glucose-Capillary: 223 mg/dL — ABNORMAL HIGH (ref 70–99)

## 2021-08-25 LAB — BLOOD GAS, VENOUS
Acid-Base Excess: 1.7 mmol/L (ref 0.0–2.0)
Acid-base deficit: 12.4 mmol/L — ABNORMAL HIGH (ref 0.0–2.0)
Bicarbonate: 15.7 mmol/L — ABNORMAL LOW (ref 20.0–28.0)
Bicarbonate: 26.6 mmol/L (ref 20.0–28.0)
Drawn by: 40708
Drawn by: 7049
FIO2: 21 %
O2 Saturation: 72.2 %
O2 Saturation: 94.8 %
Patient temperature: 36.8
Patient temperature: 36.5
pCO2, Ven: 43 mmHg — ABNORMAL LOW (ref 44–60)
pCO2, Ven: 41 mmHg — ABNORMAL LOW (ref 44–60)
pH, Ven: 7.17 — CL (ref 7.25–7.43)
pH, Ven: 7.42 (ref 7.25–7.43)
pO2, Ven: 45 mmHg (ref 32–45)
pO2, Ven: 67 mmHg — ABNORMAL HIGH (ref 32–45)

## 2021-08-25 LAB — I-STAT CHEM 8, ED
BUN: 32 mg/dL — ABNORMAL HIGH (ref 6–20)
Calcium, Ion: 1.2 mmol/L (ref 1.15–1.40)
Chloride: 95 mmol/L — ABNORMAL LOW (ref 98–111)
Creatinine, Ser: 1.3 mg/dL — ABNORMAL HIGH (ref 0.61–1.24)
Glucose, Bld: 335 mg/dL — ABNORMAL HIGH (ref 70–99)
HCT: 38 % — ABNORMAL LOW (ref 39.0–52.0)
Hemoglobin: 12.9 g/dL — ABNORMAL LOW (ref 13.0–17.0)
Potassium: 4.2 mmol/L (ref 3.5–5.1)
Sodium: 132 mmol/L — ABNORMAL LOW (ref 135–145)
TCO2: 26 mmol/L (ref 22–32)

## 2021-08-25 LAB — BASIC METABOLIC PANEL
Anion gap: 28 — ABNORMAL HIGH (ref 5–15)
BUN: 40 mg/dL — ABNORMAL HIGH (ref 6–20)
CO2: 15 mmol/L — ABNORMAL LOW (ref 22–32)
Calcium: 9.8 mg/dL (ref 8.9–10.3)
Chloride: 86 mmol/L — ABNORMAL LOW (ref 98–111)
Creatinine, Ser: 2.11 mg/dL — ABNORMAL HIGH (ref 0.61–1.24)
GFR, Estimated: 41 mL/min — ABNORMAL LOW (ref >60)
Glucose, Bld: 752 mg/dL (ref 70–99)
Potassium: 5.1 mmol/L (ref 3.5–5.1)
Sodium: 129 mmol/L — ABNORMAL LOW (ref 135–145)

## 2021-08-25 LAB — BETA-HYDROXYBUTYRIC ACID: Beta-Hydroxybutyric Acid: >8 mmol/L — ABNORMAL HIGH (ref 0.05–0.27)

## 2021-08-25 MED ORDER — LACTATED RINGERS IV BOLUS
20.0000 mL/kg | Freq: Once | INTRAVENOUS | Status: AC
  Administered 2021-08-25: 1470 mL via INTRAVENOUS

## 2021-08-25 MED ORDER — DEXTROSE IN LACTATED RINGERS 5 % IV SOLN
INTRAVENOUS | Status: DC
Start: 2021-08-25 — End: 2021-08-26

## 2021-08-25 MED ORDER — INSULIN REGULAR(HUMAN) IN NACL 100-0.9 UT/100ML-% IV SOLN
INTRAVENOUS | Status: DC
Start: 2021-08-25 — End: 2021-08-26
  Administered 2021-08-25: 7.5 [IU]/h via INTRAVENOUS
  Filled 2021-08-25: qty 100

## 2021-08-25 MED ORDER — OXYCODONE HCL 5 MG PO TABS
5.0000 mg | ORAL_TABLET | Freq: Once | ORAL | Status: AC
  Administered 2021-08-25: 5 mg via ORAL
  Filled 2021-08-25: qty 1

## 2021-08-25 MED ORDER — ONDANSETRON HCL 4 MG/2ML IJ SOLN
4.0000 mg | Freq: Once | INTRAMUSCULAR | Status: AC
  Administered 2021-08-25: 4 mg via INTRAVENOUS
  Filled 2021-08-25: qty 2

## 2021-08-25 MED ORDER — LACTATED RINGERS IV SOLN: INTRAVENOUS | Status: DC

## 2021-08-25 MED ORDER — DEXTROSE 50 % IV SOLN: 0.0000 mL | INTRAVENOUS | Status: DC | PRN

## 2021-08-25 MED ORDER — ACETAMINOPHEN 500 MG PO TABS
1000.0000 mg | ORAL_TABLET | Freq: Once | ORAL | Status: AC
  Administered 2021-08-25: 1000 mg via ORAL
  Filled 2021-08-25: qty 2

## 2021-08-25 NOTE — ED Notes (Signed)
Patient appears to be sleeping.  Respirations even and unlabored.  IV sites patent and healthy.  Side rails up x2, call bell within reach.

## 2021-08-25 NOTE — H&P (Incomplete)
NAME:  Eric [name], MRN:  [id number], DOB:  [birth year], LOS: 0 ADMISSION DATE:  08/25/2021, CONSULTATION DATE:  7/14 REFERRING MD:  Dr. Posey Rea, CHIEF COMPLAINT:  DKA   History of Present Illness:  36 year old male with PMH as below, which is significant for uncontrolled diabetes, bipolar disorder, and HTN. He has multiple recent admissions for DKA. Most recently admitted 6/30 for cellulitis of the left hand. Workup found superficial infection only and he was discharged on oral antibiotics. He presented again to [facility] ED with complaints emesis and hyperglycemia x 48 hours. His wife added concern he may be abusing his psychiatric medications as well. Recently had a prescription for benzodiazepines filled on 7/4, which is already empty (7/3 he filled 60 tabs of diazepam 10mg  per EMR). Upon arrival to the ED he was poorly responsive. Laboratory evaluation demonstrated pH 7.17 with BHB > 8 and positive urine ketones. IV fluid and insulin infusion were initiated. ***  Pertinent  Medical History   has a past medical history of Anxiety, Bipolar 2 disorder, major depressive episode (HCC), Depression, Diabetes mellitus, and Hypertension.   Significant Hospital Events: Including procedures, antibiotic start and stop dates in addition to other pertinent events     Interim History / Subjective:  ***  Objective   Blood pressure 129/75, pulse (!) 115, temperature 97.7 F (36.5 C), temperature source Oral, resp. rate 15, height 5\' 9"  (1.753 m), weight 73.5 kg, SpO2 96 %.       No intake or output data in the 24 hours ending 08/25/21 1735 Filed Weights   08/25/21 1552  Weight: 73.5 kg    Examination: General: *** HENT: *** Lungs: *** Cardiovascular: *** Abdomen: *** Extremities: *** Neuro: *** GU: ***  Resolved Hospital Problem list   ***  Assessment & Plan:   *** DKA: known poor control of diabetes P: - Insulin infusion per DKA protocol - s/p 1.5L IVF in the ED. Additional  IVF bolus - LR infusion transition to D5LR once CBG < 250 - Serial BMP, BHB  Benzodiazepine overdose: 60 tabs of Diazepam 10mg  filled 7/3. Bottle now empty per wife P: - Careful airway monitoring, low threshold for intubation  AKI P: - hydrate - Trend BMP / urinary output - Replace electrolytes as indicated - Avoid nephrotoxic agents, ensure adequate renal perfusion   Pseudohyponatremia: corrects to 139 - monitor  Leukocytosis: reactive?; recent cellulitis of left hand started on doxy x7 days on 7/4 P: - trend wbc/fever curve - check pct  Hx of bipolar 2  Best Practice (right click and "Reselect all SmartList Selections" daily)   Diet/type: {diet type:25684} DVT prophylaxis: {anticoagulation (Optional):25687} GI prophylaxis: 08/27/21 Lines: {Central Venous Access:25771} Foley:  {Central Venous Access:25691} Code Status:  {Code Status:26939} Last date of multidisciplinary goals of care discussion [***]  Labs   CBC: Recent Labs  Lab 08/25/21 1613  WBC 18.6*  NEUTROABS 17.8*  HGB 15.6  HCT 45.8  MCV 84.2  PLT 315    Basic Metabolic Panel: Recent Labs  Lab 08/25/21 1613  NA 129*  K 5.1  CL 86*  CO2 15*  GLUCOSE 752*  BUN 40*  CREATININE 2.11*  CALCIUM 9.8   GFR: Estimated Creatinine Clearance: 47.9 mL/min (A) (by C-G formula based on SCr of 2.11 mg/dL (H)). Recent Labs  Lab 08/25/21 1613  WBC 18.6*    Liver Function Tests: No results for input(s): "AST", "ALT", "ALKPHOS", "BILITOT", "PROT", "ALBUMIN" in the last 168 hours. No results for  input(s): "LIPASE", "AMYLASE" in the last 168 hours. No results for input(s): "AMMONIA" in the last 168 hours.  ABG    Component Value Date/Time   PHART 7.178 (LL) 02/01/2011 2145   PCO2ART 23.6 (L) 02/01/2011 2145   PO2ART 123.0 (H) 02/01/2011 2145   HCO3 15.7 (L) 08/25/2021 1613   TCO2 27 03/30/2017 0935   ACIDBASEDEF 12.4 (H) 08/25/2021 1613   O2SAT 72.2 08/25/2021 1613     Coagulation  Profile: No results for input(s): "INR", "PROTIME" in the last 168 hours.  Cardiac Enzymes: No results for input(s): "CKTOTAL", "CKMB", "CKMBINDEX", "TROPONINI" in the last 168 hours.  HbA1C: Hgb A1c MFr Bld  Date/Time Value Ref Range Status  03/30/2017 09:20 AM 8.2 (H) 4.8 - 5.6 % Final    Comment:    (NOTE) Pre diabetes:          5.7%-6.4% Diabetes:              >6.4% Glycemic control for   <7.0% adults with diabetes   02/02/2011 07:19 PM 8.6 (H) <5.7 % Final    Comment:    (NOTE)                                                                       According to the ADA Clinical Practice Recommendations for 2011, when HbA1c is used as a screening test:  >=6.5%   Diagnostic of Diabetes Mellitus           (if abnormal result is confirmed) 5.7-6.4%   Increased risk of developing Diabetes Mellitus References:Diagnosis and Classification of Diabetes Mellitus,Diabetes Care,2011,34(Suppl 1):S62-S69 and Standards of Medical Care in         Diabetes - 2011,Diabetes Care,2011,34 (Suppl 1):S11-S61.    CBG: Recent Labs  Lab 08/25/21 1556  GLUCAP >600*    Review of Systems:   ***  Past Medical History:  He,  has a past medical history of Anxiety, Bipolar 2 disorder, major depressive episode (HCC), Depression, Diabetes mellitus, and Hypertension.   Surgical History:   Past Surgical History:  Procedure Laterality Date   AMPUTATION Right    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   FRACTURE SURGERY     reconstructive left knee surgery   KNEE SURGERY       Social History:   reports that he has never smoked. His smokeless tobacco use includes snuff. He reports current alcohol use. He reports that he does not use drugs.   Family History:  His family history includes Hypertension in his father and mother.   Allergies No Known Allergies   Home Medications  Prior to Admission medications   Medication Sig Start Date End Date Taking? Authorizing Provider  amoxicillin-clavulanate  (AUGMENTIN) 875-125 MG tablet Take 1 tablet by mouth every 12 (twelve) hours. 08/15/21  Yes Tat, Onalee Hua, MD  clobetasol ointment (TEMOVATE) 0.05 % Apply 1 Application topically 2 (two) times daily. 08/21/21  Yes [provider]  cyclobenzaprine (FLEXERIL) 5 MG tablet Take 1 tablet by mouth 3 (three) times daily.   Yes [provider]  diazepam (VALIUM) 10 MG tablet Take 1 tablet (10 mg total) by mouth every 6 (six) hours as needed. Patient taking differently: Take 10 mg by mouth every 6 (six) hours  as needed for anxiety. 07/07/20  Yes Dione Booze, MD  doxycycline (VIBRA-TABS) 100 MG tablet Take 1 tablet (100 mg total) by mouth every 12 (twelve) hours. 08/15/21  Yes Tat, Onalee Hua, MD  escitalopram (LEXAPRO) 20 MG tablet Take 1 tablet by mouth daily. 04/05/20  Yes [provider]  gabapentin (NEURONTIN) 600 MG tablet Take 600 mg by mouth 3 (three) times daily. 08/21/21  Yes [provider]  hydrOXYzine (VISTARIL) 25 MG capsule Take 25 mg by mouth 3 (three) times daily. 07/05/21  Yes [provider]  insulin lispro (HUMALOG) 100 UNIT/ML injection Inject 1-10 Units into the skin 3 (three) times daily before meals. Patient taking differently: 1-10 Units 3 (three) times daily with meals. Based on sliding scale/carb intake-via pump 02/03/11  Yes Elsaid, Hind I, MD  meloxicam (MOBIC) 7.5 MG tablet TAKE 1 TABLET(7.5 MG) BY MOUTH TWICE DAILY Patient taking differently: Take 7.5 mg by mouth daily. 07/27/21  Yes Vickki Hearing, MD  oxyCODONE (OXY IR/ROXICODONE) 5 MG immediate release tablet Take 1 tablet (5 mg total) by mouth every 6 (six) hours as needed for moderate pain. 08/15/21  Yes Tat, Onalee Hua, MD  silver sulfADIAZINE (SILVADENE) 1 % cream Apply 1 Application topically 2 (two) times daily. 07/02/20  Yes [provider]  traZODone (DESYREL) 100 MG tablet Take 100 mg by mouth at bedtime. 07/18/21  Yes [provider]  ARIPiprazole (ABILIFY) 2 MG tablet Take 2  mg by mouth daily. 07/05/21   [provider]  gabapentin (NEURONTIN) 300 MG capsule Take 1 capsule by mouth 3 (three) times daily.    [provider]     Critical care time: ***

## 2021-08-25 NOTE — ED Triage Notes (Addendum)
Pt presents with c/o of feeling light headed and has had vomiting last night, reports cbg greater than 300. Pt   pt states he had 15 units of insulin in parking lot here . Pts wife stated she is concerned he has been abusing his medication

## 2021-08-25 NOTE — ED Provider Notes (Signed)
[facility] EMERGENCY DEPARTMENT Provider Note  CSN: [id number] Arrival date & time: 08/25/21 1532  Chief Complaint(s) Emesis and Hyperglycemia  HPI Eric [name] is a 36 y.o. male with PMH bipolar 2, depression, type 1 diabetes uncontrolled with multiple admissions for DKA, recent hospital admission on 08/11/2021 for left finger cellulitis who presents emergency department for evaluation of emesis and hyperglycemia.  Patient states that he has been feeling gradually worse over the last 48 hours and is having persistent vomiting with associated abdominal pain.  His wife is concerned that the patient is abusing his psychiatric medication and his anger outbursts have been becoming more frequent.  Wife states that since his benzodiazepines were refilled on July 4 he has almost completed this entire prescription.  He states that he will frequently yell and punch walls, and wife is concerned about the safety of her children.  Requesting inpatient psychiatric consult.   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 Authorizing Provider  amoxicillin-clavulanate (AUGMENTIN) 875-125 MG tablet Take 1 tablet by mouth every 12 (twelve) hours. 08/15/21   Catarina Hartshorn, MD  ARIPiprazole (ABILIFY) 2 MG tablet Take 2 mg by mouth daily. 07/05/21   [provider]  cyclobenzaprine (FLEXERIL) 5 MG tablet Take 1 tablet by mouth 3 (three) times daily.    [provider]  diazepam (VALIUM) 10 MG tablet Take 1 tablet (10 mg total) by mouth every 6 (six) hours as needed. Patient taking differently: Take 10 mg by mouth every 6 (six) hours as needed for anxiety. 07/07/20   Dione Booze, MD  doxycycline (VIBRA-TABS) 100 MG tablet Take 1 tablet (100 mg total) by mouth every 12 (twelve) hours. 08/15/21   Catarina Hartshorn, MD  escitalopram (LEXAPRO) 20 MG tablet Take 1 tablet by mouth daily. 04/05/20   [provider]  gabapentin (NEURONTIN) 300 MG capsule Take 1 capsule by mouth 3 (three) times daily.    [provider]  hydrOXYzine (VISTARIL) 25 MG capsule Take 25 mg by mouth 3 (three) times daily. 07/05/21   [provider]  insulin lispro (HUMALOG) 100 UNIT/ML injection Inject 1-10 Units into the skin 3 (three) times daily before meals. Patient taking differently: 1-10 Units 3 (three) times daily with meals. Based on sliding scale/carb intake-via pump 02/03/11   Elsaid, Hind I, MD  meloxicam (MOBIC) 7.5 MG tablet  TAKE 1 TABLET(7.5 MG) BY MOUTH TWICE DAILY Patient taking differently: Take 7.5 mg by mouth daily. 07/27/21   Vickki Hearing, MD  oxyCODONE (OXY IR/ROXICODONE) 5 MG immediate release tablet Take 1 tablet (5 mg total) by mouth every 6 (six) hours as needed for moderate pain. 08/15/21   Catarina Hartshorn, MD  silver sulfADIAZINE (SILVADENE) 1 % cream  Apply 1 Application topically 2 (two) times daily. 07/02/20   [provider]  traZODone (DESYREL) 100 MG tablet Take 100 mg by mouth at bedtime. 07/18/21   [provider]                                                                                                                                    Past Surgical History Past Surgical History:  Procedure Laterality Date   AMPUTATION Right    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   FRACTURE SURGERY     reconstructive left knee surgery   KNEE SURGERY     Family History Family History  Problem Relation Age of Onset   Hypertension Mother    Hypertension Father     Social History Social History   Tobacco Use   Smoking status: Never   Smokeless tobacco: Current    Types: Snuff  Vaping Use   Vaping Use: Never used  Substance Use Topics   Alcohol use: Yes    Comment: occ   Drug use: No   Allergies Patient has no known allergies.  Review of Systems Review of Systems  Gastrointestinal:  Positive for nausea and vomiting.  Psychiatric/Behavioral:  Positive for agitation and behavioral problems.     Physical Exam Vital Signs  I have reviewed the triage vital signs BP 129/75 (BP Location: Right Arm)   Pulse (!) 115   Temp 97.7 F (36.5 C) (Oral)   Resp 15   Ht 5\' 9"  (1.753 m)   Wt 73.5 kg   SpO2 96%   BMI 23.92 kg/m   Physical Exam Constitutional:      General: He is in acute distress.     Appearance: Normal appearance. He is ill-appearing.  HENT:     Head: Normocephalic and atraumatic.     Nose: No congestion or rhinorrhea.  Eyes:     General:        Right eye: No discharge.        Left eye: No discharge.     Extraocular Movements: Extraocular movements intact.     Pupils: Pupils are equal, round, and reactive to light.  Cardiovascular:     Rate and Rhythm: Regular rhythm. Tachycardia present.     Heart sounds: No murmur heard. Pulmonary:     Effort: No respiratory distress.     Breath sounds: No  wheezing or rales.  Abdominal:     General: There is no distension.     Tenderness: There is no abdominal tenderness.  Musculoskeletal:  General: Normal range of motion.     Cervical back: Normal range of motion.  Skin:    General: Skin is warm and dry.     Comments: Improving erythema over the second knuckle on the left  Neurological:     General: No focal deficit present.     Mental Status: He is alert.     ED Results and Treatments Labs (all labs ordered are listed, but only abnormal results are displayed) Labs Reviewed  CBC WITH DIFFERENTIAL/PLATELET - Abnormal; Notable for the following components:      Result Value   WBC 18.6 (*)    Neutro Abs 17.8 (*)    Lymphs Abs 0.5 (*)    All other components within normal limits  BLOOD GAS, VENOUS - Abnormal; Notable for the following components:   pH, Ven 7.17 (*)    pCO2, Ven 43 (*)    Bicarbonate 15.7 (*)    Acid-base deficit 12.4 (*)    All other components within normal limits  CBG MONITORING, ED - Abnormal; Notable for the following components:   Glucose-Capillary >600 (*)    All other components within normal limits  BASIC METABOLIC PANEL  BASIC METABOLIC PANEL  BASIC METABOLIC PANEL  BASIC METABOLIC PANEL  BETA-HYDROXYBUTYRIC ACID  BETA-HYDROXYBUTYRIC ACID  URINALYSIS, ROUTINE W REFLEX MICROSCOPIC  CBG MONITORING, ED                                                                                                                          Radiology No results found.  Pertinent labs & imaging results that were available during my care of the patient were reviewed by me and considered in my medical decision making (see MDM for details).  Medications Ordered in ED Medications  lactated ringers bolus 1,470 mL (1,470 mLs Intravenous New Bag/Given 08/25/21 1619)                                                                                                                                     Procedures .Critical  Care  Performed by: Glendora Score, MD Authorized by: Glendora Score, MD   Critical care provider statement:    Critical care time (minutes):  75   Critical care was necessary to treat or prevent imminent or life-threatening deterioration of the following conditions:  Endocrine crisis   Critical care was time spent personally by me on the  following activities:  Development of treatment plan with patient or surrogate, discussions with consultants, evaluation of patient's response to treatment, examination of patient, ordering and review of laboratory studies, ordering and review of radiographic studies, ordering and performing treatments and interventions, pulse oximetry, re-evaluation of patient's condition and review of old charts   (including critical care time)  Medical Decision Making / ED Course   This patient presents to the ED for concern of hyperglycemia, nausea, vomiting, this involves an extensive number of treatment options, and is a complaint that carries with it a high risk of complications and morbidity.  The differential diagnosis includes DKA, HHS, gastritis, intra-abdominal infection, stress hyperglycemia  MDM: Patient seen emergency room for evaluation of hyperglycemia abdominal pain nausea vomiting.  Physical exam reveals an ill-appearing tachycardic patient with generalized abdominal tenderness and a healing cellulitis in the left hand.  Laboratory evaluation with a leukocytosis to 18.6, glucose 752 with pseudohyponatremia 129, hypochloremia to 86 with a CO2 of 15, BUN 40, creatinine 2.11.  Hydroxybutyrate greater than 8.  pH 7.17.  Insulin drip and fluid resuscitation begun and patient initially admitted to the ICU, but had a prolonged ER stay and over the course of his ER stay appears to have improved greatly.  Thus, a repeat VBG was performed that shows a normalization of his acidosis with a new pH of 7.42 and most recent glucose is 223.  Patient then admitted to the  hospital service here at [facility] for normalization of gap and completion of his DKA treatment.   Additional history obtained: -Additional history obtained from wife -External records from outside source obtained and reviewed including: Chart review including previous notes, labs, imaging, consultation notes   Lab Tests: -I ordered, reviewed, and interpreted labs.   The pertinent results include:   Labs Reviewed  CBC WITH DIFFERENTIAL/PLATELET - Abnormal; Notable for the following components:      Result Value   WBC 18.6 (*)    Neutro Abs 17.8 (*)    Lymphs Abs 0.5 (*)    All other components within normal limits  BLOOD GAS, VENOUS - Abnormal; Notable for the following components:   pH, Ven 7.17 (*)    pCO2, Ven 43 (*)    Bicarbonate 15.7 (*)    Acid-base deficit 12.4 (*)    All other components within normal limits  CBG MONITORING, ED - Abnormal; Notable for the following components:   Glucose-Capillary >600 (*)    All other components within normal limits  BASIC METABOLIC PANEL  BASIC METABOLIC PANEL  BASIC METABOLIC PANEL  BASIC METABOLIC PANEL  BETA-HYDROXYBUTYRIC ACID  BETA-HYDROXYBUTYRIC ACID  URINALYSIS, ROUTINE W REFLEX MICROSCOPIC  CBG MONITORING, ED     Medicines ordered and prescription drug management: Meds ordered this encounter  Medications   lactated ringers bolus 1,470 mL    -I have reviewed the patients home medicines and have made adjustments as needed  Critical interventions Fluid resuscitation, insulin drip  Consultations Obtained: I requested consultation with the intensivist,  and discussed lab and imaging findings as well as pertinent plan - they recommend: Initially ICU admission , but after patient improvement in the emergency department, hospitalist admission  Cardiac Monitoring: The patient was maintained on a cardiac monitor.  I personally viewed and interpreted the cardiac monitored which showed an underlying rhythm of: Sinus  tachycardia, NSR  Social Determinants of Health:  Factors impacting patients care include: Multiple behavioral disturbances at home, possible tampering with his own insulin pump   Reevaluation:  After the interventions noted above, I reevaluated the patient and found that they have :improved  Co morbidities that complicate the patient evaluation  Past Medical History:  Diagnosis Date   Anxiety    Bipolar 2 disorder, major depressive episode (HCC)    Depression    Diabetes mellitus    Hypertension       Dispostion: I considered admission for this patient, and given DKA, patient require hospital admission     Final Clinical Impression(s) / ED Diagnoses Final diagnoses:  None     @PCDICTATION @    Glendora ScoreKommor, Berneta Sconyers, MD 08/25/21 [id number]

## 2021-08-25 NOTE — H&P (Addendum)
History and Physical    Patient: Eric [name]:[id number] DOB: 1985-09-08 DOA: 08/25/2021 DOS: the patient was seen and examined on 08/26/2021 PCP: Roe Rutherford, NP  Patient coming from: Home  Chief Complaint:  Chief Complaint  Patient presents with   Emesis   Hyperglycemia   HPI: AYODEJI THI is a 36 y.o. male with medical history significant of type 1 diabetes mellitus, anxiety, who presents to the emergency department due to elevated blood sugar level.  Patient has had multiple admissions due to similar presentation.  He complained of feeling sick within the last 2 days and endorsed vomiting and abdominal pain. Per ED medical record, wife was concerned that patient was abusing his psychiatric medication and has been having anger outbursts at home more frequently.  Wife was also concerned that diazepam filled on 7/3 (60 tablets of 10 mg of diazepam tablets) was almost finished and that patient usually punch the walls, so, wife was concerned about safety for her children and request inpatient psychiatric consult per ED. Patient was readily admitted from 6/30 - 7/4 due to cellulitis of finger of left hand which was treated with IV vancomycin and cefepime and was discharged home with Augmentin and doxycycline for 7 more days.   ED Course:  In the emergency department, he was tachycardic, other vital signs were within normal range.  Work-up in the ED showed normal CBC except for leukocytosis, BMP showed hyponatremia, potassium 5.1, chloride 86, bicarb 15, glucose 752, BUN 40, creatinine 2.11 (baseline creatinine 0.8-1.1), EGFR 41, anion gap 28.  Beta-hydroxybutyrate > 8.0.  VBG showed pH 7.17, PCO2 43, PO2 45, bicarb 15.7. Tylenol was given, patient was started on insulin drip per Endo tool.  IV Zofran and oxycodone were given.   Review of Systems: Review of systems as noted in the HPI. All other systems reviewed and are negative.   Past Medical History:  Diagnosis Date    Anxiety    Bipolar 2 disorder, major depressive episode (HCC)    Depression    Diabetes mellitus    Hypertension    Past Surgical History:  Procedure Laterality Date   AMPUTATION Right    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   FRACTURE SURGERY     reconstructive left knee surgery   KNEE SURGERY      Social History:  reports that he has never smoked. His smokeless tobacco use includes snuff. He reports current alcohol use. He reports that he does not use drugs.   No Known Allergies  Family History  Problem Relation Age of Onset   Hypertension Mother    Hypertension Father      Prior to Admission medications   Medication Sig Start Date End Date Taking? Authorizing Provider  acetaminophen (TYLENOL) 500 MG tablet Take 1,000 mg by mouth every 6 (six) hours as needed for moderate pain.   Yes [provider]  amoxicillin-clavulanate (AUGMENTIN) 875-125 MG tablet Take 1 tablet by mouth every 12 (twelve) hours. 08/15/21  Yes Tat, Onalee Hua, MD  ARIPiprazole (ABILIFY) 5 MG tablet Take 5 mg by mouth daily.   Yes [provider]  clobetasol ointment (TEMOVATE) 0.05 % Apply 1 Application topically 2 (two) times daily. 08/21/21  Yes [provider]  cyclobenzaprine (FLEXERIL) 5 MG tablet Take 1 tablet by mouth 3 (three) times daily.   Yes [provider]  diazepam (VALIUM) 10 MG tablet Take 1 tablet (10 mg total) by mouth every 6 (six) hours as needed. Patient taking differently: Take  10 mg by mouth every 6 (six) hours as needed for anxiety. 07/07/20  Yes Dione Booze, MD  doxycycline (VIBRA-TABS) 100 MG tablet Take 1 tablet (100 mg total) by mouth every 12 (twelve) hours. 08/15/21  Yes Tat, Onalee Hua, MD  escitalopram (LEXAPRO) 20 MG tablet Take 20 mg by mouth daily. 04/05/20  Yes [provider]  gabapentin (NEURONTIN) 600 MG tablet Take 600 mg by mouth 3 (three) times daily. 08/21/21  Yes [provider]  hydrOXYzine (VISTARIL) 25 MG capsule Take 25 mg by  mouth 3 (three) times daily. 07/05/21  Yes [provider]  insulin lispro (HUMALOG) 100 UNIT/ML injection Inject 1-10 Units into the skin 3 (three) times daily before meals. Patient taking differently: 1-10 Units 3 (three) times daily with meals. Based on sliding scale/carb intake-via pump 02/03/11  Yes Elsaid, Hind I, MD  meloxicam (MOBIC) 7.5 MG tablet TAKE 1 TABLET(7.5 MG) BY MOUTH TWICE DAILY Patient taking differently: Take 7.5 mg by mouth daily. 07/27/21  Yes Vickki Hearing, MD  methocarbamol (ROBAXIN) 750 MG tablet Take 750 mg by mouth 3 (three) times daily.   Yes [provider]  metroNIDAZOLE (FLAGYL) 500 MG tablet Take 500 mg by mouth 3 (three) times daily.   Yes [provider]  Multiple Vitamin (MULTIVITAMIN WITH MINERALS) TABS tablet Take 1 tablet by mouth daily.   Yes [provider]  naproxen (NAPROSYN) 500 MG tablet Take 500 mg by mouth 2 (two) times daily with a meal.   Yes [provider]  ondansetron (ZOFRAN-ODT) 8 MG disintegrating tablet Take 8 mg by mouth every 8 (eight) hours as needed for nausea or vomiting.   Yes [provider]  oxyCODONE (OXY IR/ROXICODONE) 5 MG immediate release tablet Take 1 tablet (5 mg total) by mouth every 6 (six) hours as needed for moderate pain. 08/15/21  Yes Tat, Onalee Hua, MD  oxymetazoline (AFRIN) 0.05 % nasal spray Place 1 spray into both nostrils 2 (two) times daily.   Yes [provider]  Propylene Glycol (SYSTANE COMPLETE OP) Apply 1 drop to eye daily as needed (dry eye).   Yes [provider]  silver sulfADIAZINE (SILVADENE) 1 % cream Apply 1 Application topically 2 (two) times daily. 07/02/20  Yes [provider]  traZODone (DESYREL) 100 MG tablet Take 100 mg by mouth at bedtime. 07/18/21  Yes [provider]  VITAMIN A PO Take 1 capsule by mouth daily.   Yes [provider]  ARIPiprazole (ABILIFY) 2 MG tablet Take 2 mg by mouth daily. Patient not  taking: Reported on 08/25/2021 07/05/21   [provider]  gabapentin (NEURONTIN) 300 MG capsule Take 1 capsule by mouth 3 (three) times daily. Patient not taking: Reported on 08/25/2021    [provider]    Physical Exam: BP 106/74   Pulse 92   Temp 97.7 F (36.5 C) (Oral)   Resp 13   Ht 5\' 9"  (1.753 m)   Wt 73.5 kg   SpO2 93%   BMI 23.92 kg/m   General: 36 y.o. year-old male ill appearing, but in no acute distress.  Alert and oriented x3. HEENT: NCAT, EOMI Neck: Supple, trachea medial Cardiovascular: Regular rate and rhythm with no rubs or gallops.  No thyromegaly or JVD noted.  No lower extremity edema. 2/4 pulses in all 4 extremities. Respiratory: Clear to auscultation with no wheezes or rales. Good inspiratory effort. Abdomen: Soft, nontender nondistended with normal bowel sounds x4 quadrants. 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  No cyanosis, clubbing or edema noted bilaterally Neuro: CN II-XII intact, strength 5/5 x 4, sensation, reflexes intact Skin: No ulcerative lesions noted or rashes Psychiatry: Judgement and insight appear normal. Mood is appropriate for condition and setting          Labs on Admission:  Basic Metabolic Panel: Recent Labs  Lab 08/25/21 1613 08/25/21 2044 08/26/21 0006  NA 129* 132* 135  K 5.1 4.2 4.0  CL 86* 95* 97*  CO2 15*  --  31  GLUCOSE 752* 335* 202*  BUN 40* 32* 31*  CREATININE 2.11* 1.30* 1.25*  CALCIUM 9.8  --  9.0   Liver Function Tests: No results for input(s): "AST", "ALT", "ALKPHOS", "BILITOT", "PROT", "ALBUMIN" in the last 168 hours. No results for input(s): "LIPASE", "AMYLASE" in the last 168 hours. No results for input(s): "AMMONIA" in the last 168 hours. CBC: Recent Labs  Lab 08/25/21 1613 08/25/21 2044  WBC 18.6*  --   NEUTROABS 17.8*  --   HGB 15.6 12.9*  HCT 45.8 38.0*  MCV 84.2  --   PLT 315  --    Cardiac Enzymes: No results for input(s):  "CKTOTAL", "CKMB", "CKMBINDEX", "TROPONINI" in the last 168 hours.  BNP (last 3 results) No results for input(s): "BNP" in the last 8760 hours.  ProBNP (last 3 results) No results for input(s): "PROBNP" in the last 8760 hours.  CBG: Recent Labs  Lab 08/25/21 2040 08/25/21 2151 08/25/21 2307 08/26/21 0010 08/26/21 0112  GLUCAP 302* 229* 223* 206* 199*    Radiological Exams on Admission: No results found.  EKG: I independently viewed the EKG done and my findings are as followed: EKG was not done in the ED  Assessment/Plan Present on Admission:  DKA (diabetic ketoacidosis) (HCC)  Uncontrolled type 1 diabetes mellitus with hyperglycemia, with long-term current use of insulin (HCC)  Dehydration  Tobacco use  Principal Problem:   DKA (diabetic ketoacidosis) (HCC) Active Problems:   Uncontrolled type 1 diabetes mellitus with hyperglycemia, with long-term current use of insulin (HCC)   Dehydration   Tobacco use   Pseudohyponatremia   AKI (acute kidney injury) (HCC)   Leukocytosis   High Anion gap metabolic acidosis secondary to DKA Hyperglycemia secondary to poorly controlled type 1 diabetes mellitus Continue insulin drip, IV LR with IV potassium per DKA protocol Transition IV LR to D5 LR when serum glucose reaches 250mg /dL Continue serial BMP and VBG Continue to monitor for anion gap closure prior to transitioning patient to subcu insulin Continue NPO  Leukocytosis possibly reactive WBC 18.6, continue to monitor WBC with morning labs  Pseudohyponatremia Na 129, rectal sodium based on CBG (of 752) = 139 Continue to monitor sodium levels  Acute kidney injury BUN 40, creatinine 2.11 (baseline creatinine 0.8-1.1) Continue IV hydration Renally adjust medications, avoid nephrotoxic agents/dehydration/hypotension  Reported overuse of prescription medication Patient denies this at bedside bedside Compliance with prescribed medication advised  Tobacco use Patient  counseled on tobacco abuse cessation  Social issues: Consider speaking with wife to determine safety of children at home based on reported concern in HPI prior to patient's discharge.  DVT prophylaxis: Lovenox  Code Status: Full code  Consults: None  Family Communication: None at bedside  Severity of Illness: The appropriate patient status for this patient is OBSERVATION. Observation status is judged to be reasonable and necessary in order to provide the required intensity of service to ensure the patient's safety. The patient's presenting symptoms, physical exam findings,  and initial radiographic and laboratory data in the context of their medical condition is felt to place them at decreased risk for further clinical deterioration. Furthermore, it is anticipated that the patient will be medically stable for discharge from the hospital within 2 midnights of admission.   Author: Frankey Shown, DO 08/26/2021 1:40 AM  For on call review [email or web address].

## 2021-08-25 NOTE — ED Notes (Signed)
Patient has visitor at bedside. 

## 2021-08-25 NOTE — ED Notes (Signed)
Patient reports the oxicodone he was given earlier isn't doing anything for his pain.  DO Adefeso made aware via secure chat.

## 2021-08-25 NOTE — ED Notes (Signed)
[facility] at bedside.  Lab also at bedside to draw VBG.

## 2021-08-25 NOTE — ED Notes (Signed)
Patient rates shoulder pain 6/10 and is requesting pain medicine.  MD Kommor made aware.

## 2021-08-25 NOTE — ED Notes (Signed)
Per MD Kommor, [facility] is no longer accepting and patient will be staying here.

## 2021-08-26 DIAGNOSIS — N179 Acute kidney failure, unspecified: Secondary | ICD-10-CM

## 2021-08-26 DIAGNOSIS — T364X6A Underdosing of tetracyclines, initial encounter: Secondary | ICD-10-CM | POA: Diagnosis present

## 2021-08-26 DIAGNOSIS — E0810 Diabetes mellitus due to underlying condition with ketoacidosis without coma: Secondary | ICD-10-CM

## 2021-08-26 DIAGNOSIS — F3181 Bipolar II disorder: Secondary | ICD-10-CM | POA: Diagnosis present

## 2021-08-26 DIAGNOSIS — T383X6A Underdosing of insulin and oral hypoglycemic [antidiabetic] drugs, initial encounter: Secondary | ICD-10-CM | POA: Diagnosis present

## 2021-08-26 DIAGNOSIS — T361X6A Underdosing of cephalosporins and other beta-lactam antibiotics, initial encounter: Secondary | ICD-10-CM | POA: Diagnosis present

## 2021-08-26 DIAGNOSIS — T360X6A Underdosing of penicillins, initial encounter: Secondary | ICD-10-CM | POA: Diagnosis present

## 2021-08-26 DIAGNOSIS — Z9641 Presence of insulin pump (external) (internal): Secondary | ICD-10-CM | POA: Diagnosis present

## 2021-08-26 DIAGNOSIS — R454 Irritability and anger: Secondary | ICD-10-CM | POA: Diagnosis present

## 2021-08-26 DIAGNOSIS — Z79899 Other long term (current) drug therapy: Secondary | ICD-10-CM | POA: Diagnosis not present

## 2021-08-26 DIAGNOSIS — Z91119 Patient's noncompliance with dietary regimen due to unspecified reason: Secondary | ICD-10-CM | POA: Diagnosis not present

## 2021-08-26 DIAGNOSIS — Z91128 Patient's intentional underdosing of medication regimen for other reason: Secondary | ICD-10-CM | POA: Diagnosis not present

## 2021-08-26 DIAGNOSIS — I10 Essential (primary) hypertension: Secondary | ICD-10-CM | POA: Diagnosis present

## 2021-08-26 DIAGNOSIS — Z91138 Patient's unintentional underdosing of medication regimen for other reason: Secondary | ICD-10-CM | POA: Diagnosis not present

## 2021-08-26 DIAGNOSIS — L03012 Cellulitis of left finger: Secondary | ICD-10-CM | POA: Diagnosis present

## 2021-08-26 DIAGNOSIS — E860 Dehydration: Secondary | ICD-10-CM | POA: Diagnosis present

## 2021-08-26 DIAGNOSIS — E1110 Type 2 diabetes mellitus with ketoacidosis without coma: Secondary | ICD-10-CM | POA: Diagnosis present

## 2021-08-26 DIAGNOSIS — F39 Unspecified mood [affective] disorder: Secondary | ICD-10-CM | POA: Diagnosis not present

## 2021-08-26 DIAGNOSIS — F17220 Nicotine dependence, chewing tobacco, uncomplicated: Secondary | ICD-10-CM | POA: Diagnosis present

## 2021-08-26 DIAGNOSIS — E1010 Type 1 diabetes mellitus with ketoacidosis without coma: Secondary | ICD-10-CM | POA: Diagnosis present

## 2021-08-26 DIAGNOSIS — Z791 Long term (current) use of non-steroidal anti-inflammatories (NSAID): Secondary | ICD-10-CM | POA: Diagnosis not present

## 2021-08-26 DIAGNOSIS — Z89021 Acquired absence of right finger(s): Secondary | ICD-10-CM | POA: Diagnosis not present

## 2021-08-26 DIAGNOSIS — Z794 Long term (current) use of insulin: Secondary | ICD-10-CM | POA: Diagnosis not present

## 2021-08-26 DIAGNOSIS — Z8249 Family history of ischemic heart disease and other diseases of the circulatory system: Secondary | ICD-10-CM | POA: Diagnosis not present

## 2021-08-26 DIAGNOSIS — F419 Anxiety disorder, unspecified: Secondary | ICD-10-CM | POA: Diagnosis present

## 2021-08-26 DIAGNOSIS — Z91148 Patient's other noncompliance with medication regimen for other reason: Secondary | ICD-10-CM | POA: Diagnosis not present

## 2021-08-26 LAB — BASIC METABOLIC PANEL
Anion gap: 7 (ref 5–15)
Anion gap: 9 (ref 5–15)
Anion gap: 9 (ref 5–15)
BUN: 30 mg/dL — ABNORMAL HIGH (ref 6–20)
BUN: 30 mg/dL — ABNORMAL HIGH (ref 6–20)
BUN: 31 mg/dL — ABNORMAL HIGH (ref 6–20)
CO2: 27 mmol/L (ref 22–32)
CO2: 29 mmol/L (ref 22–32)
CO2: 31 mmol/L (ref 22–32)
Calcium: 8.7 mg/dL — ABNORMAL LOW (ref 8.9–10.3)
Calcium: 8.7 mg/dL — ABNORMAL LOW (ref 8.9–10.3)
Calcium: 9 mg/dL (ref 8.9–10.3)
Chloride: 100 mmol/L (ref 98–111)
Chloride: 97 mmol/L — ABNORMAL LOW (ref 98–111)
Chloride: 99 mmol/L (ref 98–111)
Creatinine, Ser: 1.19 mg/dL (ref 0.61–1.24)
Creatinine, Ser: 1.25 mg/dL — ABNORMAL HIGH (ref 0.61–1.24)
Creatinine, Ser: 1.29 mg/dL — ABNORMAL HIGH (ref 0.61–1.24)
GFR, Estimated: >60 mL/min (ref >60)
GFR, Estimated: >60 mL/min (ref >60)
GFR, Estimated: >60 mL/min (ref >60)
Glucose, Bld: 195 mg/dL — ABNORMAL HIGH (ref 70–99)
Glucose, Bld: 202 mg/dL — ABNORMAL HIGH (ref 70–99)
Glucose, Bld: 371 mg/dL — ABNORMAL HIGH (ref 70–99)
Potassium: 4 mmol/L (ref 3.5–5.1)
Potassium: 4 mmol/L (ref 3.5–5.1)
Potassium: 4.8 mmol/L (ref 3.5–5.1)
Sodium: 135 mmol/L (ref 135–145)
Sodium: 135 mmol/L (ref 135–145)
Sodium: 138 mmol/L (ref 135–145)

## 2021-08-26 LAB — GLUCOSE, CAPILLARY
Glucose-Capillary: 364 mg/dL — ABNORMAL HIGH (ref 70–99)
Glucose-Capillary: 289 mg/dL — ABNORMAL HIGH (ref 70–99)
Glucose-Capillary: 265 mg/dL — ABNORMAL HIGH (ref 70–99)
Glucose-Capillary: 286 mg/dL — ABNORMAL HIGH (ref 70–99)
Glucose-Capillary: 201 mg/dL — ABNORMAL HIGH (ref 70–99)

## 2021-08-26 LAB — CBG MONITORING, ED
Glucose-Capillary: 138 mg/dL — ABNORMAL HIGH (ref 70–99)
Glucose-Capillary: 171 mg/dL — ABNORMAL HIGH (ref 70–99)
Glucose-Capillary: 188 mg/dL — ABNORMAL HIGH (ref 70–99)
Glucose-Capillary: 199 mg/dL — ABNORMAL HIGH (ref 70–99)
Glucose-Capillary: 206 mg/dL — ABNORMAL HIGH (ref 70–99)

## 2021-08-26 LAB — BETA-HYDROXYBUTYRIC ACID
Beta-Hydroxybutyric Acid: 0.12 mmol/L (ref 0.05–0.27)
Beta-Hydroxybutyric Acid: 3.17 mmol/L — ABNORMAL HIGH (ref 0.05–0.27)

## 2021-08-26 LAB — CBC
HCT: 32.9 % — ABNORMAL LOW (ref 39.0–52.0)
Hemoglobin: 11.5 g/dL — ABNORMAL LOW (ref 13.0–17.0)
MCH: 28.8 pg (ref 26.0–34.0)
MCHC: 35 g/dL (ref 30.0–36.0)
MCV: 82.5 fL (ref 80.0–100.0)
Platelets: 260 10*3/uL (ref 150–400)
RBC: 3.99 MIL/uL — ABNORMAL LOW (ref 4.22–5.81)
RDW: 12 % (ref 11.5–15.5)
WBC: 10.4 10*3/uL (ref 4.0–10.5)
nRBC: 0 % (ref 0.0–0.2)

## 2021-08-26 LAB — MAGNESIUM: Magnesium: 2.2 mg/dL (ref 1.7–2.4)

## 2021-08-26 LAB — PHOSPHORUS: Phosphorus: 3.5 mg/dL (ref 2.5–4.6)

## 2021-08-26 LAB — APTT: aPTT: 32 seconds (ref 24–36)

## 2021-08-26 LAB — HEMOGLOBIN A1C
Hgb A1c MFr Bld: 10.4 % — ABNORMAL HIGH (ref 4.8–5.6)
Mean Plasma Glucose: 251.78 mg/dL

## 2021-08-26 MED ORDER — OXYCODONE HCL 5 MG PO TABS
5.0000 mg | ORAL_TABLET | Freq: Four times a day (QID) | ORAL | Status: DC | PRN
  Administered 2021-08-26 – 2021-08-27 (×4): 5 mg via ORAL
  Filled 2021-08-26 (×4): qty 1

## 2021-08-26 MED ORDER — VANCOMYCIN HCL IN DEXTROSE 1-5 GM/200ML-% IV SOLN
1000.0000 mg | Freq: Two times a day (BID) | INTRAVENOUS | Status: DC
  Administered 2021-08-27: 1000 mg via INTRAVENOUS
  Filled 2021-08-26: qty 200

## 2021-08-26 MED ORDER — SODIUM CHLORIDE 0.9 % IV SOLN
1.0000 g | INTRAVENOUS | Status: AC
  Administered 2021-08-26 – 2021-08-27 (×2): 1 g via INTRAVENOUS
  Filled 2021-08-26 (×2): qty 10

## 2021-08-26 MED ORDER — INSULIN ASPART 100 UNIT/ML IJ SOLN
0.0000 [IU] | Freq: Every day | INTRAMUSCULAR | Status: DC
  Administered 2021-08-26: 2 [IU] via SUBCUTANEOUS

## 2021-08-26 MED ORDER — ONDANSETRON HCL 4 MG/2ML IJ SOLN: 4.0000 mg | Freq: Four times a day (QID) | INTRAMUSCULAR | Status: DC | PRN

## 2021-08-26 MED ORDER — INSULIN GLARGINE-YFGN 100 UNIT/ML ~~LOC~~ SOLN
8.0000 [IU] | SUBCUTANEOUS | Status: DC
  Administered 2021-08-26: 8 [IU] via SUBCUTANEOUS
  Filled 2021-08-26 (×2): qty 0.08

## 2021-08-26 MED ORDER — INSULIN ASPART 100 UNIT/ML IJ SOLN: 0.0000 [IU] | Freq: Every day | INTRAMUSCULAR | Status: DC

## 2021-08-26 MED ORDER — INSULIN ASPART 100 UNIT/ML IJ SOLN
0.0000 [IU] | Freq: Three times a day (TID) | INTRAMUSCULAR | Status: DC
  Administered 2021-08-26: 9 [IU] via SUBCUTANEOUS

## 2021-08-26 MED ORDER — INSULIN ASPART 100 UNIT/ML IJ SOLN
5.0000 [IU] | Freq: Three times a day (TID) | INTRAMUSCULAR | Status: DC
  Administered 2021-08-26 – 2021-08-27 (×4): 5 [IU] via SUBCUTANEOUS

## 2021-08-26 MED ORDER — LACTATED RINGERS IV SOLN: INTRAVENOUS | Status: DC

## 2021-08-26 MED ORDER — INSULIN ASPART 100 UNIT/ML IJ SOLN: 3.0000 [IU] | Freq: Three times a day (TID) | INTRAMUSCULAR | Status: DC

## 2021-08-26 MED ORDER — VANCOMYCIN HCL 1500 MG/300ML IV SOLN
1500.0000 mg | Freq: Once | INTRAVENOUS | Status: AC
  Administered 2021-08-26: 1500 mg via INTRAVENOUS
  Filled 2021-08-26: qty 300

## 2021-08-26 MED ORDER — INSULIN GLARGINE-YFGN 100 UNIT/ML ~~LOC~~ SOLN
30.0000 [IU] | Freq: Every day | SUBCUTANEOUS | Status: DC
  Administered 2021-08-26: 30 [IU] via SUBCUTANEOUS
  Filled 2021-08-26 (×4): qty 0.3

## 2021-08-26 MED ORDER — INSULIN ASPART 100 UNIT/ML IJ SOLN
0.0000 [IU] | Freq: Three times a day (TID) | INTRAMUSCULAR | Status: DC
  Administered 2021-08-26 (×2): 8 [IU] via SUBCUTANEOUS
  Administered 2021-08-27: 15 [IU] via SUBCUTANEOUS
  Administered 2021-08-27: 8 [IU] via SUBCUTANEOUS

## 2021-08-26 MED ORDER — ENOXAPARIN SODIUM 40 MG/0.4ML IJ SOSY
40.0000 mg | PREFILLED_SYRINGE | INTRAMUSCULAR | Status: DC
  Administered 2021-08-26 – 2021-08-27 (×2): 40 mg via SUBCUTANEOUS
  Filled 2021-08-26 (×2): qty 0.4

## 2021-08-26 MED ORDER — ONDANSETRON HCL 4 MG PO TABS: 4.0000 mg | ORAL_TABLET | Freq: Four times a day (QID) | ORAL | Status: DC | PRN

## 2021-08-26 NOTE — Assessment & Plan Note (Signed)
Restart Abilify 5 mg daily PDMP reviewed as discussed above with diazepam Restart diazepam home dosing Patient states that PCP is in the process of referring him to psychiatry Denies any suicidal or homicidal ideations plans

## 2021-08-26 NOTE — Assessment & Plan Note (Signed)
Cessation discussed 

## 2021-08-26 NOTE — ED Notes (Signed)
Attempted report, RN stated they would call back in 10 minutes.

## 2021-08-26 NOTE — Assessment & Plan Note (Signed)
Improving with IV fluids and treating hyperglycemia

## 2021-08-26 NOTE — Progress Notes (Signed)
PROGRESS NOTE  Eric [name]:[id number] DOB: 1985-03-25 DOA: 08/25/2021 PCP: Roe Rutherford, NP  Brief History:  36 year old male with a history of type 1 diabetes mellitus, anxiety/depression presenting with elevated blood sugars.  The patient endorsed " feeling sick" for approximately the last 2 days with some abdominal pain, nausea, vomiting.  The patient states that he ate extra fruit and drink some [place] punch in addition to some other dietary indiscretions on the evening of 08/24/2021.  He states that he has not been bolusing himself with his insulin pump as he should.  As the patient discussed his diabetes further, it appears that the patient has had a pattern of dietary indiscretion resulting in elevated CBGs despite his insulin pump.  In addition, there was expressed concern on the part of his wife that the patient had been overusing his diazepam.  Upon review of PDMP, the patient filled a prescription of diazepam 10 mg #60 on 09/10/2021.  Apparently, there were only a few pills left in the bottle.  His wife is also expressed that the patient had been having some angry outburst and punching walls.  The patient stated that he had not been taking his medications properly including his Abilify.  Review of the medical record shows that his Abilify dosing was increased to 5 mg daily on 08/21/2021.  Patient states that PCP is currently in the process of trying to make referral to psychiatry.  Notably, the patient was recently mated to the hospital from 08/11/2021 to 08/15/2021 for cellulitis of his left hand.  CT obtained at that time did not reveal any drainable abscess.  He improved clinically on vancomycin and cefepime.  He was discharged home with 7 more days of Augmentin and doxycycline. However, the patient states that he still has " some pills" left in his antibiotic bottles on the day of admission, 08/25/2021.  Therefore, it appears the patient had not been taking his antibiotics  properly which she subsequently endorsed.  He states that overall his left hand is improving but he continues to have some erythema and pain.  In the ED, the patient was noted to have a serum glucose of 752 with anion gap of 28 with elevated BHA.  He was started on insulin drip and IV fluids.  His DKA subsequently resolved, and the patient was transitioned to subcutaneous insulin.      Assessment and Plan: * DKA (diabetic ketoacidosis) (HCC) -patient started on IV insulin with q 1 hour CBG check and q 4 hour BMPs -pt started on aggressive fluid resuscitation -Electrolytes were monitored and repleted -transitioned to [place] insulin once anion gap closed -diet was advanced once anion gap closed -07/05/21 HbA1C--10.5   Uncontrolled type 1 diabetes mellitus with hyperglycemia, with long-term current use of insulin (HCC) After discussion with the patient it appears that the patient has had a pattern of poor compliance with his diet and insulin pump. He has not follow-up with his endocrine physician in nearly a year -Discontinue insulin pump for now -Start injectable insulin -Start Semglee 30 units daily -Start NovoLog 5 units with meals in addition to NovoLog sliding scale -- Restart IV fluids  AKI (acute kidney injury) (HCC) Secondary to volume depletion Baseline creatinine 0.8-1.0 Presented with serum creatinine 2.11 Restart IV fluids  Cellulitis of finger of left hand Overall improving when compared to the time of discharge on 08/15/2021 Residual infection exist on examination --Patient received an injection of ceftriaxone on  his 08/21/2021 appointment with PCP -- As discussed above, patient was not taking antibiotics appropriately -- Start vancomycin and ceftriaxone -- Check CRP, although this may be elevated due to his other acute medical issues presently  Pseudohyponatremia Improving with IV fluids and treating hyperglycemia  Tobacco use Cessation discussed  Mood disorder  (HCC) Restart Abilify 5 mg daily PDMP reviewed as discussed above with diazepam Restart diazepam home dosing Patient states that PCP is in the process of referring him to psychiatry Denies any suicidal or homicidal ideations plans     Family Communication:  no Family at bedside  Consultants:  none  Code Status:  FULL  DVT Prophylaxis: [place] Lovenox   Procedures: As Listed in Progress Note Above  Antibiotics: Vancomycin 7/15>> Ceftriaxone 7/15>>      Subjective: Patient complains of some pain around his left index finger.  It is better than it was 2 weeks ago.  He denies any fevers, chills, chest pain, shortness breath, nausea, vomiting, diarrhea or abdominal pain  Objective: Vitals:   08/26/21 0400 08/26/21 0430 08/26/21 0600 08/26/21 0645  BP: (!) 100/59 108/66 106/86 114/78  Pulse: 91 87 90 86  Resp: 14 14 14 19   Temp:    97.8 F (36.6 C)  TempSrc:    Oral  SpO2: 96% 96% 100% 96%  Weight:      Height:        Intake/Output Summary (Last 24 hours) at 08/26/2021 1235 Last data filed at 08/26/2021 1100 Gross per 24 hour  Intake 120 ml  Output --  Net 120 ml   Weight change:  Exam:  General:  Pt is alert, follows commands appropriately, not in acute distress HEENT: No icterus, No thrush, No neck mass, [place]/AT Cardiovascular: RRR, S1/S2, no rubs, no gallops Respiratory: CTA bilaterally, no wheezing, no crackles, no rhonchi Abdomen: Soft/+BS, non tender, non distended, no guarding Extremities: See pictures of left hand below    Data Reviewed: I have personally reviewed following labs and imaging studies Basic Metabolic Panel: Recent Labs  Lab 08/25/21 1613 08/25/21 2044 08/26/21 0006 08/26/21 0438 08/26/21 0459 08/26/21 0851  NA 129* 132* 135 138  --  135  K 5.1 4.2 4.0 4.0  --  4.8  CL 86* 95* 97* 100  --  99  CO2 15*  --  31 29  --  27  GLUCOSE 752* 335* 202* 195*  --  371*  BUN 40* 32* 31* 30*  --  30*  CREATININE 2.11* 1.30* 1.25* 1.19  --   1.29*  CALCIUM 9.8  --  9.0 8.7*  --  8.7*  MG  --   --   --   --  2.2  --   PHOS  --   --   --   --  3.5  --    Liver Function Tests: No results for input(s): "AST", "ALT", "ALKPHOS", "BILITOT", "PROT", "ALBUMIN" in the last 168 hours. No results for input(s): "LIPASE", "AMYLASE" in the last 168 hours. No results for input(s): "AMMONIA" in the last 168 hours. Coagulation Profile: No results for input(s): "INR", "PROTIME" in the last 168 hours. CBC: Recent Labs  Lab 08/25/21 1613 08/25/21 2044 08/26/21 0459  WBC 18.6*  --  10.4  NEUTROABS 17.8*  --   --   HGB 15.6 12.9* 11.5*  HCT 45.8 38.0* 32.9*  MCV 84.2  --  82.5  PLT 315  --  260   Cardiac Enzymes: No results for input(s): "CKTOTAL", "CKMB", "CKMBINDEX", "TROPONINI"  in the last 168 hours. BNP: Invalid input(s): "POCBNP" CBG: Recent Labs  Lab 08/26/21 0228 08/26/21 0334 08/26/21 0440 08/26/21 0735 08/26/21 1123  GLUCAP 171* 138* 188* 364* 289*   HbA1C: No results for input(s): "HGBA1C" in the last 72 hours. Urine analysis:    Component Value Date/Time   COLORURINE STRAW (A) 08/25/2021 1650   APPEARANCEUR CLEAR 08/25/2021 1650   LABSPEC 1.022 08/25/2021 1650   PHURINE 5.0 08/25/2021 1650   GLUCOSEU >=500 (A) 08/25/2021 1650   HGBUR NEGATIVE 08/25/2021 1650   BILIRUBINUR NEGATIVE 08/25/2021 1650   KETONESUR 80 (A) 08/25/2021 1650   PROTEINUR NEGATIVE 08/25/2021 1650   UROBILINOGEN 0.2 02/15/2013 1533   NITRITE NEGATIVE 08/25/2021 1650   LEUKOCYTESUR NEGATIVE 08/25/2021 1650   Sepsis Labs: @LABRCNTIP (procalcitonin:4,lacticidven:4) )No results found for this or any previous visit (from the past 240 hour(s)).   Scheduled Meds:  enoxaparin (LOVENOX) injection  40 mg Subcutaneous Q24H   insulin aspart  0-15 Units Subcutaneous TID WC   insulin aspart  0-5 Units Subcutaneous QHS   insulin aspart  5 Units Subcutaneous TID WC   insulin glargine-yfgn  30 Units Subcutaneous Daily   Continuous Infusions:   cefTRIAXone (ROCEPHIN)  IV     lactated ringers Stopped (08/25/21 2153)   lactated ringers     vancomycin      Procedures/Studies: CT HAND LEFT W CONTRAST  Result Date: 08/12/2021 CLINICAL DATA:  Hand cellulitis. Concern for abscess and septic arthritis. History of diabetes. EXAM: CT OF THE UPPER LEFT EXTREMITY WITH CONTRAST TECHNIQUE: Multidetector CT imaging of the left hand was performed according to the standard protocol following intravenous contrast administration. RADIATION DOSE REDUCTION: This exam was performed according to the departmental dose-optimization program which includes automated exposure control, adjustment of the mA and/or kV according to patient size and/or use of iterative reconstruction technique. CONTRAST:  147mL OMNIPAQUE IOHEXOL 300 MG/ML  SOLN COMPARISON:  Radiographs same date. FINDINGS: Bones/Joint/Cartilage No evidence of acute fracture, dislocation or bone destruction. The joint spaces are preserved. No erosive changes, large joint effusions or abnormal synovial enhancement identified. Ligaments Suboptimally assessed by CT. Muscles and Tendons No intramuscular fluid collection or abnormal enhancement identified. No significant tenosynovitis identified. Soft tissues As seen on earlier radiographs, there is soft tissue swelling in the radial aspect of the hand with extension into the index finger. No focal fluid collection, soft tissue emphysema or foreign body identified. Atherosclerosis noted in the radial and ulnar arteries. IMPRESSION: 1. Soft tissue swelling in the radial aspect of the hand extending into the index finger, consistent with soft tissue infection (cellulitis). 2. No focal fluid collection, foreign body or soft tissue emphysema identified. 3. No evidence of osteomyelitis or septic arthritis by CT. Electronically Signed   By: Richardean Sale M.D.   On: 08/12/2021 10:44   DG Hand Complete Left  Result Date: 08/12/2021 CLINICAL DATA:  Soft tissue infection  EXAM: LEFT HAND - COMPLETE 3+ VIEW COMPARISON:  None Available. FINDINGS: Considerable soft tissue swelling is noted involving primarily the second digit consistent with the given clinical history. Underlying bony structures are within normal limits. No radiopaque foreign body is noted. IMPRESSION: Swelling in the second digit without acute bony abnormality. Electronically Signed   By: Inez Catalina M.D.   On: 08/12/2021 01:04   DG Chest Port 1 View  Result Date: 08/12/2021 CLINICAL DATA:  Sepsis EXAM: PORTABLE CHEST 1 VIEW COMPARISON:  01/03/2021 FINDINGS: The heart size and mediastinal contours are within normal limits. Both lungs are  clear. The visualized skeletal structures are unremarkable. IMPRESSION: No active disease. Electronically Signed   By: Alcide Clever M.D.   On: 08/12/2021 00:11    Catarina Hartshorn, DO  [facility]  If 7PM-7AM, please contact night-coverage [email or web address] Password [facility] 08/26/2021, 12:35 PM   LOS: 1 day

## 2021-08-26 NOTE — Progress Notes (Signed)
Pharmacy Antibiotic Note  Eric [name] is a 36 y.o. male admitted on 08/25/2021 with cellulitis.  Pharmacy has been consulted for Vancomycin dosing.  Plan: Vancomycin 1000 mg IV Q 12 hrs. Goal AUC 400-550. Expected AUC: 510 SCr used: 1.29, predicted Css min 14.6  Height: 5\' 9"  (175.3 cm) Weight: 73.5 kg (162 lb) IBW/kg (Calculated) : 70.7  Temp (24hrs), Avg:97.9 F (36.6 C), Min:97.7 F (36.5 C), Max:98.1 F (36.7 C)  Recent Labs  Lab 08/25/21 1613 08/25/21 2044 08/26/21 0006 08/26/21 0438 08/26/21 0459 08/26/21 0851  WBC 18.6*  --   --   --  10.4  --   CREATININE 2.11* 1.30* 1.25* 1.19  --  1.29*    Estimated Creatinine Clearance: 78.4 mL/min (A) (by C-G formula based on SCr of 1.29 mg/dL (H)).    No Known Allergies  Antimicrobials this admission: Vancomycin 7/15 >>   Dose adjustments this admission:   Microbiology results:  BCx: pending  UCx: pending   Sputum:    MRSA PCR:   Thank you for allowing pharmacy to be a part of this patient's care.  8/15 A 08/26/2021 1:31 PM

## 2021-08-26 NOTE — ED Notes (Signed)
Patient given graham crackers per request.

## 2021-08-26 NOTE — Assessment & Plan Note (Addendum)
After discussion with the patient it appears that the patient has had a pattern of poor compliance with his diet and insulin pump. He has not follow-up with his endocrine physician in nearly a year -Discontinue insulin pump for now -Start injectable insulin -Start Semglee 30 units daily>>increased to 40 -Start NovoLog 5 units with meals in addition to NovoLog sliding scale -- Restarted IV fluids 7/16--pt wanted to go home back on his insulin pump.  Spouse states that pt is not putting in his carbs for each meal as he should and that in combo with his dietary indiscretion has contributed to his high CBGs -pt and spouse reassure me pt will follow up with his endocrine MD in 1 week -instructed pt to hold off placing his insulin pump x 7/17 am (24 hours) as pt received semglee 7/16 am already prior to d/c

## 2021-08-26 NOTE — Assessment & Plan Note (Addendum)
Overall improving when compared to the time of discharge on 08/15/2021 Residual infection exist on examination --Patient received an injection of ceftriaxone on his 08/21/2021 appointment with PCP -- As discussed above, patient was not taking antibiotics appropriately -- Start vancomycin and ceftriaxone -- d/c home with 5 more days amox/clav and doxy -- spouse stated she will set up [facility] for pt's hand as she works for [employer]

## 2021-08-26 NOTE — Assessment & Plan Note (Signed)
-  patient started on IV insulin with q 1 hour CBG check and q 4 hour BMPs -pt started on aggressive fluid resuscitation -Electrolytes were monitored and repleted -transitioned to [facility] insulin once anion gap closed -diet was advanced once anion gap closed -07/05/21 HbA1C--10.5

## 2021-08-26 NOTE — Progress Notes (Signed)
  Diabetic ketoacidosis Transition to subcu insulin Serum glucose 202,  bicarb 31 She was started on insulin drip, IV LR with IV potassium per DKA protocol in the ED Anion gap already closed, currently at 7 Continue IV D5 LR and maintain blood glucose within 200-250 mg/dL Clear liquid consistent carb diet will be provided Subcu insulin 8 units x 1 will be given Continue IV D5 LR for about 2 hours after subcu insulin and check BMP prior to discontinuing IV drip Notify physician if patient is able to tolerate above without vomiting and patient will be started on basal insulin regimen and sliding scale If patient continues to have nausea/vomiting, patient may require further IV D5 half-normal saline and titrated insulin drip pending better control of nausea/vomiting.

## 2021-08-26 NOTE — Assessment & Plan Note (Addendum)
Secondary to volume depletion Baseline creatinine 0.8-1.0 Presented with serum creatinine 2.11 Restart IV fluids>>improved Serum creatinine 0.99 on day of dc

## 2021-08-26 NOTE — Hospital Course (Signed)
36 year old male with a history of type 1 diabetes mellitus, anxiety/depression presenting with elevated blood sugars.  The patient endorsed " feeling sick" for approximately the last 2 days with some abdominal pain, nausea, vomiting.  The patient states that he ate extra fruit and drink some Hawaiian punch in addition to some other dietary indiscretions on the evening of 08/24/2021.  He states that he has not been bolusing himself with his insulin pump as he should.  As the patient discussed his diabetes further, it appears that the patient has had a pattern of dietary indiscretion resulting in elevated CBGs despite his insulin pump.  In addition, there was expressed concern on the part of his wife that the patient had been overusing his diazepam.  Upon review of PDMP, the patient filled a prescription of diazepam 10 mg #60 on 09/10/2021.  Apparently, there were only a few pills left in the bottle.  His wife is also expressed that the patient had been having some angry outburst and punching walls.  The patient stated that he had not been taking his medications properly including his Abilify.  Review of the medical record shows that his Abilify dosing was increased to 5 mg daily on 08/21/2021.  Patient states that PCP is currently in the process of trying to make referral to psychiatry.  Notably, the patient was recently mated to the hospital from 08/11/2021 to 08/15/2021 for cellulitis of his left hand.  CT obtained at that time did not reveal any drainable abscess.  He improved clinically on vancomycin and cefepime.  He was discharged home with 7 more days of Augmentin and doxycycline. However, the patient states that he still has " some pills" left in his antibiotic bottles on the day of admission, 08/25/2021.  Therefore, it appears the patient had not been taking his antibiotics properly which she subsequently endorsed.  He states that overall his left hand is improving but he continues to have some erythema and  pain.  In the ED, the patient was noted to have a serum glucose of 752 with anion gap of 28 with elevated BHA.  He was started on insulin drip and IV fluids.  His DKA subsequently resolved, and the patient was transitioned to subcutaneous insulin.

## 2021-08-27 DIAGNOSIS — L03012 Cellulitis of left finger: Secondary | ICD-10-CM

## 2021-08-27 DIAGNOSIS — E1010 Type 1 diabetes mellitus with ketoacidosis without coma: Principal | ICD-10-CM

## 2021-08-27 LAB — CBC
HCT: 33.9 % — ABNORMAL LOW (ref 39.0–52.0)
Hemoglobin: 11.6 g/dL — ABNORMAL LOW (ref 13.0–17.0)
MCH: 28.9 pg (ref 26.0–34.0)
MCHC: 34.2 g/dL (ref 30.0–36.0)
MCV: 84.5 fL (ref 80.0–100.0)
Platelets: 229 10*3/uL (ref 150–400)
RBC: 4.01 MIL/uL — ABNORMAL LOW (ref 4.22–5.81)
RDW: 12.4 % (ref 11.5–15.5)
WBC: 6.1 10*3/uL (ref 4.0–10.5)
nRBC: 0 % (ref 0.0–0.2)

## 2021-08-27 LAB — BASIC METABOLIC PANEL
Anion gap: 8 (ref 5–15)
BUN: 18 mg/dL (ref 6–20)
CO2: 31 mmol/L (ref 22–32)
Calcium: 8.8 mg/dL — ABNORMAL LOW (ref 8.9–10.3)
Chloride: 99 mmol/L (ref 98–111)
Creatinine, Ser: 0.99 mg/dL (ref 0.61–1.24)
GFR, Estimated: >60 mL/min (ref >60)
Glucose, Bld: 335 mg/dL — ABNORMAL HIGH (ref 70–99)
Potassium: 4.4 mmol/L (ref 3.5–5.1)
Sodium: 138 mmol/L (ref 135–145)

## 2021-08-27 LAB — GLUCOSE, CAPILLARY
Glucose-Capillary: 369 mg/dL — ABNORMAL HIGH (ref 70–99)
Glucose-Capillary: 278 mg/dL — ABNORMAL HIGH (ref 70–99)
Glucose-Capillary: 270 mg/dL — ABNORMAL HIGH (ref 70–99)

## 2021-08-27 LAB — MAGNESIUM: Magnesium: 2 mg/dL (ref 1.7–2.4)

## 2021-08-27 MED ORDER — AMOXICILLIN-POT CLAVULANATE 875-125 MG PO TABS: 1.0000 | ORAL_TABLET | Freq: Two times a day (BID) | ORAL | Status: DC

## 2021-08-27 MED ORDER — DOXYCYCLINE HYCLATE 100 MG PO TABS: 100.0000 mg | ORAL_TABLET | Freq: Two times a day (BID) | ORAL | Status: DC

## 2021-08-27 MED ORDER — INSULIN GLARGINE-YFGN 100 UNIT/ML ~~LOC~~ SOLN
40.0000 [IU] | Freq: Every day | SUBCUTANEOUS | Status: DC
  Administered 2021-08-27: 40 [IU] via SUBCUTANEOUS
  Filled 2021-08-27 (×2): qty 0.4

## 2021-08-27 MED ORDER — AMOXICILLIN-POT CLAVULANATE 875-125 MG PO TABS: 1.0000 | ORAL_TABLET | Freq: Two times a day (BID) | ORAL | 0 refills | Status: DC

## 2021-08-27 MED ORDER — DOXYCYCLINE HYCLATE 100 MG PO TABS: 100.0000 mg | ORAL_TABLET | Freq: Two times a day (BID) | ORAL | 0 refills | Status: DC

## 2021-08-27 NOTE — Discharge Summary (Signed)
Physician Discharge Summary   Patient: Eric [name] MRN: [id number] DOB: 25-Dec-1985  Admit date:     08/25/2021  Discharge date: 08/27/21  Discharge Physician: Onalee Hua Jaesean Litzau   PCP: Roe Rutherford, NP   Recommendations at discharge:   Please follow up with primary care provider within 1-2 weeks  Please repeat BMP and CBC in one week     Hospital Course: 36 year old male with a history of type 1 diabetes mellitus, anxiety/depression presenting with elevated blood sugars.  The patient endorsed " feeling sick" for approximately the last 2 days with some abdominal pain, nausea, vomiting.  The patient states that he ate extra fruit and drink some Hawaiian punch in addition to some other dietary indiscretions on the evening of 08/24/2021.  He states that he has not been bolusing himself with his insulin pump as he should.  As the patient discussed his diabetes further, it appears that the patient has had a pattern of dietary indiscretion resulting in elevated CBGs despite his insulin pump.  In addition, there was expressed concern on the part of his wife that the patient had been overusing his diazepam.  Upon review of PDMP, the patient filled a prescription of diazepam 10 mg #60 on 09/10/2021.  Apparently, there were only a few pills left in the bottle.  His wife is also expressed that the patient had been having some angry outburst and punching walls.  The patient stated that he had not been taking his medications properly including his Abilify.  Review of the medical record shows that his Abilify dosing was increased to 5 mg daily on 08/21/2021.  Patient states that PCP is currently in the process of trying to make referral to psychiatry.  Notably, the patient was recently mated to the hospital from 08/11/2021 to 08/15/2021 for cellulitis of his left hand.  CT obtained at that time did not reveal any drainable abscess.  He improved clinically on vancomycin and cefepime.  He was discharged home with 7 more  days of Augmentin and doxycycline. However, the patient states that he still has " some pills" left in his antibiotic bottles on the day of admission, 08/25/2021.  Therefore, it appears the patient had not been taking his antibiotics properly which she subsequently endorsed.  He states that overall his left hand is improving but he continues to have some erythema and pain.  In the ED, the patient was noted to have a serum glucose of 752 with anion gap of 28 with elevated BHA.  He was started on insulin drip and IV fluids.  His DKA subsequently resolved, and the patient was transitioned to subcutaneous insulin.   Assessment and Plan: * DKA (diabetic ketoacidosis) (HCC) -patient started on IV insulin with q 1 hour CBG check and q 4 hour BMPs -pt started on aggressive fluid resuscitation -Electrolytes were monitored and repleted -transitioned to  insulin once anion gap closed -diet was advanced once anion gap closed -07/05/21 HbA1C--10.5   Uncontrolled type 1 diabetes mellitus with hyperglycemia, with long-term current use of insulin (HCC) After discussion with the patient it appears that the patient has had a pattern of poor compliance with his diet and insulin pump. He has not follow-up with his endocrine physician in nearly a year -Discontinue insulin pump for now -Start injectable insulin -Start Semglee 30 units daily>>increased to 40 -Start NovoLog 5 units with meals in addition to NovoLog sliding scale -- Restarted IV fluids 7/16--pt wanted to go home back on his insulin pump.  Spouse  states that pt is not putting in his carbs for each meal as he should and that in combo with his dietary indiscretion has contributed to his high CBGs -pt and spouse reassure me pt will follow up with his endocrine MD in 1 week -instructed pt to hold off placing his insulin pump x 7/17 am (24 hours) as pt received semglee 7/16 am already prior to d/c  AKI (acute kidney injury) (HCC) Secondary to volume  depletion Baseline creatinine 0.8-1.0 Presented with serum creatinine 2.11 Restart IV fluids>>improved Serum creatinine 0.99 on day of dc  Cellulitis of finger of left hand Overall improving when compared to the time of discharge on 08/15/2021 Residual infection exist on examination --Patient received an injection of ceftriaxone on his 08/21/2021 appointment with PCP -- As discussed above, patient was not taking antibiotics appropriately -- Start vancomycin and ceftriaxone -- d/c home with 5 more days amox/clav and doxy -- spouse stated she will set up [facility] for pt's hand as she works for [employer]  Pseudohyponatremia Improving with IV fluids and treating hyperglycemia  Tobacco use Cessation discussed  Mood disorder (HCC) Restart Abilify 5 mg daily PDMP reviewed as discussed above with diazepam Restart diazepam home dosing Patient states that PCP is in the process of referring him to psychiatry Denies any suicidal or homicidal ideations plans          Consultants: none Procedures performed: none  Disposition: Home Diet recommendation:  Carb modified diet DISCHARGE MEDICATION: Allergies as of 08/27/2021   No Known Allergies      Medication List     STOP taking these medications    metroNIDAZOLE 500 MG tablet Commonly known as: FLAGYL       TAKE these medications    acetaminophen 500 MG tablet Commonly known as: TYLENOL Take 1,000 mg by mouth every 6 (six) hours as needed for moderate pain.   amoxicillin-clavulanate 875-125 MG tablet Commonly known as: AUGMENTIN Take 1 tablet by mouth every 12 (twelve) hours.   ARIPiprazole 5 MG tablet Commonly known as: ABILIFY Take 5 mg by mouth daily. What changed: Another medication with the same name was removed. Continue taking this medication, and follow the directions you see here.   clobetasol ointment 0.05 % Commonly known as: TEMOVATE Apply 1 Application topically 2 (two) times daily.   cyclobenzaprine  5 MG tablet Commonly known as: FLEXERIL Take 1 tablet by mouth 3 (three) times daily.   diazepam 10 MG tablet Commonly known as: VALIUM Take 1 tablet (10 mg total) by mouth every 6 (six) hours as needed. What changed: reasons to take this   doxycycline 100 MG tablet Commonly known as: VIBRA-TABS Take 1 tablet (100 mg total) by mouth every 12 (twelve) hours.   escitalopram 20 MG tablet Commonly known as: LEXAPRO Take 20 mg by mouth daily.   gabapentin 600 MG tablet Commonly known as: NEURONTIN Take 600 mg by mouth 3 (three) times daily. What changed: Another medication with the same name was removed. Continue taking this medication, and follow the directions you see here.   hydrOXYzine 25 MG capsule Commonly known as: VISTARIL Take 25 mg by mouth 3 (three) times daily.   insulin lispro 100 UNIT/ML injection Commonly known as: HumaLOG Inject 1-10 Units into the skin 3 (three) times daily before meals. What changed:  how to take this when to take this additional instructions   meloxicam 7.5 MG tablet Commonly known as: MOBIC TAKE 1 TABLET(7.5 MG) BY MOUTH TWICE DAILY What changed:  See the new instructions.   methocarbamol 750 MG tablet Commonly known as: ROBAXIN Take 750 mg by mouth 3 (three) times daily.   multivitamin with minerals Tabs tablet Take 1 tablet by mouth daily.   naproxen 500 MG tablet Commonly known as: NAPROSYN Take 500 mg by mouth 2 (two) times daily with a meal.   ondansetron 8 MG disintegrating tablet Commonly known as: ZOFRAN-ODT Take 8 mg by mouth every 8 (eight) hours as needed for nausea or vomiting.   oxyCODONE 5 MG immediate release tablet Commonly known as: Oxy IR/ROXICODONE Take 1 tablet (5 mg total) by mouth every 6 (six) hours as needed for moderate pain.   oxymetazoline 0.05 % nasal spray Commonly known as: AFRIN Place 1 spray into both nostrils 2 (two) times daily.   Silvadene 1 % cream Generic drug: silver sulfADIAZINE Apply  1 Application topically 2 (two) times daily.   SYSTANE COMPLETE OP Apply 1 drop to eye daily as needed (dry eye).   traZODone 100 MG tablet Commonly known as: DESYREL Take 100 mg by mouth at bedtime.   VITAMIN A PO Take 1 capsule by mouth daily.        Discharge Exam: Filed Weights   08/25/21 1552  Weight: 73.5 kg   HEENT:  [place]/AT, No thrush, no icterus CV:  RRR, no rub, no S3, no S4 Lung:  CTA, no wheeze, no rhonchi Abd:  soft/+BS, NT Ext:  No edema, no lymphangitis, no synovitis, no rash;  left index finger with mild erythema without drainage or synovitis   Condition at discharge: stable  The results of significant diagnostics from this hospitalization (including imaging, microbiology, ancillary and laboratory) are listed below for reference.   Imaging Studies: CT HAND LEFT W CONTRAST  Result Date: 08/12/2021 CLINICAL DATA:  Hand cellulitis. Concern for abscess and septic arthritis. History of diabetes. EXAM: CT OF THE UPPER LEFT EXTREMITY WITH CONTRAST TECHNIQUE: Multidetector CT imaging of the left hand was performed according to the standard protocol following intravenous contrast administration. RADIATION DOSE REDUCTION: This exam was performed according to the departmental dose-optimization program which includes automated exposure control, adjustment of the mA and/or kV according to patient size and/or use of iterative reconstruction technique. CONTRAST:  OMNIPAQUE IOHEXOL 300 MG/ML  SOLN COMPARISON:  Radiographs same date. FINDINGS: Bones/Joint/Cartilage No evidence of acute fracture, dislocation or bone destruction. The joint spaces are preserved. No erosive changes, large joint effusions or abnormal synovial enhancement identified. Ligaments Suboptimally assessed by CT. Muscles and Tendons No intramuscular fluid collection or abnormal enhancement identified. No significant tenosynovitis identified. Soft tissues As seen on earlier radiographs, there is soft tissue  swelling in the radial aspect of the hand with extension into the index finger. No focal fluid collection, soft tissue emphysema or foreign body identified. Atherosclerosis noted in the radial and ulnar arteries. IMPRESSION: 1. Soft tissue swelling in the radial aspect of the hand extending into the index finger, consistent with soft tissue infection (cellulitis). 2. No focal fluid collection, foreign body or soft tissue emphysema identified. 3. No evidence of osteomyelitis or septic arthritis by CT. Electronically Signed   By: Carey Bullocks M.D.   On: 08/12/2021 10:44   DG Hand Complete Left  Result Date: 08/12/2021 CLINICAL DATA:  Soft tissue infection EXAM: LEFT HAND - COMPLETE 3+ VIEW COMPARISON:  None Available. FINDINGS: Considerable soft tissue swelling is noted involving primarily the second digit consistent with the given clinical history. Underlying bony structures are within normal limits. No radiopaque  foreign body is noted. IMPRESSION: Swelling in the second digit without acute bony abnormality. Electronically Signed   By: Alcide Clever M.D.   On: 08/12/2021 01:04   DG Chest Port 1 View  Result Date: 08/12/2021 CLINICAL DATA:  Sepsis EXAM: PORTABLE CHEST 1 VIEW COMPARISON:  01/03/2021 FINDINGS: The heart size and mediastinal contours are within normal limits. Both lungs are clear. The visualized skeletal structures are unremarkable. IMPRESSION: No active disease. Electronically Signed   By: Alcide Clever M.D.   On: 08/12/2021 00:11    Microbiology: Results for orders placed or performed during the hospital encounter of 08/11/21  Blood Culture (routine x 2)     Status: None   Collection Time: 08/12/21 12:17 AM   Specimen: Left Antecubital; Blood  Result Value Ref Range Status   Specimen Description   Final    LEFT ANTECUBITAL BOTTLES DRAWN AEROBIC AND ANAEROBIC   Special Requests Blood Culture adequate volume  Final   Culture   Final    NO GROWTH 5 DAYS Performed at [facility], [address] [place], [place] [address]    Report Status 08/17/2021 FINAL  Final  Blood Culture (routine x 2)     Status: None   Collection Time: 08/12/21 12:23 AM   Specimen: Right Antecubital; Blood  Result Value Ref Range Status   Specimen Description   Final    RIGHT ANTECUBITAL BOTTLES DRAWN AEROBIC AND ANAEROBIC   Special Requests Blood Culture adequate volume  Final   Culture   Final    NO GROWTH 5 DAYS Performed at [facility], [address], [place], [place] [address]    Report Status 08/17/2021 FINAL  Final    Labs: CBC: Recent Labs  Lab 08/25/21 1613 08/25/21 2044 08/26/21 0459 08/27/21 0553  WBC 18.6*  --  10.4 6.1  NEUTROABS 17.8*  --   --   --   HGB 15.6 12.9* 11.5* 11.6*  HCT 45.8 38.0* 32.9* 33.9*  MCV 84.2  --  82.5 84.5  PLT 315  --  260 229   Basic Metabolic Panel: Recent Labs  Lab 08/25/21 1613 08/25/21 2044 08/26/21 0006 08/26/21 0438 08/26/21 0459 08/26/21 0851 08/27/21 0553  NA 129* 132* 135 138  --  135 138  K 5.1 4.2 4.0 4.0  --  4.8 4.4  CL 86* 95* 97* 100  --  99 99  CO2 15*  --  31 29  --  27 31  GLUCOSE 752* 335* 202* 195*  --  371* 335*  BUN 40* 32* 31* 30*  --  30* 18  CREATININE 2.11* 1.30* 1.25* 1.19  --  1.29* 0.99  CALCIUM 9.8  --  9.0 8.7*  --  8.7* 8.8*  MG  --   --   --   --  2.2  --  2.0  PHOS  --   --   --   --  3.5  --   --    Liver Function Tests: No results for input(s): "AST", "ALT", "ALKPHOS", "BILITOT", "PROT", "ALBUMIN" in the last 168 hours. CBG: Recent Labs  Lab 08/26/21 1555 08/26/21 1638 08/26/21 2124 08/27/21 0741 08/27/21 1004  GLUCAP 265* 286* 201* 369* 278*    Discharge time spent: greater than 30 minutes.  Signed: Catarina Hartshorn, MD [facility] 08/27/2021

## 2021-08-27 NOTE — Progress Notes (Signed)
Patient alert and verbally oriented, c/o left shoulder pain at bedtime and slept throughout night, wife at beside.

## 2021-09-18 DIAGNOSIS — M7502 Adhesive capsulitis of left shoulder: Secondary | ICD-10-CM | POA: Diagnosis not present

## 2021-09-18 DIAGNOSIS — M25512 Pain in left shoulder: Secondary | ICD-10-CM | POA: Diagnosis not present

## 2021-09-21 ENCOUNTER — Emergency Department (HOSPITAL_COMMUNITY)
Admission: EM | Admit: 2021-09-21 | Discharge: 2021-09-22 | Disposition: A | Discharge status: Home / Self Care | Payer: BC Managed Care – PPO | Attending: Emergency Medicine | Admitting: Emergency Medicine

## 2021-09-21 ENCOUNTER — Other Ambulatory Visit: Payer: Self-pay

## 2021-09-21 ENCOUNTER — Encounter (HOSPITAL_COMMUNITY): Payer: Self-pay

## 2021-09-21 DIAGNOSIS — Y92410 Unspecified street and highway as the place of occurrence of the external cause: Secondary | ICD-10-CM | POA: Diagnosis not present

## 2021-09-21 DIAGNOSIS — R739 Hyperglycemia, unspecified: Secondary | ICD-10-CM | POA: Diagnosis not present

## 2021-09-21 DIAGNOSIS — Y9301 Activity, walking, marching and hiking: Secondary | ICD-10-CM | POA: Insufficient documentation

## 2021-09-21 DIAGNOSIS — E1165 Type 2 diabetes mellitus with hyperglycemia: Secondary | ICD-10-CM | POA: Insufficient documentation

## 2021-09-21 DIAGNOSIS — Z794 Long term (current) use of insulin: Secondary | ICD-10-CM | POA: Insufficient documentation

## 2021-09-21 DIAGNOSIS — F10920 Alcohol use, unspecified with intoxication, uncomplicated: Secondary | ICD-10-CM | POA: Diagnosis not present

## 2021-09-21 DIAGNOSIS — Y907 Blood alcohol level of 200-239 mg/100 ml: Secondary | ICD-10-CM | POA: Diagnosis not present

## 2021-09-21 DIAGNOSIS — W172XXA Fall into hole, initial encounter: Secondary | ICD-10-CM | POA: Diagnosis not present

## 2021-09-21 DIAGNOSIS — R4020 Unspecified coma: Secondary | ICD-10-CM | POA: Diagnosis not present

## 2021-09-21 DIAGNOSIS — F10129 Alcohol abuse with intoxication, unspecified: Secondary | ICD-10-CM | POA: Diagnosis not present

## 2021-09-21 DIAGNOSIS — R4182 Altered mental status, unspecified: Secondary | ICD-10-CM | POA: Diagnosis not present

## 2021-09-21 DIAGNOSIS — R531 Weakness: Secondary | ICD-10-CM | POA: Diagnosis not present

## 2021-09-21 LAB — COMPREHENSIVE METABOLIC PANEL
ALT: 28 U/L (ref 0–44)
AST: 14 U/L — ABNORMAL LOW (ref 15–41)
Albumin: 3.8 g/dL (ref 3.5–5.0)
Alkaline Phosphatase: 110 U/L (ref 38–126)
Anion gap: 13 (ref 5–15)
BUN: 25 mg/dL — ABNORMAL HIGH (ref 6–20)
CO2: 23 mmol/L (ref 22–32)
Calcium: 8.2 mg/dL — ABNORMAL LOW (ref 8.9–10.3)
Chloride: 90 mmol/L — ABNORMAL LOW (ref 98–111)
Creatinine, Ser: 1.23 mg/dL (ref 0.61–1.24)
GFR, Estimated: >60 mL/min (ref >60)
Glucose, Bld: 606 mg/dL (ref 70–99)
Potassium: 4.1 mmol/L (ref 3.5–5.1)
Sodium: 126 mmol/L — ABNORMAL LOW (ref 135–145)
Total Bilirubin: 0.5 mg/dL (ref 0.3–1.2)
Total Protein: 6.5 g/dL (ref 6.5–8.1)

## 2021-09-21 LAB — CBG MONITORING, ED: Glucose-Capillary: 578 mg/dL (ref 70–99)

## 2021-09-21 LAB — CBC WITH DIFFERENTIAL/PLATELET
Abs Immature Granulocytes: 0.02 10*3/uL (ref 0.00–0.07)
Basophils Absolute: 0.1 10*3/uL (ref 0.0–0.1)
Basophils Relative: 1 %
Eosinophils Absolute: 0 10*3/uL (ref 0.0–0.5)
Eosinophils Relative: 1 %
HCT: 35.6 % — ABNORMAL LOW (ref 39.0–52.0)
Hemoglobin: 12.5 g/dL — ABNORMAL LOW (ref 13.0–17.0)
Immature Granulocytes: 0 %
Lymphocytes Relative: 12 %
Lymphs Abs: 1 10*3/uL (ref 0.7–4.0)
MCH: 28.7 pg (ref 26.0–34.0)
MCHC: 35.1 g/dL (ref 30.0–36.0)
MCV: 81.7 fL (ref 80.0–100.0)
Monocytes Absolute: 0.4 10*3/uL (ref 0.1–1.0)
Monocytes Relative: 4 %
Neutro Abs: 7.2 10*3/uL (ref 1.7–7.7)
Neutrophils Relative %: 82 %
Platelets: 239 10*3/uL (ref 150–400)
RBC: 4.36 MIL/uL (ref 4.22–5.81)
RDW: 11.9 % (ref 11.5–15.5)
WBC: 8.7 10*3/uL (ref 4.0–10.5)
nRBC: 0 % (ref 0.0–0.2)

## 2021-09-21 LAB — ETHANOL: Alcohol, Ethyl (B): 206 mg/dL — ABNORMAL HIGH (ref <10)

## 2021-09-21 MED ORDER — SODIUM CHLORIDE 0.9 % IV BOLUS
1000.0000 mL | Freq: Once | INTRAVENOUS | Status: AC
  Administered 2021-09-21: 1000 mL via INTRAVENOUS

## 2021-09-21 MED ORDER — INSULIN ASPART 100 UNIT/ML IJ SOLN
10.0000 [IU] | Freq: Once | INTRAMUSCULAR | Status: AC
Start: 2021-09-21 — End: 2021-09-21
  Administered 2021-09-21: 10 [IU] via INTRAVENOUS
  Filled 2021-09-21: qty 1

## 2021-09-21 NOTE — ED Triage Notes (Addendum)
BIB [facility] for ETOH intoxication. Pt was alert, but intoxicated upon EMS arrival, neighbor called EMS b/c pt fell in ditch while walking up street. Pt has insulin pump. Pt endorses consuming ETOH, CBG was 470 per EMS. Pt admitted to taking valium with EMS, pt denies anything other than ETOH use to this nurse

## 2021-09-21 NOTE — ED Provider Notes (Signed)
[facility] EMERGENCY DEPARTMENT Provider Note   CSN: [id number] Arrival date & time: 09/21/21  2044     History  Chief Complaint  Patient presents with   Altered Mental Status    ETOH intoxication   Alcohol Intoxication    Eric [name] is a 36 y.o. male.   Altered Mental Status Alcohol Intoxication    This patient is a 36 year old male, he is known to have a history of diabetes with an insulin pump.  He was drinking tonight, had a lot of beer, was found stumbling down the street and fell into a neighbor's yard.  They called EMS.  The patient admits to have been drinking, no seizure activity, sugar was noted to be just over 400.  No vomiting, patient has no complaints, easy to awake but falls asleep quickly.  Denies any pain  Home Medications Prior to Admission medications   Medication Sig Start Date End Date Taking? Authorizing Provider  acetaminophen (TYLENOL) 500 MG tablet Take 1,000 mg by mouth every 6 (six) hours as needed for moderate pain.    [provider]  amoxicillin-clavulanate (AUGMENTIN) 875-125 MG tablet Take 1 tablet by mouth every 12 (twelve) hours. 08/27/21   Catarina Hartshorn, MD  ARIPiprazole (ABILIFY) 5 MG tablet Take 5 mg by mouth daily.    [provider]  clobetasol ointment (TEMOVATE) 0.05 % Apply 1 Application topically 2 (two) times daily. 08/21/21   [provider]  cyclobenzaprine (FLEXERIL) 5 MG tablet Take 1 tablet by mouth 3 (three) times daily.    [provider]  diazepam (VALIUM) 10 MG tablet Take 1 tablet (10 mg total) by mouth every 6 (six) hours as needed. Patient taking differently: Take 10 mg by mouth every 6 (six) hours as needed for anxiety. 07/07/20   Dione Booze, MD  doxycycline (VIBRA-TABS) 100 MG tablet Take 1 tablet (100 mg total) by mouth every 12 (twelve) hours. 08/27/21   Catarina Hartshorn, MD  escitalopram (LEXAPRO) 20 MG tablet Take 20 mg by mouth daily. 04/05/20   [provider]  gabapentin  (NEURONTIN) 600 MG tablet Take 600 mg by mouth 3 (three) times daily. 08/21/21   [provider]  hydrOXYzine (VISTARIL) 25 MG capsule Take 25 mg by mouth 3 (three) times daily. 07/05/21   [provider]  insulin lispro (HUMALOG) 100 UNIT/ML injection Inject 1-10 Units into the skin 3 (three) times daily before meals. Patient taking differently: 1-10 Units 3 (three) times daily with meals. Based on sliding scale/carb intake-via pump 02/03/11   Elsaid, Hind I, MD  meloxicam (MOBIC) 7.5 MG tablet TAKE 1 TABLET(7.5 MG) BY MOUTH TWICE DAILY Patient taking differently: Take 7.5 mg by mouth daily. 07/27/21   Vickki Hearing, MD  methocarbamol (ROBAXIN) 750 MG tablet Take 750 mg by mouth 3 (three) times daily.    [provider]  Multiple Vitamin (MULTIVITAMIN WITH MINERALS) TABS tablet Take 1 tablet by mouth daily.    [provider]  naproxen (NAPROSYN) 500 MG tablet Take 500 mg by mouth 2 (two) times daily with a meal.    [provider]  ondansetron (ZOFRAN-ODT) 8 MG disintegrating tablet Take 8 mg by mouth every 8 (eight) hours as needed for nausea or vomiting.    [provider]  oxyCODONE (OXY IR/ROXICODONE) 5 MG immediate release tablet Take 1 tablet (5 mg total) by mouth every 6 (six) hours as needed for moderate pain. 08/15/21   Catarina Hartshorn, MD  oxymetazoline (AFRIN) 0.05 %  nasal spray Place 1 spray into both nostrils 2 (two) times daily.    [provider]  Propylene Glycol (SYSTANE COMPLETE OP) Apply 1 drop to eye daily as needed (dry eye).    [provider]  silver sulfADIAZINE (SILVADENE) 1 % cream Apply 1 Application topically 2 (two) times daily. 07/02/20   [provider]  traZODone (DESYREL) 100 MG tablet Take 100 mg by mouth at bedtime. 07/18/21   [provider]  VITAMIN A PO Take 1 capsule by mouth daily.    [provider]      Allergies    Patient has no known allergies.    Review of  Systems   Review of Systems  All other systems reviewed and are negative.   Physical Exam Updated Vital Signs There were no vitals taken for this visit. Physical Exam Vitals and nursing note reviewed.  Constitutional:      General: He is not in acute distress.    Appearance: He is well-developed.  HENT:     Head: Normocephalic and atraumatic.     Mouth/Throat:     Pharynx: No oropharyngeal exudate.  Eyes:     General: No scleral icterus.       Right eye: No discharge.        Left eye: No discharge.     Conjunctiva/sclera: Conjunctivae normal.     Pupils: Pupils are equal, round, and reactive to light.  Neck:     Thyroid: No thyromegaly.     Vascular: No JVD.  Cardiovascular:     Rate and Rhythm: Normal rate and regular rhythm.     Heart sounds: Normal heart sounds. No murmur heard.    No friction rub. No gallop.  Pulmonary:     Effort: Pulmonary effort is normal. No respiratory distress.     Breath sounds: Normal breath sounds. No wheezing or rales.  Abdominal:     General: Bowel sounds are normal. There is no distension.     Palpations: Abdomen is soft. There is no mass.     Tenderness: There is no abdominal tenderness.  Musculoskeletal:        General: No tenderness. Normal range of motion.     Cervical back: Normal range of motion and neck supple.  Lymphadenopathy:     Cervical: No cervical adenopathy.  Skin:    General: Skin is warm and dry.     Findings: No erythema or rash.  Neurological:     Mental Status: He is alert.     Coordination: Coordination normal.     Comments: Able to follow commands, sleepy and generally weak but no facial droop, no asymmetry, no focal weakness  Psychiatric:        Behavior: Behavior normal.     ED Results / Procedures / Treatments   Labs (all labs ordered are listed, but only abnormal results are displayed) Labs Reviewed - No data to display  EKG None  Radiology No results found.  Procedures Procedures     Medications Ordered in ED Medications - No data to display  ED Course/ Medical Decision Making/ A&P                           Medical Decision Making Amount and/or Complexity of Data Reviewed Labs: ordered. ECG/medicine tests: ordered.  Risk Prescription drug management.   This patient presents to the ED for concern of altered mental status differential diagnosis includes hyperglycemia, DKA, alcohol  intoxication, less likely to be trauma given no signs of trauma on his body    Additional history obtained:  Additional history obtained from electronic medical record as well as the paramedic prehospital record External records from outside source obtained and reviewed including prior visits with admission for cellulitis in June, DKA in July, multiple other visits for minor injuries or hyperglycemic related visits   Lab Tests:  I Ordered, and personally interpreted labs.  The pertinent results include: Metabolic panel and CBC as well as alcohol level hyperglycemic   Medicines ordered and prescription drug management:  I ordered medication including insulinog  for hyperglycemia  Reevaluation of the patient after these medicines showed that the patient improved I have reviewed the patients home medicines and have made adjustments as needed   Problem List / ED Course:  At change of shift, care will be signed out to Dr. Alvino Chapel to follow-up results and disposition accordingly.  Rule out DKA   Social Determinants of Health:             Final Clinical Impression(s) / ED Diagnoses Final diagnoses:  None    Rx / DC Orders ED Discharge Orders     None         Noemi Chapel, MD 09/24/21 0740

## 2021-09-22 LAB — CBG MONITORING, ED
Glucose-Capillary: 385 mg/dL — ABNORMAL HIGH (ref 70–99)
Glucose-Capillary: 400 mg/dL — ABNORMAL HIGH (ref 70–99)
Glucose-Capillary: 403 mg/dL — ABNORMAL HIGH (ref 70–99)

## 2021-09-22 MED ORDER — INSULIN ASPART 100 UNIT/ML IJ SOLN
8.0000 [IU] | Freq: Once | INTRAMUSCULAR | Status: AC
  Administered 2021-09-22: 8 [IU] via INTRAVENOUS
  Filled 2021-09-22: qty 1

## 2021-09-22 MED ORDER — SODIUM CHLORIDE 0.9 % IV BOLUS
1000.0000 mL | Freq: Once | INTRAVENOUS | Status: AC
  Administered 2021-09-22: 1000 mL via INTRAVENOUS

## 2021-09-22 NOTE — ED Notes (Signed)
The RN entered room, noticed blood all over floor, pt removed IV, pt stated that he had to get up to void and that he went in the sink. Advised pt of proper places to go to the bathroom and use of call bell.

## 2021-09-22 NOTE — ED Provider Notes (Addendum)
  Physical Exam  BP (!) 137/96   Pulse 87   Temp 97.9 F (36.6 C) (Oral)   Resp 18   Ht 5\' 9"  (1.753 m)   Wt 73.5 kg   SpO2 98%   BMI 23.93 kg/m   Physical Exam  Procedures  Procedures  ED Course / MDM    Medical Decision Making Amount and/or Complexity of Data Reviewed Labs: ordered. ECG/medicine tests: ordered.  Risk Prescription drug management.   Received patient in signout.  Mental status change with alcohol intoxication.  Also found to be hyperglycemic without DKA.  Have given insulin numerous times to help bring it down.  Mental status now improving.  Awake and talking.  Should be able to discharge home.  Outpatient follow-up with PCP as needed  Discussed with patient.  Sugar is 400.  States he can manage this at home.  Will discharge home       , MD 09/22/21 11/22/21    [id number], MD 09/22/21 [phone]

## 2021-09-26 DIAGNOSIS — M7502 Adhesive capsulitis of left shoulder: Secondary | ICD-10-CM | POA: Diagnosis not present

## 2021-09-29 DIAGNOSIS — M9907 Segmental and somatic dysfunction of upper extremity: Secondary | ICD-10-CM | POA: Diagnosis not present

## 2021-09-29 DIAGNOSIS — M25612 Stiffness of left shoulder, not elsewhere classified: Secondary | ICD-10-CM | POA: Diagnosis not present

## 2021-09-29 DIAGNOSIS — M7502 Adhesive capsulitis of left shoulder: Secondary | ICD-10-CM | POA: Diagnosis not present

## 2021-09-30 DIAGNOSIS — E109 Type 1 diabetes mellitus without complications: Secondary | ICD-10-CM | POA: Diagnosis not present

## 2021-10-02 DIAGNOSIS — E109 Type 1 diabetes mellitus without complications: Secondary | ICD-10-CM | POA: Diagnosis not present

## 2021-10-02 DIAGNOSIS — Z794 Long term (current) use of insulin: Secondary | ICD-10-CM | POA: Diagnosis not present

## 2021-10-03 DIAGNOSIS — M25612 Stiffness of left shoulder, not elsewhere classified: Secondary | ICD-10-CM | POA: Diagnosis not present

## 2021-10-03 DIAGNOSIS — M9907 Segmental and somatic dysfunction of upper extremity: Secondary | ICD-10-CM | POA: Diagnosis not present

## 2021-10-03 DIAGNOSIS — M7502 Adhesive capsulitis of left shoulder: Secondary | ICD-10-CM | POA: Diagnosis not present

## 2021-10-05 DIAGNOSIS — E109 Type 1 diabetes mellitus without complications: Secondary | ICD-10-CM | POA: Diagnosis not present

## 2021-10-06 DIAGNOSIS — M7502 Adhesive capsulitis of left shoulder: Secondary | ICD-10-CM | POA: Diagnosis not present

## 2021-10-06 DIAGNOSIS — M9907 Segmental and somatic dysfunction of upper extremity: Secondary | ICD-10-CM | POA: Diagnosis not present

## 2021-10-06 DIAGNOSIS — M25612 Stiffness of left shoulder, not elsewhere classified: Secondary | ICD-10-CM | POA: Diagnosis not present

## 2021-10-09 DIAGNOSIS — M25612 Stiffness of left shoulder, not elsewhere classified: Secondary | ICD-10-CM | POA: Diagnosis not present

## 2021-10-09 DIAGNOSIS — M7502 Adhesive capsulitis of left shoulder: Secondary | ICD-10-CM | POA: Diagnosis not present

## 2021-10-09 DIAGNOSIS — M9907 Segmental and somatic dysfunction of upper extremity: Secondary | ICD-10-CM | POA: Diagnosis not present

## 2021-10-13 DIAGNOSIS — M25612 Stiffness of left shoulder, not elsewhere classified: Secondary | ICD-10-CM | POA: Diagnosis not present

## 2021-10-13 DIAGNOSIS — M7502 Adhesive capsulitis of left shoulder: Secondary | ICD-10-CM | POA: Diagnosis not present

## 2021-10-13 DIAGNOSIS — M9907 Segmental and somatic dysfunction of upper extremity: Secondary | ICD-10-CM | POA: Diagnosis not present

## 2021-11-01 DIAGNOSIS — E109 Type 1 diabetes mellitus without complications: Secondary | ICD-10-CM | POA: Diagnosis not present

## 2021-11-01 DIAGNOSIS — Z794 Long term (current) use of insulin: Secondary | ICD-10-CM | POA: Diagnosis not present

## 2021-11-13 ENCOUNTER — Other Ambulatory Visit: Payer: Self-pay | Admitting: Orthopedic Surgery

## 2021-11-13 DIAGNOSIS — G8929 Other chronic pain: Secondary | ICD-10-CM

## 2021-11-22 DIAGNOSIS — F32A Depression, unspecified: Secondary | ICD-10-CM | POA: Diagnosis not present

## 2021-11-22 DIAGNOSIS — Z79899 Other long term (current) drug therapy: Secondary | ICD-10-CM | POA: Diagnosis not present

## 2021-11-22 DIAGNOSIS — F411 Generalized anxiety disorder: Secondary | ICD-10-CM | POA: Diagnosis not present

## 2021-11-22 DIAGNOSIS — M5136 Other intervertebral disc degeneration, lumbar region: Secondary | ICD-10-CM | POA: Diagnosis not present

## 2021-11-22 DIAGNOSIS — E1065 Type 1 diabetes mellitus with hyperglycemia: Secondary | ICD-10-CM | POA: Diagnosis not present

## 2021-11-22 DIAGNOSIS — I10 Essential (primary) hypertension: Secondary | ICD-10-CM | POA: Diagnosis not present

## 2021-12-01 DIAGNOSIS — Z794 Long term (current) use of insulin: Secondary | ICD-10-CM | POA: Diagnosis not present

## 2021-12-01 DIAGNOSIS — E109 Type 1 diabetes mellitus without complications: Secondary | ICD-10-CM | POA: Diagnosis not present

## 2021-12-05 ENCOUNTER — Emergency Department (HOSPITAL_COMMUNITY)
Admission: EM | Admit: 2021-12-05 | Discharge: 2021-12-06 | Disposition: A | Discharge status: Home / Self Care | Payer: BC Managed Care – PPO | Attending: Emergency Medicine | Admitting: Emergency Medicine

## 2021-12-05 ENCOUNTER — Encounter (HOSPITAL_COMMUNITY): Payer: Self-pay

## 2021-12-05 ENCOUNTER — Other Ambulatory Visit: Payer: Self-pay

## 2021-12-05 DIAGNOSIS — I10 Essential (primary) hypertension: Secondary | ICD-10-CM | POA: Insufficient documentation

## 2021-12-05 DIAGNOSIS — Z79899 Other long term (current) drug therapy: Secondary | ICD-10-CM | POA: Insufficient documentation

## 2021-12-05 DIAGNOSIS — Z794 Long term (current) use of insulin: Secondary | ICD-10-CM | POA: Insufficient documentation

## 2021-12-05 DIAGNOSIS — E119 Type 2 diabetes mellitus without complications: Secondary | ICD-10-CM | POA: Insufficient documentation

## 2021-12-05 DIAGNOSIS — R600 Localized edema: Secondary | ICD-10-CM

## 2021-12-05 DIAGNOSIS — Z7984 Long term (current) use of oral hypoglycemic drugs: Secondary | ICD-10-CM | POA: Insufficient documentation

## 2021-12-05 DIAGNOSIS — R609 Edema, unspecified: Secondary | ICD-10-CM

## 2021-12-05 LAB — CBC
HCT: 43 % (ref 39.0–52.0)
Hemoglobin: 14.8 g/dL (ref 13.0–17.0)
MCH: 28.7 pg (ref 26.0–34.0)
MCHC: 34.4 g/dL (ref 30.0–36.0)
MCV: 83.5 fL (ref 80.0–100.0)
Platelets: 224 10*3/uL (ref 150–400)
RBC: 5.15 MIL/uL (ref 4.22–5.81)
RDW: 13 % (ref 11.5–15.5)
WBC: 8.4 10*3/uL (ref 4.0–10.5)
nRBC: 0 % (ref 0.0–0.2)

## 2021-12-05 NOTE — ED Triage Notes (Signed)
Pt presents with bilateral leg swelling x2 days. Pt states that they are twice the size that they usually are. Endorses pain to both legs. Pt is diabetic.

## 2021-12-06 LAB — COMPREHENSIVE METABOLIC PANEL
ALT: 30 U/L (ref 0–44)
AST: 22 U/L (ref 15–41)
Albumin: 4.3 g/dL (ref 3.5–5.0)
Alkaline Phosphatase: 81 U/L (ref 38–126)
Anion gap: 7 (ref 5–15)
BUN: 21 mg/dL — ABNORMAL HIGH (ref 6–20)
CO2: 28 mmol/L (ref 22–32)
Calcium: 9.4 mg/dL (ref 8.9–10.3)
Chloride: 105 mmol/L (ref 98–111)
Creatinine, Ser: 1.09 mg/dL (ref 0.61–1.24)
GFR, Estimated: >60 mL/min (ref >60)
Glucose, Bld: 47 mg/dL — ABNORMAL LOW (ref 70–99)
Potassium: 3.8 mmol/L (ref 3.5–5.1)
Sodium: 140 mmol/L (ref 135–145)
Total Bilirubin: 0.7 mg/dL (ref 0.3–1.2)
Total Protein: 7.1 g/dL (ref 6.5–8.1)

## 2021-12-06 LAB — CBG MONITORING, ED
Glucose-Capillary: 37 mg/dL — CL (ref 70–99)
Glucose-Capillary: 53 mg/dL — ABNORMAL LOW (ref 70–99)
Glucose-Capillary: 92 mg/dL (ref 70–99)

## 2021-12-06 MED ORDER — FUROSEMIDE 20 MG PO TABS: 20.0000 mg | ORAL_TABLET | Freq: Every day | ORAL | 0 refills | Status: DC

## 2021-12-06 NOTE — ED Notes (Signed)
Pt given bagged lunch and diet drink. Pt A&Ox4.

## 2021-12-06 NOTE — ED Provider Notes (Signed)
[name] EMERGENCY DEPARTMENT Provider Note   CSN: [id number] Arrival date & time: 12/05/21  2001     History  Chief Complaint  Patient presents with   Joint Swelling    Eric [name] is a 36 y.o. male.  The history is provided by the patient.   Patient with history of diabetes, hypertension presents with bilateral leg swelling for the past 2 days.  No trauma.  He reports the swelling is causing pain.  He does not recall having this before.  No previous history of VTE.  No fevers or vomiting.  No chest pain or shortness of breath No significant weight gain over the past week Patient has a history of psoriasis to his legs. No history of CHF    Home Medications Prior to Admission medications   Medication Sig Start Date End Date Taking? Authorizing Provider  furosemide (LASIX) 20 MG tablet Take 1 tablet (20 mg total) by mouth daily. 12/06/21  Yes Zadie Rhine, MD  acetaminophen (TYLENOL) 500 MG tablet Take 1,000 mg by mouth every 6 (six) hours as needed for moderate pain.    [provider]  ARIPiprazole (ABILIFY) 5 MG tablet Take 5 mg by mouth daily.    [provider]  clobetasol ointment (TEMOVATE) 0.05 % Apply 1 Application topically 2 (two) times daily. 08/21/21   [provider]  cyclobenzaprine (FLEXERIL) 5 MG tablet Take 1 tablet by mouth 3 (three) times daily.    [provider]  diazepam (VALIUM) 10 MG tablet Take 1 tablet (10 mg total) by mouth every 6 (six) hours as needed. Patient taking differently: Take 10 mg by mouth every 6 (six) hours as needed for anxiety. 07/07/20   Dione Booze, MD  doxycycline (VIBRA-TABS) 100 MG tablet Take 1 tablet (100 mg total) by mouth every 12 (twelve) hours. 08/27/21   Catarina Hartshorn, MD  escitalopram (LEXAPRO) 20 MG tablet Take 20 mg by mouth daily. 04/05/20   [provider]  gabapentin (NEURONTIN) 600 MG tablet Take 600 mg by mouth 3 (three) times daily. 08/21/21   [provider]  hydrOXYzine (VISTARIL) 25 MG capsule Take 25 mg by mouth 3 (three) times daily. 07/05/21   [provider]  insulin lispro (HUMALOG) 100 UNIT/ML injection Inject 1-10 Units into the skin 3 (three) times daily before meals. Patient taking differently: 1-10 Units 3 (three) times daily with meals. Based on sliding scale/carb intake-via pump 02/03/11   Elsaid, Hind I, MD  meloxicam (MOBIC) 7.5 MG tablet Take 1 tablet (7.5 mg total) by mouth daily. 11/13/21   Vickki Hearing, MD  methocarbamol (ROBAXIN) 750 MG tablet Take 750 mg by mouth 3 (three) times daily.    [provider]  Multiple Vitamin (MULTIVITAMIN WITH MINERALS) TABS tablet Take 1 tablet by mouth daily.    [provider]  ondansetron (ZOFRAN-ODT) 8 MG disintegrating tablet Take 8 mg by mouth every 8 (eight) hours as needed for nausea or vomiting.    [provider]  oxymetazoline (AFRIN) 0.05 % nasal spray Place 1 spray into both nostrils 2 (two) times daily.    [provider]  Propylene Glycol (SYSTANE COMPLETE OP) Apply 1 drop to eye daily as needed (dry eye).    [provider]  silver sulfADIAZINE (SILVADENE) 1 % cream Apply 1 Application topically 2 (two) times daily. 07/02/20   [provider]  traZODone (DESYREL) 100 MG tablet Take 100 mg by mouth at bedtime. 07/18/21   [provider]  VITAMIN A PO Take 1 capsule by mouth daily.    [provider]      Allergies    Patient has no known allergies.    Review of Systems   Review of Systems  Constitutional:  Negative for fever and unexpected weight change.  Respiratory:  Negative for shortness of breath.   Cardiovascular:  Positive for leg swelling. Negative for chest pain.    Physical Exam Updated Vital Signs BP (!) 158/91   Pulse 99   Temp 98.3 F (36.8 C) (Oral)   Resp 17   Ht 1.753 m (5\' 9" )   Wt 83.3 kg   SpO2 98%   BMI 27.11 kg/m  Physical Exam CONSTITUTIONAL: Well  developed/well nourished HEAD: Normocephalic/atraumatic EYES: EOMI/PERRL ENMT: Mucous membranes moist NECK: supple no meningeal signs no JVD CV: S1/S2 noted, no murmurs/rubs/gallops noted LUNGS: Lungs are clear to auscultation bilaterally, no apparent distress ABDOMEN: soft, nontender, no abdominal distention NEURO: Pt is awake/alert/appropriate, moves all extremitiesx4.  No facial droop.   EXTREMITIES: pulses normal/equal, full ROM Distal pulses equal and intact.  Symmetric pitting edema noted bilateral lower extremities.  No signs of secondary infection.  Psoriasis noted No puncture wounds noted to the feet SKIN: warm, color normal see photo PSYCH: no abnormalities of mood noted, alert and oriented to situation   ED Results / Procedures / Treatments   Labs (all labs ordered are listed, but only abnormal results are displayed) Labs Reviewed  COMPREHENSIVE METABOLIC PANEL - Abnormal; Notable for the following components:      Result Value   Glucose, Bld 47 (*)    BUN 21 (*)    All other components within normal limits  CBG MONITORING, ED - Abnormal; Notable for the following components:   Glucose-Capillary 37 (*)    All other components within normal limits  CBG MONITORING, ED - Abnormal; Notable for the following components:   Glucose-Capillary 53 (*)    All other components within normal limits  CBC  CBG MONITORING, ED    EKG None  Radiology No results found.  Procedures Procedures    Medications Ordered in ED Medications - No data to display  ED Course/ Medical Decision Making/ A&P Clinical Course as of 12/06/21 0134  Wed Dec 06, 2021  0023 Glucose-Capillary(!!): 37 Hypoglycemia [DW]  0029 Pt had insulin pump on but his monitoring came off, so his glucose dropped.  He will stop his pump and given food to eat  [DW]  0134 Glucose is improving, and patient wishes to be discharged.  He has deactivated his insulin pump and will have it off until his glucose  continues to rebound. [DW]  0134 At this point he is safe for discharge.  I have a suspicion for VTE or CHF.  No signs of cellulitis.  Short course of Lasix will be given.  He already has PCP follow-up next week and advised he will need to have labs redrawn [DW]    Clinical Course User Index [DW] Ripley Fraise, MD                           Medical Decision Making Amount and/or Complexity of Data Reviewed Labs: ordered. Decision-making details documented in ED Course.  Risk Prescription drug management.   This patient presents to the ED for concern of leg swelling, this involves an extensive number of treatment options, and is a complaint that carries with it a high risk of  complications and morbidity.  The differential diagnosis includes but is not limited to anasarca, CHF, VTE, cellulitis, medication reaction  Comorbidities that complicate the patient evaluation: Patient's presentation is complicated by their history of diabetes and hypertension  Social Determinants of Health: Patient's  tobacco use   increases the complexity of managing their presentation  Additional history obtained: Records reviewed Primary Care Documents  Lab Tests: I Ordered, and personally interpreted labs.  The pertinent results include: Hypoglycemia  Imaging Studies ordered: No indication for imaging   Test Considered: Consider further work-up including DVT imaging, but less likely diagnosis of DVT at this time given its both extremities  Reevaluation: After the interventions noted above, I reevaluated the patient and found that they have :improved  Complexity of problems addressed: Patient's presentation is most consistent with  acute complicated illness/injury requiring diagnostic workup  Disposition: After consideration of the diagnostic results and the patient's response to treatment,  I feel that the patent would benefit from discharge   .           Final Clinical Impression(s) /  ED Diagnoses Final diagnoses:  Peripheral edema    Rx / DC Orders ED Discharge Orders          Ordered    furosemide (LASIX) 20 MG tablet  Daily        12/06/21 0129              Zadie Rhine, MD 12/06/21 [phone]

## 2021-12-11 DIAGNOSIS — R2242 Localized swelling, mass and lump, left lower limb: Secondary | ICD-10-CM | POA: Diagnosis not present

## 2022-01-06 ENCOUNTER — Other Ambulatory Visit: Payer: Self-pay

## 2022-01-06 ENCOUNTER — Emergency Department (HOSPITAL_COMMUNITY)
Admission: EM | Admit: 2022-01-06 | Discharge: 2022-01-06 | Disposition: A | Discharge status: Home / Self Care | Payer: Self-pay | Attending: Emergency Medicine | Admitting: Emergency Medicine

## 2022-01-06 DIAGNOSIS — Z79899 Other long term (current) drug therapy: Secondary | ICD-10-CM | POA: Insufficient documentation

## 2022-01-06 DIAGNOSIS — H9202 Otalgia, left ear: Secondary | ICD-10-CM | POA: Insufficient documentation

## 2022-01-06 DIAGNOSIS — Z794 Long term (current) use of insulin: Secondary | ICD-10-CM | POA: Insufficient documentation

## 2022-01-06 DIAGNOSIS — E109 Type 1 diabetes mellitus without complications: Secondary | ICD-10-CM | POA: Insufficient documentation

## 2022-01-06 DIAGNOSIS — J029 Acute pharyngitis, unspecified: Secondary | ICD-10-CM | POA: Insufficient documentation

## 2022-01-06 DIAGNOSIS — I10 Essential (primary) hypertension: Secondary | ICD-10-CM | POA: Insufficient documentation

## 2022-01-06 LAB — CBG MONITORING, ED: Glucose-Capillary: 95 mg/dL (ref 70–99)

## 2022-01-06 MED ORDER — CIPROFLOXACIN-DEXAMETHASONE 0.3-0.1 % OT SUSP
4.0000 [drp] | Freq: Two times a day (BID) | OTIC | Status: DC
  Administered 2022-01-06: 4 [drp] via OTIC
  Filled 2022-01-06: qty 7.5

## 2022-01-06 MED ORDER — AMOXICILLIN-POT CLAVULANATE 875-125 MG PO TABS
1.0000 | ORAL_TABLET | Freq: Once | ORAL | Status: AC
  Administered 2022-01-06: 1 via ORAL
  Filled 2022-01-06: qty 1

## 2022-01-06 MED ORDER — AMOXICILLIN-POT CLAVULANATE 875-125 MG PO TABS: 1.0000 | ORAL_TABLET | Freq: Two times a day (BID) | ORAL | 0 refills | Status: DC

## 2022-01-06 NOTE — ED Triage Notes (Signed)
Pt c/o sore throat that started a few days ago, now says his left ear is hurting.

## 2022-01-06 NOTE — ED Provider Notes (Signed)
[facility] EMERGENCY DEPARTMENT Provider Note   CSN: [id number] Arrival date & time: 01/06/22  3536     History  Chief Complaint  Patient presents with   Otalgia    Eric [name] is a 36 y.o. male.   Otalgia Associated symptoms: hearing loss and sore throat   Patient is a healthy 36 year old male who presents for sore throat and ear pain.  He began to experience sore throat 3 days ago.  He subsequently developed pain in his left ear.  He endorses hearing loss to left ear as well.  He has been utilizing over-the-counter eardrops as well as Q-tips.  He has had a small amount of serous discharge.  He denies any bloody discharge.  He denies any other associated symptoms.     Home Medications Prior to Admission medications   Medication Sig Start Date End Date Taking? Authorizing Provider  amoxicillin-clavulanate (AUGMENTIN) 875-125 MG tablet Take 1 tablet by mouth every 12 (twelve) hours. 01/06/22  Yes Gloris Manchester, MD  acetaminophen (TYLENOL) 500 MG tablet Take 1,000 mg by mouth every 6 (six) hours as needed for moderate pain.    [provider]  ARIPiprazole (ABILIFY) 5 MG tablet Take 5 mg by mouth daily.    [provider]  clobetasol ointment (TEMOVATE) 0.05 % Apply 1 Application topically 2 (two) times daily. 08/21/21   [provider]  cyclobenzaprine (FLEXERIL) 5 MG tablet Take 1 tablet by mouth 3 (three) times daily.    [provider]  diazepam (VALIUM) 10 MG tablet Take 1 tablet (10 mg total) by mouth every 6 (six) hours as needed. Patient taking differently: Take 10 mg by mouth every 6 (six) hours as needed for anxiety. 07/07/20   Dione Booze, MD  escitalopram (LEXAPRO) 20 MG tablet Take 20 mg by mouth daily. 04/05/20   [provider]  furosemide (LASIX) 20 MG tablet Take 1 tablet (20 mg total) by mouth daily. 12/06/21   Zadie Rhine, MD  gabapentin (NEURONTIN) 600 MG tablet Take 600 mg by mouth 3 (three) times daily.  08/21/21   [provider]  hydrOXYzine (VISTARIL) 25 MG capsule Take 25 mg by mouth 3 (three) times daily. 07/05/21   [provider]  insulin lispro (HUMALOG) 100 UNIT/ML injection Inject 1-10 Units into the skin 3 (three) times daily before meals. Patient taking differently: 1-10 Units 3 (three) times daily with meals. Based on sliding scale/carb intake-via pump 02/03/11   Elsaid, Hind I, MD  meloxicam (MOBIC) 7.5 MG tablet Take 1 tablet (7.5 mg total) by mouth daily. 11/13/21   Vickki Hearing, MD  methocarbamol (ROBAXIN) 750 MG tablet Take 750 mg by mouth 3 (three) times daily.    [provider]  Multiple Vitamin (MULTIVITAMIN WITH MINERALS) TABS tablet Take 1 tablet by mouth daily.    [provider]  ondansetron (ZOFRAN-ODT) 8 MG disintegrating tablet Take 8 mg by mouth every 8 (eight) hours as needed for nausea or vomiting.    [provider]  oxymetazoline (AFRIN) 0.05 % nasal spray Place 1 spray into both nostrils 2 (two) times daily.    [provider]  Propylene Glycol (SYSTANE COMPLETE OP) Apply 1 drop to eye daily as needed (dry eye).    [provider]  silver sulfADIAZINE (SILVADENE) 1 % cream Apply 1 Application topically 2 (two) times daily. 07/02/20   [provider]  traZODone (DESYREL) 100 MG tablet Take 100 mg by mouth at bedtime. 07/18/21   [provider]  VITAMIN A PO Take 1 capsule by mouth daily.    [provider]      Allergies    Patient has no known allergies.    Review of Systems   Review of Systems  HENT:  Positive for ear pain, hearing loss and sore throat.   All other systems reviewed and are negative.   Physical Exam Updated Vital Signs BP 138/89 (BP Location: Right Arm)   Pulse 89   Temp 97.9 F (36.6 C) (Oral)   Resp 16   Ht 5\' 9"  (1.753 m)   Wt 84 kg   SpO2 98%   BMI 27.35 kg/m  Physical Exam Vitals and nursing note reviewed.  Constitutional:       General: He is not in acute distress.    Appearance: Normal appearance. He is well-developed. He is not ill-appearing, toxic-appearing or diaphoretic.  HENT:     Head: Normocephalic and atraumatic.     Right Ear: External ear normal. There is impacted cerumen.     Left Ear: External ear normal.     Ears:     Comments: View of left TM is partially obstructed by cerumen.  There does appear to be a small perforation.    Nose: Nose normal. No congestion.     Mouth/Throat:     Mouth: Mucous membranes are moist.     Pharynx: Oropharynx is clear. No oropharyngeal exudate or posterior oropharyngeal erythema.  Eyes:     Extraocular Movements: Extraocular movements intact.     Conjunctiva/sclera: Conjunctivae normal.  Cardiovascular:     Rate and Rhythm: Normal rate and regular rhythm.  Pulmonary:     Effort: Pulmonary effort is normal. No respiratory distress.  Abdominal:     General: There is no distension.     Palpations: Abdomen is soft.  Musculoskeletal:        General: No swelling. Normal range of motion.     Cervical back: Normal range of motion and neck supple.  Skin:    General: Skin is warm and dry.     Coloration: Skin is not jaundiced or pale.  Neurological:     General: No focal deficit present.     Mental Status: He is alert and oriented to person, place, and time.     Cranial Nerves: No cranial nerve deficit.     Sensory: No sensory deficit.     Motor: No weakness.     Coordination: Coordination normal.  Psychiatric:        Mood and Affect: Mood normal.        Behavior: Behavior normal.        Thought Content: Thought content normal.        Judgment: Judgment normal.     ED Results / Procedures / Treatments   Labs (all labs ordered are listed, but only abnormal results are displayed) Labs Reviewed  CBG MONITORING, ED    EKG None  Radiology No results found.  Procedures Procedures    Medications Ordered in ED Medications  ciprofloxacin-dexamethasone  (CIPRODEX) 0.3-0.1 % OTIC (EAR) suspension 4 drop (4 drops Left EAR Given 01/06/22 0802)  amoxicillin-clavulanate (AUGMENTIN) 875-125 MG per tablet 1 tablet (1 tablet Oral Given 01/06/22 0800)    ED Course/ Medical Decision Making/ A&P                           Medical Decision Making Risk Prescription drug management.   Patient  is a healthy 36 year old male presenting for sore throat and left ear pain.  His medical history includes HTN, DM, depression, anxiety, bipolar disorder.  He reports sore throat over the past 3 days.  He also reports subsequent left ear pain followed by hearing loss.  On arrival in the ED, vital signs are notable for moderate hypertension.  He is well-appearing on exam.  On inspection of his throat, there are no areas of swelling, erythema or exudates.  Review of TM on right (asymptomatic) side is obstructed by impacted cerumen.  On inspection of left TM, there is also partial obstruction by cerumen but there does appear to be a small TM perforation.  Given his recent symptoms this could have been secondary to pressure buildup from otitis media.  He denies any overt trauma but does state that he has been using Q-tips.  This may have been caused by trauma from Q-tip use.  Regardless, patient to be prescribed antibiotics with ENT follow-up.  Patient received first doses of Ciprodex eardrops and Augmentin in the ED.  Patient has history of type 1 diabetes.  CBG in the ED was 95.  He was discharged in good condition.        Final Clinical Impression(s) / ED Diagnoses Final diagnoses:  Otalgia of left ear    Rx / DC Orders ED Discharge Orders          Ordered    amoxicillin-clavulanate (AUGMENTIN) 875-125 MG tablet  Every 12 hours        01/06/22 0747              Gloris Manchester, MD 01/06/22 [phone]

## 2022-01-06 NOTE — Discharge Instructions (Addendum)
Continue 4 drops of antibiotic eardrops in left ear twice per day for the next 7 days.  A prescription for oral antibiotics was sent to your pharmacy.  You received your first dose here in the ED.  Continue as prescribed for the next 7 days, starting this evening.  There is a telephone number below to call to set up a follow-up appointment with the ear, nose, and throat doctor.  Schedule a follow-up appointment as soon as possible.  Return to the emergency department at any time for any new or worsening symptoms of concern.

## 2022-01-11 ENCOUNTER — Emergency Department (HOSPITAL_COMMUNITY)
Admission: EM | Admit: 2022-01-11 | Discharge: 2022-01-11 | Disposition: A | Discharge status: Home / Self Care | Payer: Self-pay | Attending: Emergency Medicine | Admitting: Emergency Medicine

## 2022-01-11 ENCOUNTER — Encounter (HOSPITAL_COMMUNITY): Payer: Self-pay

## 2022-01-11 DIAGNOSIS — H6092 Unspecified otitis externa, left ear: Secondary | ICD-10-CM

## 2022-01-11 DIAGNOSIS — E119 Type 2 diabetes mellitus without complications: Secondary | ICD-10-CM | POA: Insufficient documentation

## 2022-01-11 DIAGNOSIS — Z794 Long term (current) use of insulin: Secondary | ICD-10-CM | POA: Insufficient documentation

## 2022-01-11 DIAGNOSIS — H6093 Unspecified otitis externa, bilateral: Secondary | ICD-10-CM | POA: Insufficient documentation

## 2022-01-11 MED ORDER — CIPROFLOXACIN-DEXAMETHASONE 0.3-0.1 % OT SUSP: 4.0000 [drp] | Freq: Two times a day (BID) | OTIC | 0 refills | Status: DC

## 2022-01-11 MED ORDER — HYDROCODONE-ACETAMINOPHEN 5-325 MG PO TABS: 1.0000 | ORAL_TABLET | Freq: Four times a day (QID) | ORAL | 0 refills | Status: DC | PRN

## 2022-01-11 NOTE — ED Triage Notes (Signed)
Pt c/o ongoing L ear pain since being seen earlier in the week. Pt states that he was prescribed 7 day course of ear drop abx, but ran out this morning.

## 2022-01-11 NOTE — ED Provider Notes (Signed)
[facility] EMERGENCY DEPARTMENT Provider Note   CSN: [id number] Arrival date & time: 01/11/22  0973     History  Chief Complaint  Patient presents with   Otalgia    Eric [name] is a 36 y.o. male.  Patient with history of diabetes.  He was seen a few days ago with external otitis and used Ciprodex drops and Augmentin.  He was post to follow-up with ENT but did not.  The history is provided by the patient and medical records. No language interpreter was used.  Otalgia Location:  Bilateral Behind ear:  Redness Quality:  Aching Severity:  Moderate Onset quality:  Sudden Timing:  Constant Progression:  Worsening Chronicity:  Recurrent Relieved by:  Nothing Associated symptoms: no abdominal pain, no congestion, no cough, no diarrhea, no ear discharge, no headaches and no rash        Home Medications Prior to Admission medications   Medication Sig Start Date End Date Taking? Authorizing Provider  ciprofloxacin-dexamethasone (CIPRODEX) OTIC suspension Place 4 drops into the left ear 2 (two) times daily. 01/11/22  Yes Bethann Berkshire, MD  HYDROcodone-acetaminophen (NORCO/VICODIN) 5-325 MG tablet Take 1 tablet by mouth every 6 (six) hours as needed. 01/11/22  Yes Bethann Berkshire, MD  acetaminophen (TYLENOL) 500 MG tablet Take 1,000 mg by mouth every 6 (six) hours as needed for moderate pain.    [provider]  amoxicillin-clavulanate (AUGMENTIN) 875-125 MG tablet Take 1 tablet by mouth every 12 (twelve) hours. 01/06/22   Gloris Manchester, MD  ARIPiprazole (ABILIFY) 5 MG tablet Take 5 mg by mouth daily.    [provider]  clobetasol ointment (TEMOVATE) 0.05 % Apply 1 Application topically 2 (two) times daily. 08/21/21   [provider]  cyclobenzaprine (FLEXERIL) 5 MG tablet Take 1 tablet by mouth 3 (three) times daily.    [provider]  diazepam (VALIUM) 10 MG tablet Take 1 tablet (10 mg total) by mouth every 6 (six) hours as needed. Patient  taking differently: Take 10 mg by mouth every 6 (six) hours as needed for anxiety. 07/07/20   Dione Booze, MD  escitalopram (LEXAPRO) 20 MG tablet Take 20 mg by mouth daily. 04/05/20   [provider]  furosemide (LASIX) 20 MG tablet Take 1 tablet (20 mg total) by mouth daily. 12/06/21   Zadie Rhine, MD  gabapentin (NEURONTIN) 600 MG tablet Take 600 mg by mouth 3 (three) times daily. 08/21/21   [provider]  hydrOXYzine (VISTARIL) 25 MG capsule Take 25 mg by mouth 3 (three) times daily. 07/05/21   [provider]  insulin lispro (HUMALOG) 100 UNIT/ML injection Inject 1-10 Units into the skin 3 (three) times daily before meals. Patient taking differently: 1-10 Units 3 (three) times daily with meals. Based on sliding scale/carb intake-via pump 02/03/11   Elsaid, Hind I, MD  meloxicam (MOBIC) 7.5 MG tablet Take 1 tablet (7.5 mg total) by mouth daily. 11/13/21   Vickki Hearing, MD  methocarbamol (ROBAXIN) 750 MG tablet Take 750 mg by mouth 3 (three) times daily.    [provider]  Multiple Vitamin (MULTIVITAMIN WITH MINERALS) TABS tablet Take 1 tablet by mouth daily.    [provider]  ondansetron (ZOFRAN-ODT) 8 MG disintegrating tablet Take 8 mg by mouth every 8 (eight) hours as needed for nausea or vomiting.    [provider]  oxymetazoline (AFRIN) 0.05 % nasal spray Place 1 spray into both nostrils 2 (two) times daily.    [provider]  Propylene Glycol (SYSTANE COMPLETE OP) Apply 1 drop to eye daily as needed (dry eye).    [provider]  silver sulfADIAZINE (SILVADENE) 1 % cream Apply 1 Application topically 2 (two) times daily. 07/02/20   [provider]  traZODone (DESYREL) 100 MG tablet Take 100 mg by mouth at bedtime. 07/18/21   [provider]  VITAMIN A PO Take 1 capsule by mouth daily.    [provider]      Allergies    Patient has no known allergies.    Review of Systems    Review of Systems  Constitutional:  Negative for appetite change and fatigue.  HENT:  Positive for ear pain. Negative for congestion, ear discharge and sinus pressure.   Eyes:  Negative for discharge.  Respiratory:  Negative for cough.   Cardiovascular:  Negative for chest pain.  Gastrointestinal:  Negative for abdominal pain and diarrhea.  Genitourinary:  Negative for frequency and hematuria.  Musculoskeletal:  Negative for back pain.  Skin:  Negative for rash.  Neurological:  Negative for seizures and headaches.  Psychiatric/Behavioral:  Negative for hallucinations.     Physical Exam Updated Vital Signs BP (!) 155/91 (BP Location: Right Arm)   Pulse 93   Temp 98.3 F (36.8 C) (Oral)   Resp 18   SpO2 96%  Physical Exam Vitals and nursing note reviewed.  Constitutional:      Appearance: He is well-developed.  HENT:     Head: Normocephalic.     Comments: Both external canals inflamed    Nose: Nose normal.  Eyes:     General: No scleral icterus.    Conjunctiva/sclera: Conjunctivae normal.  Neck:     Thyroid: No thyromegaly.  Cardiovascular:     Rate and Rhythm: Normal rate and regular rhythm.     Heart sounds: No murmur heard.    No friction rub. No gallop.  Pulmonary:     Breath sounds: No stridor. No wheezing or rales.  Chest:     Chest wall: No tenderness.  Abdominal:     General: There is no distension.     Tenderness: There is no abdominal tenderness. There is no rebound.  Musculoskeletal:        General: Normal range of motion.     Cervical back: Neck supple.  Lymphadenopathy:     Cervical: No cervical adenopathy.  Skin:    Findings: No erythema or rash.  Neurological:     Mental Status: He is alert and oriented to person, place, and time.     Motor: No abnormal muscle tone.     Coordination: Coordination normal.  Psychiatric:        Behavior: Behavior normal.     ED Results / Procedures / Treatments   Labs (all labs ordered are listed, but only  abnormal results are displayed) Labs Reviewed - No data to display  EKG None  Radiology No results found.  Procedures Procedures    Medications Ordered in ED Medications - No data to display  ED Course/ Medical Decision Making/ A&P                           Medical Decision Making Risk Prescription drug management.  This patient presents to the ED for concern of earache, this involves an extensive number of treatment options, and is a complaint that carries with it a high risk of complications and morbidity.  The differential diagnosis includes continued otitis  externa   Co morbidities that complicate the patient evaluation  Diabetes   Additional history obtained:  Additional history obtained from patient External records from outside source obtained and reviewed including hospital records   Lab Tests:  No labs Imaging Studies ordered:  No imaging Cardiac Monitoring: / EKG:  The patient was maintained on a cardiac monitor.  I personally viewed and interpreted the cardiac monitored which showed an underlying rhythm of: Normal sinus rhythm   Consultations Obtained:  No consultant  Problem List / ED Course / Critical interventions / Medication management  Ear pain and diabetes I ordered medication including f Ciprodex Reevaluation of the patient after these medicines showed that the patient stayed the same I have reviewed the patients home medicines and have made adjustments as needed   Social Determinants of Health:  No medical insurance according to patient   Test / Admission - Considered:  None  Patient with continued otitis externa.  He will be given the Ciprodex drops again and will finish up the Augmentin and take Vicodin for pain and is told he has to follow-up with ENT        Final Clinical Impression(s) / ED Diagnoses Final diagnoses:  Otitis externa of left ear, unspecified chronicity, unspecified type    Rx / DC Orders ED  Discharge Orders          Ordered    ciprofloxacin-dexamethasone (CIPRODEX) OTIC suspension  2 times daily        01/11/22 0955    HYDROcodone-acetaminophen (NORCO/VICODIN) 5-325 MG tablet  Every 6 hours PRN        01/11/22 0956              Bethann Berkshire, MD 01/13/22 1008

## 2022-01-11 NOTE — Discharge Instructions (Addendum)
Follow up with the ent doctor as instructed before

## 2022-03-30 ENCOUNTER — Encounter (HOSPITAL_COMMUNITY): Payer: Self-pay | Admitting: *Deleted

## 2022-03-30 ENCOUNTER — Other Ambulatory Visit: Payer: Self-pay

## 2022-03-30 ENCOUNTER — Emergency Department (HOSPITAL_COMMUNITY)
Admission: EM | Admit: 2022-03-30 | Discharge: 2022-03-30 | Disposition: A | Discharge status: Home / Self Care | Payer: Medicaid Other | Attending: Student | Admitting: Student

## 2022-03-30 DIAGNOSIS — E1065 Type 1 diabetes mellitus with hyperglycemia: Secondary | ICD-10-CM | POA: Diagnosis not present

## 2022-03-30 DIAGNOSIS — R739 Hyperglycemia, unspecified: Secondary | ICD-10-CM

## 2022-03-30 DIAGNOSIS — Z79899 Other long term (current) drug therapy: Secondary | ICD-10-CM | POA: Diagnosis not present

## 2022-03-30 DIAGNOSIS — Z1152 Encounter for screening for COVID-19: Secondary | ICD-10-CM | POA: Diagnosis not present

## 2022-03-30 DIAGNOSIS — Z794 Long term (current) use of insulin: Secondary | ICD-10-CM | POA: Diagnosis not present

## 2022-03-30 DIAGNOSIS — I10 Essential (primary) hypertension: Secondary | ICD-10-CM | POA: Diagnosis not present

## 2022-03-30 LAB — CBC WITH DIFFERENTIAL/PLATELET
Abs Immature Granulocytes: 0.01 10*3/uL (ref 0.00–0.07)
Basophils Absolute: 0.1 10*3/uL (ref 0.0–0.1)
Basophils Relative: 1 %
Eosinophils Absolute: 0.2 10*3/uL (ref 0.0–0.5)
Eosinophils Relative: 2 %
HCT: 45 % (ref 39.0–52.0)
Hemoglobin: 16.2 g/dL (ref 13.0–17.0)
Immature Granulocytes: 0 %
Lymphocytes Relative: 15 %
Lymphs Abs: 1.4 10*3/uL (ref 0.7–4.0)
MCH: 28.9 pg (ref 26.0–34.0)
MCHC: 36 g/dL (ref 30.0–36.0)
MCV: 80.4 fL (ref 80.0–100.0)
Monocytes Absolute: 0.5 10*3/uL (ref 0.1–1.0)
Monocytes Relative: 6 %
Neutro Abs: 6.8 10*3/uL (ref 1.7–7.7)
Neutrophils Relative %: 76 %
Platelets: 226 10*3/uL (ref 150–400)
RBC: 5.6 MIL/uL (ref 4.22–5.81)
RDW: 11.7 % (ref 11.5–15.5)
WBC: 8.8 10*3/uL (ref 4.0–10.5)
nRBC: 0 % (ref 0.0–0.2)

## 2022-03-30 LAB — BLOOD GAS, VENOUS
Acid-Base Excess: 7.8 mmol/L — ABNORMAL HIGH (ref 0.0–2.0)
Bicarbonate: 34.2 mmol/L — ABNORMAL HIGH (ref 20.0–28.0)
Drawn by: 2160
O2 Saturation: 69 %
Patient temperature: 37.1
pCO2, Ven: 54 mmHg (ref 44–60)
pH, Ven: 7.41 (ref 7.25–7.43)
pO2, Ven: 39 mmHg (ref 32–45)

## 2022-03-30 LAB — CBG MONITORING, ED
Glucose-Capillary: 277 mg/dL — ABNORMAL HIGH (ref 70–99)
Glucose-Capillary: 159 mg/dL — ABNORMAL HIGH (ref 70–99)
Glucose-Capillary: 106 mg/dL — ABNORMAL HIGH (ref 70–99)
Glucose-Capillary: 147 mg/dL — ABNORMAL HIGH (ref 70–99)

## 2022-03-30 LAB — COMPREHENSIVE METABOLIC PANEL
ALT: 57 U/L — ABNORMAL HIGH (ref 0–44)
AST: 36 U/L (ref 15–41)
Albumin: 4.3 g/dL (ref 3.5–5.0)
Alkaline Phosphatase: 111 U/L (ref 38–126)
Anion gap: 12 (ref 5–15)
BUN: 31 mg/dL — ABNORMAL HIGH (ref 6–20)
CO2: 27 mmol/L (ref 22–32)
Calcium: 9.4 mg/dL (ref 8.9–10.3)
Chloride: 92 mmol/L — ABNORMAL LOW (ref 98–111)
Creatinine, Ser: 1.3 mg/dL — ABNORMAL HIGH (ref 0.61–1.24)
GFR, Estimated: >60 mL/min (ref >60)
Glucose, Bld: 347 mg/dL — ABNORMAL HIGH (ref 70–99)
Potassium: 4 mmol/L (ref 3.5–5.1)
Sodium: 131 mmol/L — ABNORMAL LOW (ref 135–145)
Total Bilirubin: 1.1 mg/dL (ref 0.3–1.2)
Total Protein: 7.2 g/dL (ref 6.5–8.1)

## 2022-03-30 LAB — URINALYSIS, ROUTINE W REFLEX MICROSCOPIC
Bilirubin Urine: NEGATIVE
Glucose, UA: >=500 mg/dL — AB
Hgb urine dipstick: NEGATIVE
Ketones, ur: NEGATIVE mg/dL
Leukocytes,Ua: NEGATIVE
Nitrite: NEGATIVE
Protein, ur: NEGATIVE mg/dL
Specific Gravity, Urine: <1.005 — ABNORMAL LOW (ref 1.005–1.030)
pH: 6 (ref 5.0–8.0)

## 2022-03-30 LAB — URINALYSIS, MICROSCOPIC (REFLEX)

## 2022-03-30 LAB — RESP PANEL BY RT-PCR (RSV, FLU A&B, COVID)  RVPGX2
Influenza A by PCR: NEGATIVE
Influenza B by PCR: NEGATIVE
Resp Syncytial Virus by PCR: NEGATIVE
SARS Coronavirus 2 by RT PCR: NEGATIVE

## 2022-03-30 LAB — BETA-HYDROXYBUTYRIC ACID: Beta-Hydroxybutyric Acid: 0.09 mmol/L (ref 0.05–0.27)

## 2022-03-30 MED ORDER — LACTATED RINGERS IV BOLUS
1000.0000 mL | Freq: Once | INTRAVENOUS | Status: AC
  Administered 2022-03-30: 1000 mL via INTRAVENOUS

## 2022-03-30 MED ORDER — PROCHLORPERAZINE EDISYLATE 10 MG/2ML IJ SOLN
10.0000 mg | Freq: Once | INTRAMUSCULAR | Status: AC
  Administered 2022-03-30: 10 mg via INTRAVENOUS
  Filled 2022-03-30: qty 2

## 2022-03-30 MED ORDER — DIPHENHYDRAMINE HCL 50 MG/ML IJ SOLN
25.0000 mg | Freq: Once | INTRAMUSCULAR | Status: AC
  Administered 2022-03-30: 25 mg via INTRAVENOUS
  Filled 2022-03-30: qty 1

## 2022-03-30 NOTE — Discharge Instructions (Signed)
Follow up with your md for recheck next week

## 2022-03-30 NOTE — Inpatient Diabetes Management (Signed)
Inpatient Diabetes Program Recommendations  AACE/ADA: New Consensus Statement on Inpatient Glycemic Control   Target Ranges:  Prepandial:   less than 140 mg/dL      Peak postprandial:   less than 180 mg/dL (1-2 hours)      Critically ill patients:  140 - 180 mg/dL    Latest Reference Range & Units 03/30/22 11:27 03/30/22 12:45  Glucose-Capillary 70 - 99 mg/dL 277 (H) 159 (H)    Latest Reference Range & Units 03/30/22 11:07  CO2 22 - 32 mmol/L 27  Glucose 70 - 99 mg/dL 347 (H)  BUN 6 - 20 mg/dL 31 (H)  Creatinine 0.61 - 1.24 mg/dL 1.30 (H)  Calcium 8.9 - 10.3 mg/dL 9.4  Anion gap 5 - 15  12    Latest Reference Range & Units 03/30/22 11:07  Beta-Hydroxybutyric Acid 0.05 - 0.27 mmol/L 0.09   Review of Glycemic Control  Diabetes history: DM1 Outpatient Diabetes medications: OmniPod pump with Humalog insulin, FreeStyle Libre3 CGM Current orders for Inpatient glycemic control: None   NOTE: Patient is currently in ED with N/V, hyperglycemia. Per Dr. Mikle Bosworth note today, "Patient states that starting since last night he has had vomiting and sugars have been greater than 600. This morning when he awoke, he found his sugar to be greater than 600 and administered 15 units of insulin via pen. On recheck, his glucose remained over 600 so we gave an additional 18 units. He then rechecked an hour later and sugar still had not improved so we gave an additional 15 units for a total of 48 units over 3 hours."  Spoke with patient over the phone. Patient confirms that he uses an OmniPod insulin pump with Humalog and he uses FreeStyle Libre3 CGM for glucose monitoring. Patient confirms that his glucose has been elevated since last night and that when he awoke this morning his glucose was over 600 mg/dl so he took Humalog from insulin pen to get his glucose down. Patient reports that he took the last dose of Humalog around 7am today at home. Patient states his current glucose is 121 mg/dl on his CGM. Patient  reports that his Libre3 CGM does not communicate with his insulin pump. Patient still has the OmniPod insulin pump on and he feel like it is leaking as his wife could smell insulin. Patient reports that his current pod has been on for about 3 days. Asked patient how much insulin is showing in his pump and he reported that it was out of insulin when he just looked. Patient does not have any pump supplies with him at the hospital. Patient reports that his basal rate is 2.4 units/hr, I:CR 1:11 grams, I:SF 1:31 mg/dl.  Patient reports that if he gets admitted he could get family to bring additional pump supplies to him at the hospital. Explained that if he is discharged, he needs to get a new OmniPod insulin pump pod on as soon as he gets home. Patient reports that he sees Endocrinologist with [facility] and he has an appointment coming up next week on Thursday. Discussed auto mode function with OmniPod with Dexcom CGM and encouraged patient to talk to Endocrinologist about the Dexcom at next visit (patient reported that with his prior insurance Dexcom was to expensive which is why he was not currently using it).  Patient reports that his last A1C was in 9% range (improved from 12%). Informed patient I would communicate with attending provider and RN about his pump being out of insulin to  see if there was a plan for discharge or admission. Patient states he feels better but still nauseous and last vomited around 6am today. Sent chat message to Dr. Matilde Sprang and Caryl Pina, RN to let them know that patient reports that his insulin pump is out of insulin. Dr. Matilde Sprang notes plan for patient to be discharged home today.   Thanks, Barnie Alderman, RN, MSN, [place] [facility] Coordinator Inpatient Diabetes Program [phone] (Team Pager from 8am to [place])

## 2022-03-30 NOTE — ED Notes (Signed)
MD informed of patients CBG. Patient given orange juice per MD request. Nurse informed

## 2022-03-30 NOTE — ED Triage Notes (Signed)
Pt in c/o BS >600 onset last night, DM I, with insulin pump in place, current BS per pump at 400, pt reports n/v, x2 emesis events in the last 24 hrs, denies diarrhea, A&O x4

## 2022-03-30 NOTE — ED Provider Notes (Signed)
[facility] Provider Note  CSN: [id number] Arrival date & time: 03/30/22 1033  Chief Complaint(s) Hyperglycemia  HPI Eric [name] is a 37 y.o. 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  Patient states that starting since last night he has had vomiting and sugars have been greater than 600.  This morning when he awoke, he found his sugar to be greater than 600 and administered 15 units of insulin via pen.  On recheck, his glucose remained over 600 so we gave an additional 18 units.  He then rechecked an hour later and sugar still had not improved so we gave an additional 15 units for a total of 48 units over 3 hours.  He states that his children are at home with a stomach virus causing vomiting.  He states that his last glucose was around 400 and patient arrives with a glucose of 272 here in the emergency department today.  Denies chest pain, shortness of breath, fever or other systemic symptoms.   Past Medical History Past Medical History:  Diagnosis Date   Anxiety    Bipolar 2 disorder, major depressive episode ([place])    Depression    Diabetes mellitus    Hypertension    Patient Active Problem List   Diagnosis Date Noted   AKI (acute kidney injury) ([place]) 08/26/2021   DKA, type 1 ([place]) 08/26/2021   DKA (diabetic ketoacidosis) ([place]) 08/25/2021   Cellulitis of finger of left hand 08/12/2021   Tobacco use 08/12/2021   Other synovitis and tenosynovitis, left hand 08/12/2021   Uncontrolled type 1 diabetes mellitus with hyperglycemia, with long-term current use of insulin ([place]) 08/12/2021   Hyperosmolar non-ketotic state due to type 2 diabetes mellitus ([place]) 08/12/2021   Pseudohyponatremia 08/12/2021   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 Authorizing Provider  acetaminophen (TYLENOL) 500 MG tablet Take 1,000 mg by mouth every 6 (six) hours as needed for moderate pain.    [provider]  ARIPiprazole (ABILIFY) 5 MG tablet Take 5 mg by mouth daily.    [provider]  clobetasol ointment (TEMOVATE) [id number] % Apply 1 Application topically 2 (two) times daily. 08/21/21   [provider]  cyclobenzaprine (FLEXERIL) 5 MG tablet Take 1 tablet by mouth 3 (three) times daily.    [provider]  diazepam (VALIUM) 10 MG tablet Take 1 tablet (10 mg total) by mouth every 6 (six) hours as needed. Patient taking differently: Take 10 mg by mouth every 6 (six) hours as needed for anxiety. [id number]   Delora Fuel, MD  escitalopram (LEXAPRO) 20 MG tablet Take 20 mg by mouth daily. 04/05/20   [provider]  furosemide (LASIX) 20 MG tablet Take 1 tablet (20 mg total) by mouth daily. 12/06/21   Ripley Fraise, MD  gabapentin (NEURONTIN) 600 MG tablet Take 600  mg by mouth 3 (three) times daily. 08/21/21   [provider]  HYDROcodone-acetaminophen (NORCO/VICODIN) 5-325 MG tablet Take 1 tablet by mouth every 6 (six) hours as needed. 01/11/22   Milton Ferguson, MD  hydrOXYzine (VISTARIL) 25 MG capsule Take 25 mg by mouth 3 (three) times daily. 07/05/21   [provider]  insulin lispro (HUMALOG) 100 UNIT/ML injection Inject 1-10 Units into the skin 3 (three) times daily before meals. Patient taking differently: 1-10  Units 3 (three) times daily with meals. Based on sliding scale/carb intake-via pump 02/03/11   Elsaid, Hind I, MD  meloxicam (MOBIC) 7.5 MG tablet Take 1 tablet (7.5 mg total) by mouth daily. 11/13/21   Carole Civil, MD  methocarbamol (ROBAXIN) 750 MG tablet Take 750 mg by mouth 3 (three) times daily.    [provider]  Multiple Vitamin (MULTIVITAMIN WITH MINERALS) TABS tablet Take 1 tablet by mouth daily.    [provider]  ondansetron (ZOFRAN-ODT) 8 MG disintegrating tablet Take 8 mg by mouth every 8 (eight) hours as needed for nausea or vomiting.    [provider]  oxymetazoline (AFRIN) 0.05 % nasal spray Place 1 spray into both nostrils 2 (two) times daily.    [provider]  Propylene Glycol (SYSTANE COMPLETE OP) Apply 1 drop to eye daily as needed (dry eye).    [provider]  silver sulfADIAZINE (SILVADENE) 1 % cream Apply 1 Application topically 2 (two) times daily. 07/02/20   [provider]  traZODone (DESYREL) 100 MG tablet Take 100 mg by mouth at bedtime. 07/18/21   [provider]  VITAMIN A PO Take 1 capsule by mouth daily.    [provider]                                                                                                                                    Past Surgical History Past Surgical History:  Procedure Laterality Date   AMPUTATION Right    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   FRACTURE SURGERY     reconstructive left knee surgery   KNEE SURGERY     Family History Family History  Problem Relation Age of Onset   Hypertension Mother    Hypertension Father     Social History Social History   Tobacco Use   Smoking status: Never   Smokeless tobacco: Current    Types: Snuff  Vaping Use   Vaping Use: Never used  Substance Use Topics   Alcohol use: Not Currently   Drug use: Not Currently   Allergies Patient has no known allergies.  Review of Systems Review of Systems   Gastrointestinal:  Positive for nausea and vomiting.    Physical Exam Vital Signs  I have reviewed the triage vital signs BP (!) 132/97 (BP Location: Left Arm)   Pulse 99   Temp 97.8 F (36.6 C) (Oral)   Resp 19  SpO2 97%   Physical Exam Vitals and nursing note reviewed.  Constitutional:      General: He is not in acute distress.    Appearance: He is well-developed.  HENT:     Head: Normocephalic and atraumatic.  Eyes:     Conjunctiva/sclera: Conjunctivae normal.  Cardiovascular:     Rate and Rhythm: Normal rate and regular rhythm.     Heart sounds: No murmur heard. Pulmonary:     Effort: Pulmonary effort is normal. No respiratory distress.     Breath sounds: Normal breath sounds.  Abdominal:     Palpations: Abdomen is soft.     Tenderness: There is no abdominal tenderness.  Musculoskeletal:        General: No swelling.     Cervical back: Neck supple.  Skin:    General: Skin is warm and dry.     Capillary Refill: Capillary refill takes less than 2 seconds.  Neurological:     Mental Status: He is alert.  Psychiatric:        Mood and Affect: Mood normal.     ED Results and Treatments Labs (all labs ordered are listed, but only abnormal results are displayed) Labs Reviewed  COMPREHENSIVE METABOLIC PANEL - Abnormal; Notable for the following components:      Result Value   Sodium 131 (*)    Chloride 92 (*)    Glucose, Bld 347 (*)    BUN 31 (*)    Creatinine, Ser 1.30 (*)    ALT 57 (*)    All other components within normal limits  BLOOD GAS, VENOUS - Abnormal; Notable for the following components:   Bicarbonate 34.2 (*)    Acid-Base Excess 7.8 (*)    All other components within normal limits  CBG MONITORING, ED - Abnormal; Notable for the following components:   Glucose-Capillary 277 (*)    All other components within normal limits  CBC WITH DIFFERENTIAL/PLATELET  BETA-HYDROXYBUTYRIC ACID  URINALYSIS, ROUTINE W REFLEX MICROSCOPIC                                                                                                                           Radiology No results found.  Pertinent labs & imaging results that were available during my care of the patient were reviewed by me and considered in my medical decision making (see MDM for details).  Medications Ordered in ED Medications  lactated ringers bolus 1,000 mL (has no administration in time range)  Procedures Procedures  (including critical care time)  Medical Decision Making / ED Course   This patient presents to the ED for concern of hyperglycemia, this involves an extensive number of treatment options, and is a complaint that carries with it a high risk of complications and morbidity.  The differential diagnosis includes DKA, HHS, stress hyperglycemia, overcorrection with home insulin  MDM: Patient seen emergency room for evaluation of hyperglycemia.  Physical exam largely unremarkable.  Laboratory evaluation with initial glucose of 347, BUN 31, creatinine 1.3, pH normal with no anion gap.  No ketones in the urine and beta hydroxybutyrate is negative.  COVID, flu, RSV obtained in the setting of patient's nausea and vomiting given that patient has children at home who are sick.  Patient had a headache, reevaluation and received a headache cocktail with improvement of symptoms.  During his ER stay, his sugars continue to drop and got as low as 100.  It appears the patient probably overcorrected with his insulin at home and patient drank some apple juice leading to improvement of his blood sugar.  He has a Dexcom monitoring system at home and will increase sugary beverage content if his sugar drops.  At time of signout, patient pending 1 additional blood sugar check.  Anticipate discharge home.   Additional history obtained: -Additional history obtained  from partner -External records from outside source obtained and reviewed including: Chart review including previous notes, labs, imaging, consultation notes   Lab Tests: -I ordered, reviewed, and interpreted labs.   The pertinent results include:   Labs Reviewed  COMPREHENSIVE METABOLIC PANEL - Abnormal; Notable for the following components:      Result Value   Sodium 131 (*)    Chloride 92 (*)    Glucose, Bld 347 (*)    BUN 31 (*)    Creatinine, Ser 1.30 (*)    ALT 57 (*)    All other components within normal limits  BLOOD GAS, VENOUS - Abnormal; Notable for the following components:   Bicarbonate 34.2 (*)    Acid-Base Excess 7.8 (*)    All other components within normal limits  CBG MONITORING, ED - Abnormal; Notable for the following components:   Glucose-Capillary 277 (*)    All other components within normal limits  CBC WITH DIFFERENTIAL/PLATELET  BETA-HYDROXYBUTYRIC ACID  URINALYSIS, ROUTINE W REFLEX MICROSCOPIC         Medicines ordered and prescription drug management: Meds ordered this encounter  Medications   lactated ringers bolus 1,000 mL    -I have reviewed the patients home medicines and have made adjustments as needed  Critical interventions none     Cardiac Monitoring: The patient was maintained on a cardiac monitor.  I personally viewed and interpreted the cardiac monitored which showed an underlying rhythm of: NSR  Social Determinants of Health:  Factors impacting patients care include: none   Reevaluation: After the interventions noted above, I reevaluated the patient and found that they have :improved  Co morbidities that complicate the patient evaluation  Past Medical History:  Diagnosis Date   Anxiety    Bipolar 2 disorder, major depressive episode ([place])    Depression    Diabetes mellitus    Hypertension       Dispostion: I considered admission for this patient, but at this time he does not meet inpatient criteria for admission  and if glucose is improving on recheck he will be safe for discharge with outpatient follow-up     Final Clinical Impression(s) /  ED Diagnoses Final diagnoses:  None     @PCDICTATION$ @    Teressa Lower, MD 03/30/22 1735

## 2022-04-12 ENCOUNTER — Encounter: Payer: Self-pay | Admitting: Radiology

## 2023-01-08 ENCOUNTER — Emergency Department (HOSPITAL_COMMUNITY)
Admission: EM | Admit: 2023-01-08 | Discharge: 2023-01-12 | Disposition: A | Discharge status: Other Acute Inpatient Hospital | Payer: Medicaid Other | Source: Home / Self Care | Attending: Emergency Medicine | Admitting: Emergency Medicine

## 2023-01-08 ENCOUNTER — Other Ambulatory Visit: Payer: Self-pay

## 2023-01-08 DIAGNOSIS — F3130 Bipolar disorder, current episode depressed, mild or moderate severity, unspecified: Secondary | ICD-10-CM | POA: Insufficient documentation

## 2023-01-08 DIAGNOSIS — F29 Unspecified psychosis not due to a substance or known physiological condition: Secondary | ICD-10-CM | POA: Insufficient documentation

## 2023-01-08 DIAGNOSIS — F1010 Alcohol abuse, uncomplicated: Secondary | ICD-10-CM | POA: Insufficient documentation

## 2023-01-08 DIAGNOSIS — Z79899 Other long term (current) drug therapy: Secondary | ICD-10-CM | POA: Insufficient documentation

## 2023-01-08 DIAGNOSIS — Z630 Problems in relationship with spouse or partner: Secondary | ICD-10-CM

## 2023-01-08 DIAGNOSIS — Y903 Blood alcohol level of 60-79 mg/100 ml: Secondary | ICD-10-CM | POA: Insufficient documentation

## 2023-01-08 DIAGNOSIS — F319 Bipolar disorder, unspecified: Secondary | ICD-10-CM

## 2023-01-08 LAB — COMPREHENSIVE METABOLIC PANEL
ALT: 23 U/L (ref 0–44)
AST: 15 U/L (ref 15–41)
Albumin: 4.5 g/dL (ref 3.5–5.0)
Alkaline Phosphatase: 84 U/L (ref 38–126)
Anion gap: 8 (ref 5–15)
BUN: 10 mg/dL (ref 6–20)
CO2: 25 mmol/L (ref 22–32)
Calcium: 9.1 mg/dL (ref 8.9–10.3)
Chloride: 105 mmol/L (ref 98–111)
Creatinine, Ser: 0.96 mg/dL (ref 0.61–1.24)
GFR, Estimated: >60 mL/min (ref >60)
Glucose, Bld: 100 mg/dL — ABNORMAL HIGH (ref 70–99)
Potassium: 3.8 mmol/L (ref 3.5–5.1)
Sodium: 138 mmol/L (ref 135–145)
Total Bilirubin: 0.8 mg/dL (ref <1.2)
Total Protein: 7.8 g/dL (ref 6.5–8.1)

## 2023-01-08 LAB — CBC
HCT: 47 % (ref 39.0–52.0)
Hemoglobin: 16.4 g/dL (ref 13.0–17.0)
MCH: 29 pg (ref 26.0–34.0)
MCHC: 34.9 g/dL (ref 30.0–36.0)
MCV: 83 fL (ref 80.0–100.0)
Platelets: 248 10*3/uL (ref 150–400)
RBC: 5.66 MIL/uL (ref 4.22–5.81)
RDW: 12.4 % (ref 11.5–15.5)
WBC: 7.6 10*3/uL (ref 4.0–10.5)
nRBC: 0 % (ref 0.0–0.2)

## 2023-01-08 LAB — ACETAMINOPHEN LEVEL: Acetaminophen (Tylenol), Serum: <10 ug/mL — ABNORMAL LOW (ref 10–30)

## 2023-01-08 LAB — ETHANOL: Alcohol, Ethyl (B): 72 mg/dL — ABNORMAL HIGH (ref <10)

## 2023-01-08 LAB — SALICYLATE LEVEL: Salicylate Lvl: <7 mg/dL — ABNORMAL LOW (ref 7.0–30.0)

## 2023-01-08 NOTE — ED Notes (Addendum)
Pt belongings:  Black jacket Black long sleeve shirt Grey/black hat Blue jeans [employer] Black watch Black ring 2 ear gauges Grey necklace 2 cell phones Wallet Keys 2 pill bottles Brown belt Green underwear White socks

## 2023-01-08 NOTE — ED Provider Notes (Signed)
[facility] Provider Note    Event Date/Time   First MD Initiated Contact with Patient 01/08/23 2218     (approximate)   History   IVC   HPI Eric [name] is a 37 y.o. male with history of bipolar, depression presenting today under IVC for combative and threatening behavior with SI.  Patient himself states that he got an argument with his ex-wife today.  He says that at no point was he depressed, or had suicidal ideation or homicidal ideation.  Denies any symptoms like that recently.  Denies any hallucinations.  Taking his medication as prescribed.  Reviewed outpatient psychiatry notes for history of severe depression.  No recent ED evaluations for this.     Physical Exam   Triage Vital Signs: ED Triage Vitals  Encounter Vitals Group     BP 01/08/23 2204 (!) 160/105     Systolic BP Percentile --      Diastolic BP Percentile --      Pulse Rate 01/08/23 2204 (!) 114     Resp 01/08/23 2204 18     Temp 01/08/23 2204 98 F (36.7 C)     Temp src --      SpO2 01/08/23 2204 100 %     Weight 01/08/23 2203 191 lb (86.6 kg)     Height 01/08/23 2203 5\' 9"  (1.753 m)     Head Circumference --      Peak Flow --      Pain Score --      Pain Loc --      Pain Education --      Exclude from Growth Chart --     Most recent vital signs: Vitals:   01/08/23 2204  BP: (!) 160/105  Pulse: (!) 114  Resp: 18  Temp: 98 F (36.7 C)  SpO2: 100%   I have reviewed the vital signs. General:  Awake, alert, no acute distress. Head:  Normocephalic, Atraumatic. EENT:  PERRL, EOMI, Oral mucosa pink and moist, Neck is supple. Cardiovascular: Regular rate, 2+ distal pulses. Respiratory:  Normal respiratory effort, symmetrical expansion, no distress.   Extremities:  Moving all four extremities through full ROM without pain.   Neuro:  Alert and oriented.  Interacting appropriately.   Skin:  Warm, dry, no rash.   Psych: Appropriate affect.    ED Results /  Procedures / Treatments   Labs (all labs ordered are listed, but only abnormal results are displayed) Labs Reviewed  CBC  COMPREHENSIVE METABOLIC PANEL  ETHANOL  SALICYLATE LEVEL  ACETAMINOPHEN LEVEL  URINE DRUG SCREEN, QUALITATIVE ([facility] ONLY)     EKG    RADIOLOGY    PROCEDURES:  Critical Care performed: No  Procedures   MEDICATIONS ORDERED IN ED: Medications - No data to display   IMPRESSION / MDM / ASSESSMENT AND PLAN / ED COURSE  I reviewed the triage vital signs and the nursing notes.                              Differential diagnosis includes, but is not limited to, depression, worsening bipolar, SI  Patient's presentation is most consistent with acute complicated illness / injury requiring diagnostic workup.  Patient is a 37 year old male presenting as an IVC over concerns of possible SI.  Patient himself at this time states that he has not had any symptoms like this and currently denies all SI, HI, depression, hallucinations.  He  is medically cleared.  Given IVC, will have psychiatry evaluate patient.  Signed out pending psychiatric evaluation.  The patient has been placed in psychiatric observation due to the need to provide a safe environment for the patient while obtaining psychiatric consultation and evaluation, as well as ongoing medical and medication management to treat the patient's condition.  The patient has been placed under full IVC at this time.     FINAL CLINICAL IMPRESSION(S) / ED DIAGNOSES   Final diagnoses:  Bipolar 1 disorder, depressed (H[facility])     Rx / DC Orders   ED Discharge Orders     None        Note:  This document was prepared using Dragon voice recognition software and may include unintentional dictation errors.   Janith Lima, MD 01/08/23 2238
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
## 2023-01-09 DIAGNOSIS — F319 Bipolar disorder, unspecified: Secondary | ICD-10-CM | POA: Diagnosis not present

## 2023-01-09 DIAGNOSIS — Z630 Problems in relationship with spouse or partner: Secondary | ICD-10-CM | POA: Diagnosis not present

## 2023-01-09 LAB — URINE DRUG SCREEN, QUALITATIVE (ARMC ONLY)
Amphetamines, Ur Screen: NOT DETECTED
Barbiturates, Ur Screen: NOT DETECTED
Benzodiazepine, Ur Scrn: POSITIVE — AB
Cannabinoid 50 Ng, Ur ~~LOC~~: NOT DETECTED
Cocaine Metabolite,Ur ~~LOC~~: NOT DETECTED
MDMA (Ecstasy)Ur Screen: NOT DETECTED
Methadone Scn, Ur: NOT DETECTED
Opiate, Ur Screen: POSITIVE — AB
Phencyclidine (PCP) Ur S: NOT DETECTED
Tricyclic, Ur Screen: NOT DETECTED

## 2023-01-09 LAB — CBG MONITORING, ED
Glucose-Capillary: 411 mg/dL — ABNORMAL HIGH (ref 70–99)
Glucose-Capillary: 202 mg/dL — ABNORMAL HIGH (ref 70–99)
Glucose-Capillary: 240 mg/dL — ABNORMAL HIGH (ref 70–99)
Glucose-Capillary: 374 mg/dL — ABNORMAL HIGH (ref 70–99)

## 2023-01-09 MED ORDER — DIAZEPAM 5 MG PO TABS
10.0000 mg | ORAL_TABLET | Freq: Two times a day (BID) | ORAL | Status: DC | PRN
  Administered 2023-01-09 – 2023-01-11 (×2): 10 mg via ORAL
  Filled 2023-01-09 (×2): qty 2

## 2023-01-09 MED ORDER — INSULIN GLARGINE-YFGN 100 UNIT/ML ~~LOC~~ SOLN
35.0000 [IU] | Freq: Every day | SUBCUTANEOUS | Status: DC
  Administered 2023-01-09 – 2023-01-12 (×4): 35 [IU] via SUBCUTANEOUS
  Filled 2023-01-09 (×4): qty 0.35

## 2023-01-09 MED ORDER — DIAZEPAM 5 MG PO TABS: 10.0000 mg | ORAL_TABLET | Freq: Four times a day (QID) | ORAL | Status: DC | PRN

## 2023-01-09 MED ORDER — HYDROXYZINE HCL 25 MG PO TABS
25.0000 mg | ORAL_TABLET | Freq: Three times a day (TID) | ORAL | Status: DC
  Administered 2023-01-09 – 2023-01-12 (×9): 25 mg via ORAL
  Filled 2023-01-09 (×9): qty 1

## 2023-01-09 MED ORDER — HYDROXYZINE PAMOATE 25 MG PO CAPS
25.0000 mg | ORAL_CAPSULE | Freq: Three times a day (TID) | ORAL | Status: DC
  Filled 2023-01-09: qty 1

## 2023-01-09 MED ORDER — ESCITALOPRAM OXALATE 10 MG PO TABS
10.0000 mg | ORAL_TABLET | Freq: Every day | ORAL | Status: DC
  Administered 2023-01-09 – 2023-01-12 (×4): 10 mg via ORAL
  Filled 2023-01-09 (×4): qty 1

## 2023-01-09 MED ORDER — INSULIN ASPART 100 UNIT/ML IJ SOLN
0.0000 [IU] | Freq: Three times a day (TID) | INTRAMUSCULAR | Status: DC
  Administered 2023-01-09 (×2): 5 [IU] via SUBCUTANEOUS
  Administered 2023-01-09: 15 [IU] via SUBCUTANEOUS
  Administered 2023-01-10: 5 [IU] via SUBCUTANEOUS
  Administered 2023-01-10 – 2023-01-11 (×2): 15 [IU] via SUBCUTANEOUS
  Administered 2023-01-11: 8 [IU] via SUBCUTANEOUS
  Administered 2023-01-12: 2 [IU] via SUBCUTANEOUS
  Administered 2023-01-12: 3 [IU] via SUBCUTANEOUS
  Filled 2023-01-09 (×9): qty 1

## 2023-01-09 MED ORDER — ARIPIPRAZOLE 5 MG PO TABS
5.0000 mg | ORAL_TABLET | Freq: Every day | ORAL | Status: DC
  Administered 2023-01-09 – 2023-01-12 (×4): 5 mg via ORAL
  Filled 2023-01-09 (×4): qty 1

## 2023-01-09 MED ORDER — HYDROXYZINE PAMOATE 25 MG PO CAPS: 25.0000 mg | ORAL_CAPSULE | Freq: Three times a day (TID) | ORAL | Status: DC

## 2023-01-09 NOTE — ED Notes (Signed)
Dexcom reading 272 at this time

## 2023-01-09 NOTE — ED Notes (Signed)
Dexcom reader alerting that sensor needs to be switched- per patient, patient did not bring extra sensors to the hospital with him. Diabetes RN notified by this RN- informed this RN CBG checks ACHS would be okay while patient is in the hospital.

## 2023-01-09 NOTE — ED Notes (Signed)
Dexcom reading 98
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
## 2023-01-09 NOTE — ED Notes (Signed)
Pt received snack but refused a drink

## 2023-01-09 NOTE — ED Notes (Signed)
IVC  CONSULT  DONE  PENDING  PLACEMENT 

## 2023-01-09 NOTE — BH Assessment (Signed)
Comprehensive Clinical Assessment (CCA) Note  01/09/2023 Eric [name] [id number]  Chief Complaint: Patient is a 37 year old male presenting to [facility] ED under IVC. Per triage note Pt to ED via [facility] Dept, per IVC paperwork pt has hx mental illness, over the past few weeks pt has become increasingly combative towards family, tonight he threatened to shoot himself and drive his car into a tree. Pt currently denies SI/HI. Pt reports having a few beers tonight. Calm and cooperative. During assessment patient appears alert and oriented x4, calm and cooperative. When asked why the patient was presenting to the ED he reports "I have no idea" he is able report that he was drinking "3-4 beers" and reports drinking "every now and then." Patient was not very forthcoming with information but reports "me and my wife got into a argument and I left", he denies any suicidal claims. Patient denies SI/HI/AH/VH.  Collateral information provided by the patient's wife Demarcus Kindig [phone] who reports "this has gone back for years, I have videos and texts, he abuses prescription drugs, tonight we had a disagreement it was through text and when I came home it was pleasant but then he gets pissy and upset because of what I said to him last night, we started arguing, he took one of his insulin pens and started throwing things, he was bout to punch holes in the walls so I had to bear hug him." "He has a alcohol problem he already has 2 DWIs, he abuses his valium, his oxycodone, he told me the week before that he was searching for his gun at his dad's house."   Per Psyc NP Bishop Limbo patient to be reassessed  Chief Complaint  Patient presents with   IVC   Visit Diagnosis: Alcohol abuse    CCA Screening, Triage and Referral (STR)  Patient Reported Information How did you hear about [place]? Legal System  Referral name: No data recorded Referral phone number: No data recorded  Whom do you see for  routine medical problems? No data recorded Practice/Facility Name: No data recorded Practice/Facility Phone Number: No data recorded Name of Contact: No data recorded Contact Number: No data recorded Contact Fax Number: No data recorded Prescriber Name: No data recorded Prescriber Address (if known): No data recorded  What Is the Reason for Your Visit/Call Today? Pt to ED via [facility] Dept, per IVC paperwork pt has hx mental illness, over the past few weeks pt has become increasingly combative towards family, tonight he threatened to shoot himself and drive his car into a tree. Pt currently denies SI/HI. Pt reports having a few beers tonight. Calm and cooperative.  How Long Has This Been Causing You Problems? > than 6 months  What Do You Feel Would Help You the Most Today? No data recorded  Have You Recently Been in Any Inpatient Treatment (Hospital/Detox/Crisis Center/28-Day Program)? No data recorded Name/Location of Program/Hospital:No data recorded How Long Were You There? No data recorded When Were You Discharged? No data recorded  Have You Ever Received Services From [facility] Before? No data recorded Who Do You See at [facility]? No data recorded  Have You Recently Had Any Thoughts About Hurting Yourself? No  Are You Planning to Commit Suicide/Harm Yourself At This time? No   Have you Recently Had Thoughts About Hurting Someone Karolee Ohs? No  Explanation: No data recorded  Have You Used Any Alcohol or Drugs in the Past 24 Hours? No data recorded How  Long Ago Did You Use Drugs or Alcohol? No data recorded What Did You Use and How Much? No data recorded  Do You Currently Have a Therapist/Psychiatrist? No data recorded Name of Therapist/Psychiatrist: No data recorded  Have You Been Recently Discharged From Any Office Practice or Programs? No  Explanation of Discharge From Practice/Program: No data recorded    CCA Screening Triage Referral Assessment Type of  Contact: Face-to-Face  Is this Initial or Reassessment? No data recorded Date Telepsych consult ordered in [facility]:  No data recorded Time Telepsych consult ordered in [facility]:  No data recorded  Patient Reported Information Reviewed? No data recorded Patient Left Without Being Seen? No data recorded Reason for Not Completing Assessment: No data recorded  Collateral Involvement: No data recorded  Does Patient Have a Court Appointed Legal Guardian? No data recorded Name and Contact of Legal Guardian: No data recorded If Minor and Not Living with Parent(s), Who has Custody? No data recorded Is CPS involved or ever been involved? Never  Is APS involved or ever been involved? Never   Patient Determined To Be At Risk for Harm To Self or Others Based on Review of Patient Reported Information or Presenting Complaint? No  Method: No data recorded Availability of Means: No data recorded Intent: No data recorded Notification Required: No data recorded Additional Information for Danger to Others Potential: No data recorded Additional Comments for Danger to Others Potential: No data recorded Are There Guns or Other Weapons in Your Home? No  Types of Guns/Weapons: No data recorded Are These Weapons Safely Secured?                            No data recorded Who Could Verify You Are Able To Have These Secured: No data recorded Do You Have any Outstanding Charges, Pending Court Dates, Parole/Probation? No data recorded Contacted To Inform of Risk of Harm To Self or Others: No data recorded  Location of Assessment: [facility] ED   Does Patient Present under Involuntary Commitment? Yes  IVC Papers Initial File Date: No data recorded  [place] of Residence: [place]   Patient Currently Receiving the Following Services: No data recorded  Determination of Need: Emergent (2 hours)   Options For Referral: No data recorded    CCA Biopsychosocial Intake/Chief Complaint:  No data recorded Current  Symptoms/Problems: No data recorded  Patient Reported Schizophrenia/Schizoaffective Diagnosis in Past: No   Strengths: Patient is able to communicate his needs  Preferences: No data recorded Abilities: No data recorded  Type of Services Patient Feels are Needed: No data recorded  Initial Clinical Notes/Concerns: No data recorded  Mental Health Symptoms Depression:   Irritability; Change in energy/activity   Duration of Depressive symptoms:  Greater than two weeks   Mania:   None   Anxiety:    None   Psychosis:   None   Duration of Psychotic symptoms: No data recorded  Trauma:   None   Obsessions:   None   Compulsions:   None   Inattention:   None   Hyperactivity/Impulsivity:   None   Oppositional/Defiant Behaviors:   None   Emotional Irregularity:   None   Other Mood/Personality Symptoms:  No data recorded   Mental Status Exam Appearance and self-care  Stature:   Average   Weight:   Average weight   Clothing:   Disheveled   Grooming:   Neglected   Cosmetic use:   None   Posture/gait:  Normal   Motor activity:   Not Remarkable   Sensorium  Attention:   Normal   Concentration:   Normal   Orientation:   X5   Recall/memory:   Normal   Affect and Mood  Affect:   Appropriate   Mood:   Other (Comment)   Relating  Eye contact:   Normal   Facial expression:   Responsive   Attitude toward examiner:   Cooperative   Thought and Language  Speech flow:  Clear and Coherent   Thought content:   Appropriate to Mood and Circumstances   Preoccupation:   None   Hallucinations:   None   Organization:  No data recorded  [employer] of Knowledge:   Fair   Intelligence:   Average   Abstraction:   Functional   Judgement:   Fair   Dance movement psychotherapist:   Adequate   Insight:   Lacking; Denial   Decision Making:   Impulsive   Social Functioning  Social Maturity:   Impulsive   Social  Judgement:   Heedless   Stress  Stressors:   Relationship; Financial; Family conflict   Coping Ability:   Exhausted   Skill Deficits:   None   Supports:   Family     Religion: Religion/Spirituality Are You A Religious Person?: No  Leisure/Recreation: Leisure / Recreation Do You Have Hobbies?: No  Exercise/Diet: Exercise/Diet Do You Exercise?: No Have You Gained or Lost A Significant Amount of Weight in the Past Six Months?: No Do You Follow a Special Diet?: No Do You Have Any Trouble Sleeping?: No   CCA Employment/Education Employment/Work Situation: Employment / Work Situation Employment Situation: Unemployed Has Patient ever Been in Equities trader?: No  Education: Education Is Patient Currently Attending School?: No Did You Have An Individualized Education Program (IIEP): No Did You Have Any Difficulty At [employer]?: No Patient's Education Has Been Impacted by Current Illness: No   CCA Family/Childhood History Family and Relationship History: Family history Marital status: Married What types of issues is patient dealing with in the relationship?: Patient has substance abuse issues that affects his marriage Additional relationship information: None reported Does patient have children?: Yes How many children?: 2 How is patient's relationship with their children?: Unknown  Childhood History:  Childhood History Did patient suffer any verbal/emotional/physical/sexual abuse as a child?: No Did patient suffer from severe childhood neglect?: No Has patient ever been sexually abused/assaulted/raped as an adolescent or adult?: No Was the patient ever a victim of a crime or a disaster?: No Witnessed domestic violence?: No Has patient been affected by domestic violence as an adult?: No  Child/Adolescent Assessment:     CCA Substance Use Alcohol/Drug Use: Alcohol / Drug Use Pain Medications: see mar Prescriptions: see mar Over the Counter: see mar History  of alcohol / drug use?: Yes Substance #1 Name of Substance 1: Alcohol 1 - Age of First Use: Unknown 1 - Amount (size/oz): "3-4 beers" 1 - Frequency: "once a month" 1 - Last Use / Amount: 03/09/22                       ASAM's:  Six Dimensions of Multidimensional Assessment  Dimension 1:  Acute Intoxication and/or Withdrawal Potential:      Dimension 2:  Biomedical Conditions and Complications:      Dimension 3:  Emotional, Behavioral, or Cognitive Conditions and Complications:     Dimension 4:  Readiness to Change:     Dimension  5:  Relapse, Continued use, or Continued Problem Potential:     Dimension 6:  Recovery/Living Environment:     ASAM Severity Score:    ASAM Recommended Level of Treatment:     Substance use Disorder (SUD) Substance Use Disorder (SUD)  Checklist Symptoms of Substance Use: Continued use despite having a persistent/recurrent physical/psychological problem caused/exacerbated by use, Presence of craving or strong urge to use, Recurrent use that results in a failure to fulfill major role obligations (work, school, home), Persistent desire or unsuccessful efforts to cut down or control use, Continued use despite persistent or recurrent social, interpersonal problems, caused or exacerbated by use, Large amounts of time spent to obtain, use or recover from the substance(s), Substance(s) often taken in larger amounts or over longer times than was intended  Recommendations for Services/Supports/Treatments:    DSM5 Diagnoses: Patient Active Problem List   Diagnosis Date Noted   AKI (acute kidney injury) (HCC) 08/26/2021   DKA, type 1 (HCC) 08/26/2021   DKA (diabetic ketoacidosis) (HCC) 08/25/2021   Cellulitis of finger of left hand 08/12/2021   Tobacco use 08/12/2021   Other synovitis and tenosynovitis, left hand 08/12/2021   Uncontrolled type 1 diabetes mellitus with hyperglycemia, with long-term current use of insulin (HCC) 08/12/2021   Hyperosmolar  non-ketotic state due to type 2 diabetes mellitus (HCC) 08/12/2021   Pseudohyponatremia 08/12/2021   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   Psoriasis 06/22/2020   Wound infection 06/22/2020   Crushing injury of right hand 06/11/2020   Degloving injury of right hand 06/11/2020   Finger amputation, traumatic, initial encounter 06/11/2020   Fever, unspecified 03/31/2017   Viral gastroenteritis 03/30/2017   Sinus tachycardia 03/30/2017   Low back pain 02/01/2011   Mood disorder (HCC) 02/01/2011   Elevated blood pressure 02/01/2011    Patient Centered Plan: Patient is on the following Treatment Plan(s):  Substance Abuse   Referrals to Alternative Service(s): Referred to Alternative Service(s):   Place:   Date:   Time:    Referred to Alternative Service(s):   Place:   Date:   Time:    Referred to Alternative Service(s):   Place:   Date:   Time:    Referred to Alternative Service(s):   Place:   Date:   Time:      @BHCOLLABOFCARE @  [employer], LCAS-A

## 2023-01-09 NOTE — ED Notes (Signed)
Pt given lunch tray at this time

## 2023-01-09 NOTE — ED Notes (Signed)
Eric [name], mother, contacted RN and stated that fiance showed old video to the police, and that he is here "under false pretenses". She is requesting that she is contacted for collateral about patient. States that the patient is not a harm to himself or others. Best contact number, [phone].

## 2023-01-09 NOTE — ED Notes (Signed)
Patient's mother called earlier to provide collateral to psych team. Patient gives permission to discuss medical information with mother. NP notified of request. Patient instructed to let this RN know if mother has not heard back and this RN will relay to night shift team mother is requesting a call back about patient status.

## 2023-01-09 NOTE — ED Notes (Signed)
Dexcom sensor expired. Placed patient phone and dexcom reader in bag of belongings.

## 2023-01-09 NOTE — ED Notes (Signed)
Dexcom reading 399

## 2023-01-09 NOTE — ED Notes (Signed)
Pt ambulated to bathroom to preform ADL's no assistance required

## 2023-01-09 NOTE — ED Notes (Signed)
Pt given meal tray and water to drink.

## 2023-01-09 NOTE — ED Notes (Signed)
Dexcom reading 63. Pt given [place] sandwich tray - eating at this time.

## 2023-01-09 NOTE — ED Notes (Signed)
Dexcom reading 361 at this time

## 2023-01-09 NOTE — ED Provider Notes (Signed)
-----------------------------------------   2:00 AM on 01/09/2023 -----------------------------------------  Clinical Course as of 01/09/23 0209  Wed Jan 09, 2023  0106 Patient has been evaluated by psychiatry and she recommends observation in a safe environment overnight to be reassessed in the morning. [CF]  0200 Ordered diabetic (carb controlled) diet and sliding scale insulin orders [CF]    Clinical Course User Index [CF] Loleta Rose, MD      Loleta Rose, MD 01/09/23 [phone]

## 2023-01-09 NOTE — ED Notes (Signed)
Dexcom reading 270 at this time.

## 2023-01-09 NOTE — ED Notes (Addendum)
Dexcom reading of 49 at 0051. Pt given 4 cups of orange juice at this time

## 2023-01-09 NOTE — ED Notes (Signed)
Insulin pump removed

## 2023-01-09 NOTE — ED Notes (Signed)
INVOLUNTARY WITH RECOMMENDATION FOR PSYCH ADMIT WHEN MEDICALLY CLEAR

## 2023-01-09 NOTE — Inpatient Diabetes Management (Addendum)
Inpatient Diabetes Program Recommendations  AACE/ADA: New Consensus Statement on Inpatient Glycemic Control (2015)  Target Ranges:  Prepandial:   less than 140 mg/dL      Peak postprandial:   less than 180 mg/dL (1-2 hours)      Critically ill patients:  140 - 180 mg/dL    Latest Reference Range & Units 01/09/23 07:53  Glucose-Capillary 70 - 99 mg/dL 562 (H)  15 units Novolog   (H): Data is abnormally high   To ED with IVC for combative and threatening behavior with SI   History: Type 1 diabetes, Anxiety, Depression, and Bipolar   Home DM Meds: Insulin Pump  Current Orders: Novolog Moderate Correction Scale/ SSI (0-15 units) TID AC     Semglee 35 units Daily   Secure Chat to ED MD this AM and got orders to start Semglee Insulin 35 units daily (0.4 units/kg)   MD- Please consider adding Novolog Meal Coverage: Novolog 4 units TID with meals HOLD if pt NPO HOLD if pt eats <50% meals   Met w/ pt in the ED.  Pt confirms ENDO is with [facility].  Does not know insulin pump settings and stated he needs to be trained on his pump b/c it is a new pump for him.  Encouraged pt to call ENDO for pump training when he goes home.  Discussed w/ pt that we have started basal insulin and Novolog SSI.  Explained what each is and how they work.  Dexcom Sensor will expire, however, RNs in hospital will check CBGs with hospital meter.  RN has pt's phone at the desk which is what pt uses to control insulin pump and view glucose levels.  Pump removed last PM.    ENDO: Evlyn Courier, NP with [facility] Last Seen 12/20/2022 OmniPod 5 Insulin Pump Basal:  0000-12:00: 2.45 un/hr 12:00-0000: 2.45 un/hr Total basal = 58.8 units   I:C 1:12 ISF(35) Target BG(120-150)    --Will follow patient during hospitalization--  Ambrose Finland RN, MSN, CDCES Diabetes Coordinator Inpatient Glycemic Control Team Team Pager: [phone] (8a-5p)

## 2023-01-09 NOTE — ED Notes (Signed)
Dexcom reading 322 at this time.

## 2023-01-10 DIAGNOSIS — Z630 Problems in relationship with spouse or partner: Secondary | ICD-10-CM

## 2023-01-10 LAB — CBG MONITORING, ED
Glucose-Capillary: 437 mg/dL — ABNORMAL HIGH (ref 70–99)
Glucose-Capillary: 240 mg/dL — ABNORMAL HIGH (ref 70–99)
Glucose-Capillary: 101 mg/dL — ABNORMAL HIGH (ref 70–99)
Glucose-Capillary: 242 mg/dL — ABNORMAL HIGH (ref 70–99)

## 2023-01-10 MED ORDER — HYDROCODONE-ACETAMINOPHEN 5-325 MG PO TABS
1.0000 | ORAL_TABLET | Freq: Three times a day (TID) | ORAL | Status: DC | PRN
  Administered 2023-01-10 – 2023-01-12 (×3): 1 via ORAL
  Filled 2023-01-10 (×4): qty 1

## 2023-01-10 MED ORDER — PREGABALIN 100 MG PO CAPS
100.0000 mg | ORAL_CAPSULE | Freq: Three times a day (TID) | ORAL | Status: DC
  Administered 2023-01-10 – 2023-01-12 (×7): 100 mg via ORAL
  Filled 2023-01-10 (×2): qty 2
  Filled 2023-01-10: qty 1
  Filled 2023-01-10 (×5): qty 2

## 2023-01-10 NOTE — ED Provider Notes (Signed)
Emergency Medicine Observation Re-evaluation Note  Eric [name] is a 37 y.o. male, seen on rounds today.  Pt initially presented to the ED for complaints of IVC  Currently, the patient is calm, no acute complaints.  Physical Exam  Blood pressure 137/88, pulse 99, temperature 98.4 F (36.9 C), temperature source Oral, resp. rate 20, height 5\' 9"  (1.753 m), weight 86.6 kg, SpO2 95%. Physical Exam General: NAD Lungs: CTAB Psych: not agitated  ED Course / MDM  EKG:    I have reviewed the labs performed to date as well as medications administered while in observation.  Recent changes in the last 24 hours include no acute events overnight.  Pt obtained a new Dexcom sensor for his inuslin pump. Denies SI/HI  Plan  Current plan is for psych admit. May restart insulin pump for glycemic control.  Patient is under full IVC at this time.   Sharman Cheek, MD 01/10/23 [phone]

## 2023-01-10 NOTE — ED Notes (Signed)
Pt given lunch tray and beverage 
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
## 2023-01-10 NOTE — ED Notes (Signed)
Pt's father picked up a new dexcom insulin pump meter, once installed the dexcom notified that the CBG meter which is a separate unit had expired overnight.  Pt's father will pick up additional supplies from [facility] on 01/11/2023 when they open and bring supplies to the ED for Mr Shawcroft.  Pt's Father Amada Jupiter more has also asked that his family be contacted so that they can provide collateral and additional insight into Mr Davids's situation at hand.  Pt has given verbal consent to speak with father or mother.  Pt's father: Lasaro Caravantes - [phone]

## 2023-01-10 NOTE — ED Notes (Signed)
Pt given snack.

## 2023-01-10 NOTE — Progress Notes (Deleted)
[facility] MD Progress Note  01/10/2023 12:09 PM Eric [name]  MRN:  [id number]  Subjective:     Principal Problem: Marital conflict Diagnosis: Principal Problem:   Marital conflict  Total Time spent with patient: 15 minutes  Past Psychiatric History:   Past Medical History:  Past Medical History:  Diagnosis Date   Anxiety    Bipolar 2 disorder, major depressive episode (HCC)    Depression    Diabetes mellitus    Hypertension     Past Surgical History:  Procedure Laterality Date   AMPUTATION Right    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   FRACTURE SURGERY     reconstructive left knee surgery   KNEE SURGERY     Family History:  Family History  Problem Relation Age of Onset   Hypertension Mother    Hypertension Father    Family Psychiatric  History:  Social History:  Social History   Substance and Sexual Activity  Alcohol Use Not Currently     Social History   Substance and Sexual Activity  Drug Use Not Currently    Social History   Socioeconomic History   Marital status: Married    Spouse name: Not on file   Number of children: Not on file   Years of education: Not on file   Highest education level: Not on file  Occupational History   Not on file  Tobacco Use   Smoking status: Never   Smokeless tobacco: Current    Types: Snuff  Vaping Use   Vaping status: Never Used  Substance and Sexual Activity   Alcohol use: Not Currently   Drug use: Not Currently   Sexual activity: Not on file  Other Topics Concern   Not on file  Social History Narrative   Not on file   Social Determinants of Health   Financial Resource Strain: Patient Declined (04/05/2022)   Received from [facility], [facility]   Overall Financial Resource Strain (CARDIA)    Difficulty of Paying Living Expenses: Patient declined  Recent Concern: Financial Resource Strain - Medium Risk (02/28/2022)   Received from [facility], [facility]   Overall  Financial Resource Strain (CARDIA)    Difficulty of Paying Living Expenses: Somewhat hard  Food Insecurity: No Food Insecurity (04/05/2022)   Received from [facility], [facility]   Hunger Vital Sign    Worried About Running Out of Food in the Last Year: Never true    Ran Out of Food in the Last Year: Never true  Recent Concern: Food Insecurity - Food Insecurity Present (02/28/2022)   Received from [facility], [facility]   Hunger Vital Sign    Worried About Running Out of Food in the Last Year: Never true    Ran Out of Food in the Last Year: Sometimes true  Transportation Needs: No Transportation Needs (04/05/2022)   Received from [facility], [facility]   PRAPARE - Transportation    In the past 12 months, has lack of transportation kept you from medical appointments or from getting medications?: No    Lack of Transportation (Non-Medical): No  Physical Activity: Sufficiently Active (12/11/2021)   Received from [facility], [facility]   Exercise Vital Sign    Days of Exercise per Week: 2 days    Minutes of Exercise per Session: 120 min  Stress: Patient Declined (12/11/2021)   Received from [facility], [facility]  [employer] of Occupational Health - Occupational Stress Questionnaire    Feeling of Stress : Patient declined  Social Connections: Unknown (06/13/2021)   Received from [employer]   Social Network    Social Network: Not on file   Additional Social History:    Pain Medications: see mar Prescriptions: see mar Over the Counter: see mar History of alcohol / drug use?: Yes Name of Substance 1: Alcohol 1 - Age of First Use: Unknown 1 - Amount (size/oz): "3-4 beers" 1 - Frequency: "once a month" 1 - Last Use / Amount: 03/09/22                  Sleep: Good  Appetite:  Good  Current Medications: Current Facility-Administered Medications  Medication Dose Route  Frequency Provider Last Rate Last Admin   ARIPiprazole (ABILIFY) tablet 5 mg  5 mg Oral Daily Lauree Chandler, NP   5 mg at 01/10/23 0859   diazepam (VALIUM) tablet 10 mg  10 mg Oral Q12H PRN Lauree Chandler, NP   10 mg at 01/09/23 1821   escitalopram (LEXAPRO) tablet 10 mg  10 mg Oral Daily Lauree Chandler, NP   10 mg at 01/10/23 0859   HYDROcodone-acetaminophen (NORCO/VICODIN) 5-325 MG per tablet 1 tablet  1 tablet Oral Q8H PRN Sharman Cheek, MD   1 tablet at 01/10/23 1117   hydrOXYzine (ATARAX) tablet 25 mg  25 mg Oral TID Lauree Chandler, NP   25 mg at 01/10/23 0859   insulin aspart (novoLOG) injection 0-15 Units  0-15 Units Subcutaneous TID WC Loleta Rose, MD   15 Units at 01/10/23 0859   insulin glargine-yfgn (SEMGLEE) injection 35 Units  35 Units Subcutaneous Daily Concha Se, MD   35 Units at 01/10/23 0901   pregabalin (LYRICA) capsule 100 mg  100 mg Oral TID Sharman Cheek, MD   100 mg at 01/10/23 1116   Current Outpatient Medications  Medication Sig Dispense Refill   acetaminophen (TYLENOL) 500 MG tablet Take 1,000 mg by mouth every 6 (six) hours as needed for moderate pain.     ARIPiprazole (ABILIFY) 5 MG tablet Take 5 mg by mouth daily.     diazepam (VALIUM) 10 MG tablet Take 1 tablet (10 mg total) by mouth every 6 (six) hours as needed. (Patient taking differently: Take 10 mg by mouth every 6 (six) hours as needed for anxiety.) 30 tablet 0   escitalopram (LEXAPRO) 10 MG tablet Take 10 mg by mouth daily.     hydrOXYzine (VISTARIL) 25 MG capsule Take 25 mg by mouth 3 (three) times daily.     insulin lispro (HUMALOG) 100 UNIT/ML injection Inject 1-10 Units into the skin 3 (three) times daily before meals. (Patient taking differently: 1-10 Units 3 (three) times daily with meals. Based on sliding scale/carb intake-via pump) 10 mL 12   meloxicam (MOBIC) 7.5 MG tablet Take 1 tablet (7.5 mg total) by mouth daily. 60 tablet 5   naloxone (NARCAN) nasal spray 4 mg/0.1  mL Place 1 spray into the nose once.     phentermine 15 MG capsule Take 15 mg by mouth in the morning.     pregabalin (LYRICA) 100 MG capsule Take 100 mg by mouth 3 (three) times daily.     HYDROcodone-acetaminophen (NORCO/VICODIN) 5-325 MG tablet Take 1 tablet by mouth every 6 (six) hours as needed. (Patient not taking: Reported on 03/30/2022) 15 tablet 0    Lab Results:  Results for orders placed or performed  during the hospital encounter of 01/08/23 (from the past 48 hour(s))  Comprehensive metabolic panel     Status: Abnormal   Collection Time: 01/08/23 10:05 PM  Result Value Ref Range   Sodium 138 135 - 145 mmol/L   Potassium 3.8 3.5 - 5.1 mmol/L   Chloride 105 98 - 111 mmol/L   CO2 25 22 - 32 mmol/L   Glucose, Bld 100 (H) 70 - 99 mg/dL    Comment: Glucose reference range applies only to samples taken after fasting for at least 8 hours.   BUN 10 6 - 20 mg/dL   Creatinine, Ser 1.61 0.61 - 1.24 mg/dL   Calcium 9.1 8.9 - 09.6 mg/dL   Total Protein 7.8 6.5 - 8.1 g/dL   Albumin 4.5 3.5 - 5.0 g/dL   AST 15 15 - 41 U/L   ALT 23 0 - 44 U/L   Alkaline Phosphatase 84 38 - 126 U/L   Total Bilirubin 0.8 <1.2 mg/dL   GFR, Estimated >04 >54 mL/min    Comment: (NOTE) Calculated using the CKD-EPI Creatinine Equation (2021)    Anion gap 8 5 - 15    Comment: Performed at [facility], [address], [place], [place] [address]  Ethanol     Status: Abnormal   Collection Time: 01/08/23 10:05 PM  Result Value Ref Range   Alcohol, Ethyl (B) 72 (H) <10 mg/dL    Comment: (NOTE) Lowest detectable limit for serum alcohol is 10 mg/dL.  For medical purposes only. Performed at [facility], [address] [place], [place] [address]   Salicylate level     Status: Abnormal   Collection Time: 01/08/23 10:05 PM  Result Value Ref Range   Salicylate Lvl <7.0 (L) 7.0 - 30.0 mg/dL    Comment: Performed at [facility], [address] [place], [place] [address]   Acetaminophen level     Status: Abnormal   Collection Time: 01/08/23 10:05 PM  Result Value Ref Range   Acetaminophen (Tylenol), Serum <10 (L) 10 - 30 ug/mL    Comment: (NOTE) Therapeutic concentrations vary significantly. A range of 10-30 ug/mL  may be an effective concentration for many patients. However, some  are best treated at concentrations outside of this range. Acetaminophen concentrations >150 ug/mL at 4 hours after ingestion  and >50 ug/mL at 12 hours after ingestion are often associated with  toxic reactions.  Performed at [facility], [address] [place], [place] [address]   cbc     Status: None   Collection Time: 01/08/23 10:05 PM  Result Value Ref Range   WBC 7.6 4.0 - 10.5 K/uL   RBC 5.66 4.22 - 5.81 MIL/uL   Hemoglobin 16.4 13.0 - 17.0 g/dL   HCT 57.8 46.9 - 62.9 %   MCV 83.0 80.0 - 100.0 fL   MCH 29.0 26.0 - 34.0 pg   MCHC 34.9 30.0 - 36.0 g/dL   RDW 52.8 41.3 - 24.4 %   Platelets 248 150 - 400 K/uL   nRBC 0.0 0.0 - 0.2 %    Comment: Performed at [facility], [address] [place], [place] [address]  Urine Drug Screen, Qualitative     Status: Abnormal   Collection Time: 01/08/23 10:05 PM  Result Value Ref Range   Tricyclic, Ur Screen NONE DETECTED NONE DETECTED   Amphetamines, Ur Screen NONE DETECTED NONE DETECTED   MDMA (Ecstasy)Ur Screen NONE DETECTED NONE DETECTED   Cocaine Metabolite,Ur [place] NONE DETECTED  NONE DETECTED   Opiate, Ur Screen POSITIVE (A) NONE DETECTED   Phencyclidine (PCP) Ur S NONE DETECTED NONE DETECTED   Cannabinoid 50 Ng, Ur [place] NONE DETECTED NONE DETECTED   Barbiturates, Ur Screen NONE DETECTED NONE DETECTED   Benzodiazepine, Ur Scrn POSITIVE (A) NONE DETECTED   Methadone Scn, Ur NONE DETECTED NONE DETECTED    Comment: (NOTE) Tricyclics + metabolites, urine    Cutoff 1000 ng/mL Amphetamines + metabolites, urine  Cutoff 1000 ng/mL MDMA (Ecstasy), urine              Cutoff 500 ng/mL Cocaine Metabolite,  urine          Cutoff 300 ng/mL Opiate + metabolites, urine        Cutoff 300 ng/mL Phencyclidine (PCP), urine         Cutoff 25 ng/mL Cannabinoid, urine                 Cutoff 50 ng/mL Barbiturates + metabolites, urine  Cutoff 200 ng/mL Benzodiazepine, urine              Cutoff 200 ng/mL Methadone, urine                   Cutoff 300 ng/mL  The urine drug screen provides only a preliminary, unconfirmed analytical test result and should not be used for non-medical purposes. Clinical consideration and professional judgment should be applied to any positive drug screen result due to possible interfering substances. A more specific alternate chemical method must be used in order to obtain a confirmed analytical result. Gas chromatography / mass spectrometry (GC/MS) is the preferred confirm atory method. Performed at [facility], [address] [place], [place] [address]   CBG monitoring, ED     Status: Abnormal   Collection Time: 01/09/23  7:53 AM  Result Value Ref Range   Glucose-Capillary 411 (H) 70 - 99 mg/dL    Comment: Glucose reference range applies only to samples taken after fasting for at least 8 hours.  CBG monitoring, ED     Status: Abnormal   Collection Time: 01/09/23 11:15 AM  Result Value Ref Range   Glucose-Capillary 202 (H) 70 - 99 mg/dL    Comment: Glucose reference range applies only to samples taken after fasting for at least 8 hours.  CBG monitoring, ED     Status: Abnormal   Collection Time: 01/09/23  4:32 PM  Result Value Ref Range   Glucose-Capillary 240 (H) 70 - 99 mg/dL    Comment: Glucose reference range applies only to samples taken after fasting for at least 8 hours.  CBG monitoring, ED     Status: Abnormal   Collection Time: 01/09/23 10:48 PM  Result Value Ref Range   Glucose-Capillary 374 (H) 70 - 99 mg/dL    Comment: Glucose reference range applies only to samples taken after fasting for at least 8 hours.  CBG monitoring, ED     Status:  Abnormal   Collection Time: 01/10/23  7:44 AM  Result Value Ref Range   Glucose-Capillary 437 (H) 70 - 99 mg/dL    Comment: Glucose reference range applies only to samples taken after fasting for at least 8 hours.  CBG monitoring, ED     Status: Abnormal   Collection Time: 01/10/23 11:32 AM  Result Value Ref Range   Glucose-Capillary 240 (H) 70 - 99 mg/dL    Comment: Glucose reference range applies only to samples taken after fasting for at least 8  hours.    Blood Alcohol level:  Lab Results  Component Value Date   ETH 72 (H) 01/08/2023   ETH 206 (H) 09/21/2021    Metabolic Disorder Labs: Lab Results  Component Value Date   HGBA1C 10.4 (H) 08/26/2021   MPG 251.78 08/26/2021   MPG 188.64 03/30/2017   No results found for: "PROLACTIN" No results found for: "CHOL", "TRIG", "HDL", "CHOLHDL", "VLDL", "LDLCALC"  Physical Findings: AIMS:  , ,  ,  ,    CIWA:    COWS:     Musculoskeletal: Strength & Muscle Tone: within normal limits Gait & Station: normal Patient leans: N/A  Psychiatric Specialty Exam:  Presentation  General Appearance:  Appropriate for Environment  Eye Contact: Good  Speech: Clear and Coherent; Normal Rate  Speech Volume: Normal  Handedness: Right   Mood and Affect  Mood: Euthymic  Affect: Blunt   Thought Process  Thought Processes: Coherent; Goal Directed; Linear  Descriptions of Associations:Intact  Orientation:Full (Time, Place and Person)  Thought Content:Logical  History of Schizophrenia/Schizoaffective disorder:No  Duration of Psychotic Symptoms:No data recorded Hallucinations:Hallucinations: None  Ideas of Reference:None  Suicidal Thoughts:Suicidal Thoughts: No  Homicidal Thoughts:Homicidal Thoughts: No   Sensorium  Memory: Immediate Good  Judgment: Intact  Insight: Shallow   Executive Functions  Concentration: Good  Attention Span: Good  Recall: Fair  Fund of  Knowledge: Good  Language: Good   Psychomotor Activity  Psychomotor Activity: Psychomotor Activity: Normal   Assets  Assets: Communication Skills; Financial Resources/Insurance; Resilience   Sleep  Sleep: Sleep: Good   Physical Exam: Physical Exam Vitals and nursing note reviewed.  Cardiovascular:     Rate and Rhythm: Normal rate and regular rhythm.  Neurological:     Mental Status: He is alert.  Psychiatric:        Mood and Affect: Mood normal.        Behavior: Behavior normal.    Review of Systems  Psychiatric/Behavioral:  Positive for depression. Negative for suicidal ideas. The patient is nervous/anxious.   All other systems reviewed and are negative.  Blood pressure 137/88, pulse 99, temperature 98.4 F (36.9 C), temperature source Oral, resp. rate 20, height 5\' 9"  (1.753 m), weight 86.6 kg, SpO2 95%. Body mass index is 28.21 kg/m.   Treatment Plan Summary: Daily contact with patient to assess and evaluate symptoms and progress in treatment and Medication management  Mood disorder: Major depressive disorder Generalized anxiety disorder:  Continue Abilify 5 mg, Lexapro 10 mg and hydroxyzine 25 mg p.o. 3 times daily as needed Will continue to recommend inpatient admission  Oneta Rack, NP 01/10/2023, 12:09 PM

## 2023-01-10 NOTE — ED Notes (Signed)
Pt continues to be calm and cooperative.  Pt denies alligations made by spouse.  During nursing assessment Mr Spearin was A/Ox 4 .  He  stated that he is not currently have thoughts or feelings of SI/HI.  Mr Randlett reported that he has never had having auditory or visual hallucinations.  Pt affect is congruent with situation , eye contact is good , speech is of normal rate and volume  with appropriate verbiage noted.  Staff addressed any feelings or concerns that have been brought up.  Medications were administered as ordered. 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  He currently controls his glucose levels at home with a pump and monitor.  Pt's dexcom monitor has expired therefor staff removed his pump >24hrs ago.  Mr Freda Mendosa continues to be above the range required for inpatient admission.  Staff spoke to pt's mother who stated that she would "try to bring a new dexcom monitor up sometime in the next few days" Continue to monitor patient as ordered for any changes in behaviors and for continued safety.

## 2023-01-10 NOTE — ED Notes (Signed)
PT IVC/ REC. PSYCH INPATIENT WHEN MED. CLEARED.

## 2023-01-10 NOTE — ED Notes (Signed)
IVC/ Rec psych inpt admission when medically cleared

## 2023-01-11 DIAGNOSIS — Z630 Problems in relationship with spouse or partner: Secondary | ICD-10-CM | POA: Diagnosis not present

## 2023-01-11 LAB — CBG MONITORING, ED
Glucose-Capillary: 466 mg/dL — ABNORMAL HIGH (ref 70–99)
Glucose-Capillary: 263 mg/dL — ABNORMAL HIGH (ref 70–99)
Glucose-Capillary: 76 mg/dL (ref 70–99)
Glucose-Capillary: 223 mg/dL — ABNORMAL HIGH (ref 70–99)

## 2023-01-11 MED ORDER — INSULIN ASPART 100 UNIT/ML IJ SOLN
4.0000 [IU] | Freq: Three times a day (TID) | INTRAMUSCULAR | Status: DC
  Administered 2023-01-11 – 2023-01-12 (×3): 4 [IU] via SUBCUTANEOUS
  Filled 2023-01-11 (×2): qty 1

## 2023-01-11 MED ORDER — INSULIN ASPART 100 UNIT/ML IJ SOLN
0.0000 [IU] | Freq: Every day | INTRAMUSCULAR | Status: DC
Start: 2023-01-11 — End: 2023-01-12
  Administered 2023-01-11: 2 [IU] via SUBCUTANEOUS
  Filled 2023-01-11: qty 1
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
## 2023-01-11 NOTE — Inpatient Diabetes Management (Signed)
Inpatient Diabetes Program Recommendations  AACE/ADA: New Consensus Statement on Inpatient Glycemic Control  Target Ranges:  Prepandial:   less than 140 mg/dL      Peak postprandial:   less than 180 mg/dL (1-2 hours)      Critically ill patients:  140 - 180 mg/dL    Latest Reference Range & Units 01/10/23 07:44 01/10/23 11:32 01/10/23 16:16 01/10/23 21:21 01/11/23 08:39  Glucose-Capillary 70 - 99 mg/dL 409 (H) 811 (H) 914 (H) 242 (H) 466 (H)    Review of Glycemic Control  Diabetes history: DM1 Outpatient Diabetes medications: OmniPod insulin pump with Dexcom G6 CGM Current orders for Inpatient glycemic control: Semglee 35 units daily, Novolog 0-15 units TID with meals  Inpatient Diabetes Program Recommendations:    Insulin: Please consider ordering Novolog 0-5 units at bedtime and Novolog 4 units TID with meals for meal coverage if patient eats at least 50% of meals.  Thanks, Orlando Penner, RN, MSN, CDCES Diabetes Coordinator Inpatient Diabetes Program [phone] (Team Pager from 8am to 5pm)

## 2023-01-11 NOTE — ED Notes (Signed)
IVC/pending psych inpatient admission 

## 2023-01-11 NOTE — ED Provider Notes (Signed)
Procedures  Clinical Course as of 01/11/23 0940  Wed Jan 09, 2023  0106 Patient has been evaluated by psychiatry and she recommends observation in a safe environment overnight to be reassessed in the morning. [CF]  0200 Ordered diabetic (carb controlled) diet and sliding scale insulin orders [CF]    Clinical Course User Index [CF] Loleta Rose, MD    ----------------------------------------- 9:40 AM on 01/11/2023 ----------------------------------------- D/w psych team - need improved glycemic control for pt to be suitable for inpatient psych unit. Insulin pump has been paused due to needing fresh supplies, which family was going to bring. He is receiving semglee and SSI currently     Sharman Cheek, MD 01/11/23 [phone]

## 2023-01-11 NOTE — ED Notes (Signed)
Pt given lunch tray- 75% of breakfast tray eaten

## 2023-01-11 NOTE — ED Provider Notes (Signed)
Emergency Medicine Observation Re-evaluation Note  Eric [name] is a 37 y.o. male, seen on rounds today.  Pt initially presented to the ED for complaints of IVC Currently, the patient is resting, voices no medical complaints.  Physical Exam  BP 111/73 (BP Location: Right Arm)   Pulse 88   Temp 98.6 F (37 C) (Oral)   Resp 16   Ht 5\' 9"  (1.753 m)   Wt 86.6 kg   SpO2 94%   BMI 28.21 kg/m  Physical Exam General: Resting in no acute distress Cardiac: No cyanosis Lungs: Equal rise and fall Psych: Not agitated  ED Course / MDM  EKG:   I have reviewed the labs performed to date as well as medications administered while in observation.  Recent changes in the last 24 hours include no events overnight.  Plan  Current plan is for Psychiatric disposition.    Irean Hong, MD 01/11/23 [phone]

## 2023-01-11 NOTE — ED Notes (Signed)
IVC moved to BHU1, pending psych admit

## 2023-01-12 ENCOUNTER — Other Ambulatory Visit: Payer: Self-pay

## 2023-01-12 ENCOUNTER — Inpatient Hospital Stay
Admission: AD | Admit: 2023-01-12 | Discharge: 2023-01-15 | DRG: 885 | Disposition: A | Discharge status: Home / Self Care | Payer: Medicaid Other | Source: Intra-hospital | Attending: Psychiatry | Admitting: Psychiatry

## 2023-01-12 ENCOUNTER — Encounter: Payer: Self-pay | Admitting: Psychiatry

## 2023-01-12 DIAGNOSIS — F419 Anxiety disorder, unspecified: Secondary | ICD-10-CM | POA: Insufficient documentation

## 2023-01-12 DIAGNOSIS — E1042 Type 1 diabetes mellitus with diabetic polyneuropathy: Secondary | ICD-10-CM | POA: Diagnosis present

## 2023-01-12 DIAGNOSIS — F3110 Bipolar disorder, current episode manic without psychotic features, unspecified: Principal | ICD-10-CM | POA: Diagnosis present

## 2023-01-12 DIAGNOSIS — Z79891 Long term (current) use of opiate analgesic: Secondary | ICD-10-CM

## 2023-01-12 DIAGNOSIS — F1320 Sedative, hypnotic or anxiolytic dependence, uncomplicated: Secondary | ICD-10-CM | POA: Diagnosis present

## 2023-01-12 DIAGNOSIS — Z79899 Other long term (current) drug therapy: Secondary | ICD-10-CM | POA: Diagnosis not present

## 2023-01-12 DIAGNOSIS — M545 Low back pain, unspecified: Secondary | ICD-10-CM | POA: Diagnosis present

## 2023-01-12 DIAGNOSIS — R45851 Suicidal ideations: Secondary | ICD-10-CM | POA: Diagnosis present

## 2023-01-12 DIAGNOSIS — Z5986 Financial insecurity: Secondary | ICD-10-CM

## 2023-01-12 DIAGNOSIS — E1065 Type 1 diabetes mellitus with hyperglycemia: Principal | ICD-10-CM | POA: Diagnosis present

## 2023-01-12 DIAGNOSIS — F1120 Opioid dependence, uncomplicated: Secondary | ICD-10-CM | POA: Diagnosis present

## 2023-01-12 DIAGNOSIS — Z8249 Family history of ischemic heart disease and other diseases of the circulatory system: Secondary | ICD-10-CM

## 2023-01-12 DIAGNOSIS — G894 Chronic pain syndrome: Secondary | ICD-10-CM | POA: Diagnosis present

## 2023-01-12 DIAGNOSIS — F10139 Alcohol abuse with withdrawal, unspecified: Secondary | ICD-10-CM | POA: Diagnosis present

## 2023-01-12 DIAGNOSIS — I10 Essential (primary) hypertension: Secondary | ICD-10-CM | POA: Diagnosis present

## 2023-01-12 DIAGNOSIS — G4700 Insomnia, unspecified: Secondary | ICD-10-CM | POA: Diagnosis present

## 2023-01-12 DIAGNOSIS — Z630 Problems in relationship with spouse or partner: Secondary | ICD-10-CM | POA: Diagnosis not present

## 2023-01-12 DIAGNOSIS — F32A Depression, unspecified: Secondary | ICD-10-CM

## 2023-01-12 DIAGNOSIS — Z23 Encounter for immunization: Secondary | ICD-10-CM

## 2023-01-12 DIAGNOSIS — F17290 Nicotine dependence, other tobacco product, uncomplicated: Secondary | ICD-10-CM | POA: Diagnosis present

## 2023-01-12 DIAGNOSIS — F3181 Bipolar II disorder: Principal | ICD-10-CM

## 2023-01-12 DIAGNOSIS — G629 Polyneuropathy, unspecified: Secondary | ICD-10-CM | POA: Diagnosis not present

## 2023-01-12 LAB — GLUCOSE, CAPILLARY
Glucose-Capillary: 102 mg/dL — ABNORMAL HIGH (ref 70–99)
Glucose-Capillary: 285 mg/dL — ABNORMAL HIGH (ref 70–99)

## 2023-01-12 LAB — CBG MONITORING, ED
Glucose-Capillary: 139 mg/dL — ABNORMAL HIGH (ref 70–99)
Glucose-Capillary: 200 mg/dL — ABNORMAL HIGH (ref 70–99)

## 2023-01-12 MED ORDER — MELOXICAM 7.5 MG PO TABS
7.5000 mg | ORAL_TABLET | Freq: Every day | ORAL | Status: DC
  Administered 2023-01-12 – 2023-01-15 (×4): 7.5 mg via ORAL
  Filled 2023-01-12 (×4): qty 1

## 2023-01-12 MED ORDER — ESCITALOPRAM OXALATE 10 MG PO TABS
10.0000 mg | ORAL_TABLET | Freq: Every day | ORAL | Status: DC
  Administered 2023-01-13: 10 mg via ORAL
  Filled 2023-01-12: qty 1

## 2023-01-12 MED ORDER — CLONAZEPAM 0.5 MG PO TABS: 0.5000 mg | ORAL_TABLET | Freq: Once | ORAL | Status: DC

## 2023-01-12 MED ORDER — HYDROXYZINE HCL 25 MG PO TABS
25.0000 mg | ORAL_TABLET | Freq: Three times a day (TID) | ORAL | Status: DC | PRN
  Administered 2023-01-12 – 2023-01-14 (×4): 25 mg via ORAL
  Filled 2023-01-12 (×4): qty 1

## 2023-01-12 MED ORDER — CLONAZEPAM 1 MG PO TABS
2.0000 mg | ORAL_TABLET | Freq: Two times a day (BID) | ORAL | Status: AC
  Administered 2023-01-12 – 2023-01-13 (×2): 2 mg via ORAL
  Filled 2023-01-12 (×2): qty 2

## 2023-01-12 MED ORDER — ALUM & MAG HYDROXIDE-SIMETH 200-200-20 MG/5ML PO SUSP: 30.0000 mL | ORAL | Status: DC | PRN

## 2023-01-12 MED ORDER — LORAZEPAM 2 MG PO TABS
0.0000 mg | ORAL_TABLET | Freq: Four times a day (QID) | ORAL | Status: DC
Start: 2023-01-12 — End: 2023-01-14
  Administered 2023-01-13: 2 mg via ORAL
  Filled 2023-01-12 (×2): qty 1

## 2023-01-12 MED ORDER — ARIPIPRAZOLE 5 MG PO TABS
5.0000 mg | ORAL_TABLET | Freq: Every day | ORAL | Status: DC
  Administered 2023-01-13 – 2023-01-15 (×3): 5 mg via ORAL
  Filled 2023-01-12 (×3): qty 1

## 2023-01-12 MED ORDER — OLANZAPINE 10 MG IM SOLR: 10.0000 mg | Freq: Three times a day (TID) | INTRAMUSCULAR | Status: DC | PRN

## 2023-01-12 MED ORDER — LORAZEPAM 2 MG PO TABS
0.0000 mg | ORAL_TABLET | Freq: Two times a day (BID) | ORAL | Status: DC
Start: 2023-01-14 — End: 2023-01-16

## 2023-01-12 MED ORDER — OLANZAPINE 5 MG PO TBDP: 5.0000 mg | ORAL_TABLET | Freq: Three times a day (TID) | ORAL | Status: DC | PRN

## 2023-01-12 MED ORDER — INFLUENZA VIRUS VACC SPLIT PF (FLUZONE) 0.5 ML IM SUSY
0.5000 mL | PREFILLED_SYRINGE | INTRAMUSCULAR | Status: AC
  Administered 2023-01-13: 0.5 mL via INTRAMUSCULAR
  Filled 2023-01-12: qty 0.5

## 2023-01-12 MED ORDER — FOLIC ACID 1 MG PO TABS
1.0000 mg | ORAL_TABLET | Freq: Every day | ORAL | Status: DC
  Administered 2023-01-12 – 2023-01-15 (×4): 1 mg via ORAL
  Filled 2023-01-12 (×4): qty 1

## 2023-01-12 MED ORDER — LORAZEPAM 2 MG/ML IJ SOLN: 1.0000 mg | INTRAMUSCULAR | Status: AC | PRN

## 2023-01-12 MED ORDER — OLANZAPINE 10 MG IM SOLR: 5.0000 mg | Freq: Three times a day (TID) | INTRAMUSCULAR | Status: DC | PRN

## 2023-01-12 MED ORDER — MAGNESIUM HYDROXIDE 400 MG/5ML PO SUSP: 30.0000 mL | Freq: Every day | ORAL | Status: DC | PRN

## 2023-01-12 MED ORDER — THIAMINE MONONITRATE 100 MG PO TABS
100.0000 mg | ORAL_TABLET | Freq: Every day | ORAL | Status: DC
  Administered 2023-01-12 – 2023-01-15 (×4): 100 mg via ORAL
  Filled 2023-01-12 (×4): qty 1

## 2023-01-12 MED ORDER — CLONAZEPAM 0.5 MG PO TABS
0.5000 mg | ORAL_TABLET | Freq: Two times a day (BID) | ORAL | Status: AC
  Administered 2023-01-14 – 2023-01-15 (×2): 0.5 mg via ORAL
  Filled 2023-01-12 (×2): qty 1

## 2023-01-12 MED ORDER — LORAZEPAM 1 MG PO TABS: 1.0000 mg | ORAL_TABLET | ORAL | Status: AC | PRN

## 2023-01-12 MED ORDER — INSULIN ASPART 100 UNIT/ML IJ SOLN
0.0000 [IU] | Freq: Three times a day (TID) | INTRAMUSCULAR | Status: DC
  Administered 2023-01-13: 20 [IU] via SUBCUTANEOUS

## 2023-01-12 MED ORDER — INSULIN ASPART 100 UNIT/ML IJ SOLN
0.0000 [IU] | Freq: Every day | INTRAMUSCULAR | Status: DC
  Administered 2023-01-12: 3 [IU] via SUBCUTANEOUS
  Filled 2023-01-12: qty 1

## 2023-01-12 MED ORDER — PREGABALIN 50 MG PO CAPS
100.0000 mg | ORAL_CAPSULE | Freq: Two times a day (BID) | ORAL | Status: DC
  Administered 2023-01-12 – 2023-01-15 (×6): 100 mg via ORAL
  Filled 2023-01-12 (×6): qty 2

## 2023-01-12 MED ORDER — ACETAMINOPHEN 325 MG PO TABS
650.0000 mg | ORAL_TABLET | Freq: Four times a day (QID) | ORAL | Status: DC | PRN
  Administered 2023-01-12: 650 mg via ORAL
  Filled 2023-01-12: qty 2

## 2023-01-12 MED ORDER — THIAMINE HCL 100 MG/ML IJ SOLN: 100.0000 mg | Freq: Every day | INTRAMUSCULAR | Status: DC

## 2023-01-12 MED ORDER — CLONAZEPAM 1 MG PO TABS
1.0000 mg | ORAL_TABLET | Freq: Two times a day (BID) | ORAL | Status: AC
  Administered 2023-01-13 – 2023-01-14 (×2): 1 mg via ORAL
  Filled 2023-01-12 (×2): qty 1

## 2023-01-12 MED ORDER — ADULT MULTIVITAMIN W/MINERALS CH
1.0000 | ORAL_TABLET | Freq: Every day | ORAL | Status: DC
  Administered 2023-01-12 – 2023-01-15 (×4): 1 via ORAL
  Filled 2023-01-12 (×4): qty 1

## 2023-01-12 NOTE — ED Notes (Signed)
IVC/ recommendation for inpatient psychiatric admission.

## 2023-01-12 NOTE — Plan of Care (Signed)
  Problem: Education: Goal: Knowledge of [place] [facility] Education information/materials will improve Outcome: Progressing   

## 2023-01-12 NOTE — ED Provider Notes (Signed)
Emergency Medicine Observation Re-evaluation Note  Eric [name] is a 37 y.o. male, seen on rounds today.  Pt initially presented to the ED for complaints of IVC Currently, the patient is resting comfortably.  Physical Exam  BP 128/85   Pulse 91   Temp 99.4 F (37.4 C) (Oral)   Resp 16   Ht 5\' 9"  (1.753 m)   Wt 86.6 kg   SpO2 94%   BMI 28.21 kg/m  Physical Exam General: No acute distress  ED Course / MDM  EKG:   I have reviewed the labs performed to date as well as medications administered while in observation.  Recent changes in the last 24 hours include no acute events.  Plan  Current plan is for inpatient psychiatric admission pending bed placement.    Janith Lima, MD 01/12/23 [phone]

## 2023-01-12 NOTE — Group Note (Signed)
Date:  01/12/2023 Time:  8:31 PM  Group Topic/Focus:  Wrap-Up Group:   The focus of this group is to help patients review their daily goal of treatment and discuss progress on daily workbooks.    Participation Level:  Active  Participation Quality:  Appropriate and Attentive  Affect:  Appropriate  Cognitive:  Alert and Appropriate  Insight: Appropriate  Engagement in Group:  Engaged  Modes of Intervention:  Discussion  Additional Comments:     [name],Eric Chiu E 01/12/2023, 8:31 PM

## 2023-01-12 NOTE — ED Notes (Signed)
Hospital meal provided, pt tolerated w/o complaints.  Waste discarded appropriately.  

## 2023-01-12 NOTE — ED Notes (Signed)
Pt is A/Ox 4, Mr Eric [name] declines any SI/HI stated that he has never had any A/V hallucinations.  Inpatient recommendation reviewed, Mr Desorbo verbalized understanding.  All Belongings accounted for and given to security to transport to BMU to PT.

## 2023-01-12 NOTE — Group Note (Signed)
Date:  01/12/2023 Time:  5:21 PM  Group Topic/Focus:  Rediscovering Joy:   The focus of this group is to explore various ways to relieve stress in a positive manner.    Participation Level:  Did Not Attend   Lynelle Smoke [facility] 01/12/2023, 5:21 PM

## 2023-01-12 NOTE — ED Notes (Signed)
 Eric [name] continues to present at baseline.  Calm, and compliant with medications. Cont to monitor as ordered.

## 2023-01-13 DIAGNOSIS — E1065 Type 1 diabetes mellitus with hyperglycemia: Secondary | ICD-10-CM | POA: Diagnosis not present

## 2023-01-13 DIAGNOSIS — G894 Chronic pain syndrome: Secondary | ICD-10-CM | POA: Insufficient documentation

## 2023-01-13 DIAGNOSIS — F32A Depression, unspecified: Secondary | ICD-10-CM

## 2023-01-13 DIAGNOSIS — G629 Polyneuropathy, unspecified: Secondary | ICD-10-CM

## 2023-01-13 DIAGNOSIS — R45851 Suicidal ideations: Secondary | ICD-10-CM

## 2023-01-13 DIAGNOSIS — F1320 Sedative, hypnotic or anxiolytic dependence, uncomplicated: Secondary | ICD-10-CM | POA: Insufficient documentation

## 2023-01-13 DIAGNOSIS — F419 Anxiety disorder, unspecified: Secondary | ICD-10-CM | POA: Diagnosis not present

## 2023-01-13 DIAGNOSIS — Z79891 Long term (current) use of opiate analgesic: Secondary | ICD-10-CM

## 2023-01-13 LAB — GLUCOSE, CAPILLARY
Glucose-Capillary: 265 mg/dL — ABNORMAL HIGH (ref 70–99)
Glucose-Capillary: 377 mg/dL — ABNORMAL HIGH (ref 70–99)
Glucose-Capillary: 299 mg/dL — ABNORMAL HIGH (ref 70–99)
Glucose-Capillary: 183 mg/dL — ABNORMAL HIGH (ref 70–99)
Glucose-Capillary: 406 mg/dL — ABNORMAL HIGH (ref 70–99)

## 2023-01-13 LAB — LIPID PANEL
Cholesterol: 193 mg/dL (ref 0–200)
HDL: 30 mg/dL — ABNORMAL LOW (ref >40)
LDL Cholesterol: 84 mg/dL (ref 0–99)
Total CHOL/HDL Ratio: 6.4 {ratio}
Triglycerides: 396 mg/dL — ABNORMAL HIGH (ref <150)
VLDL: 79 mg/dL — ABNORMAL HIGH (ref 0–40)

## 2023-01-13 MED ORDER — TRAZODONE HCL 50 MG PO TABS
150.0000 mg | ORAL_TABLET | Freq: Every day | ORAL | Status: DC
  Administered 2023-01-13 – 2023-01-14 (×2): 150 mg via ORAL
  Filled 2023-01-13 (×2): qty 1

## 2023-01-13 MED ORDER — INSULIN ASPART 100 UNIT/ML IJ SOLN
20.0000 [IU] | Freq: Once | INTRAMUSCULAR | Status: AC
  Administered 2023-01-13: 20 [IU] via SUBCUTANEOUS

## 2023-01-13 MED ORDER — INSULIN GLARGINE-YFGN 100 UNIT/ML ~~LOC~~ SOLN
35.0000 [IU] | Freq: Every day | SUBCUTANEOUS | Status: DC
  Administered 2023-01-13 – 2023-01-15 (×3): 35 [IU] via SUBCUTANEOUS
  Filled 2023-01-13 (×3): qty 0.35

## 2023-01-13 MED ORDER — INSULIN GLARGINE-YFGN 100 UNIT/ML ~~LOC~~ SOLN
35.0000 [IU] | Freq: Every day | SUBCUTANEOUS | Status: DC
  Filled 2023-01-13: qty 0.35

## 2023-01-13 MED ORDER — HYDROCODONE-ACETAMINOPHEN 5-325 MG PO TABS
1.0000 | ORAL_TABLET | Freq: Four times a day (QID) | ORAL | Status: DC | PRN
  Administered 2023-01-13 – 2023-01-15 (×3): 1 via ORAL
  Filled 2023-01-13 (×3): qty 1

## 2023-01-13 MED ORDER — DULOXETINE HCL 20 MG PO CPEP
20.0000 mg | ORAL_CAPSULE | Freq: Every day | ORAL | Status: DC
  Administered 2023-01-13 – 2023-01-15 (×3): 20 mg via ORAL
  Filled 2023-01-13 (×3): qty 1

## 2023-01-13 MED ORDER — INSULIN ASPART 100 UNIT/ML IJ SOLN
0.0000 [IU] | INTRAMUSCULAR | Status: DC
  Administered 2023-01-13: 20 [IU] via SUBCUTANEOUS
  Filled 2023-01-13: qty 1

## 2023-01-13 MED ORDER — INSULIN ASPART 100 UNIT/ML IJ SOLN
4.0000 [IU] | Freq: Three times a day (TID) | INTRAMUSCULAR | Status: DC
  Administered 2023-01-13 (×2): 4 [IU] via SUBCUTANEOUS

## 2023-01-13 NOTE — Consult Note (Signed)
Initial Consultation Note        [place]   PATIENT NAME: Eric [name]    MR#:  [id number]  DATE OF BIRTH:  01-31-1986  DATE OF ADMISSION:  01/12/2023  PRIMARY CARE PHYSICIAN: Roe Rutherford, NP   REQUESTING/REFERRING PHYSICIAN: Dr. Renato Shin  CHIEF COMPLAINT:  Elevated blood glucose  HISTORY OF PRESENT ILLNESS:  QAASIM KELLIS is a 37 y.o. Caucasian male with medical history significant for anxiety, bipolar 2 disorder, type diabetes mellitus and hypertension, who was admitted to behavioral health for suicidal thoughts after an argument with his wife.  He has been having mild polyuria and polydipsia with elevated blood glucose of 406.  He has been compliant with diabetic regimen.  He denied any chest pain or palpitations.  No fever or chills.  No dyspnea or cough or wheezing.  No nausea or vomiting or diarrhea or abdominal pain.  No bleeding diathesis.  I was consulted for medical evaluation and management.  PAST MEDICAL HISTORY:   Past Medical History:  Diagnosis Date   Anxiety    Bipolar 2 disorder, major depressive episode (HCC)    Depression    Diabetes mellitus    Hypertension     PAST SURGICAL HISTORY:   Past Surgical History:  Procedure Laterality Date   AMPUTATION Right    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   FRACTURE SURGERY     reconstructive left knee surgery   KNEE SURGERY      SOCIAL HISTORY:   Social History   Tobacco Use   Smoking status: Never   Smokeless tobacco: Current    Types: Snuff  Substance Use Topics   Alcohol use: Not Currently    FAMILY HISTORY:   Family History  Problem Relation Age of Onset   Hypertension Mother    Hypertension Father     DRUG ALLERGIES:  No Known Allergies  REVIEW OF SYSTEMS:   ROS As per history of present illness. All pertinent systems were reviewed above. Constitutional, HEENT, cardiovascular, respiratory, GI, GU, musculoskeletal, neuro, psychiatric, endocrine, integumentary and hematologic  systems were reviewed and are otherwise negative/unremarkable except for positive findings mentioned above in the HPI.   MEDICATIONS AT HOME:   Prior to Admission medications   Medication Sig Start Date End Date Taking? Authorizing Provider  acetaminophen (TYLENOL) 500 MG tablet Take 1,000 mg by mouth every 6 (six) hours as needed for moderate pain.    [provider]  ARIPiprazole (ABILIFY) 5 MG tablet Take 5 mg by mouth daily.    [provider]  diazepam (VALIUM) 10 MG tablet Take 1 tablet (10 mg total) by mouth every 6 (six) hours as needed. Patient taking differently: Take 10 mg by mouth every 6 (six) hours as needed for anxiety. 07/07/20   Dione Booze, MD  escitalopram (LEXAPRO) 10 MG tablet Take 10 mg by mouth daily. 08/13/22   [provider]  HYDROcodone-acetaminophen (NORCO/VICODIN) 5-325 MG tablet Take 1 tablet by mouth every 6 (six) hours as needed. Patient not taking: Reported on 03/30/2022 01/11/22   Bethann Berkshire, MD  hydrOXYzine (VISTARIL) 25 MG capsule Take 25 mg by mouth 3 (three) times daily. 07/05/21   [provider]  insulin lispro (HUMALOG) 100 UNIT/ML injection Inject 1-10 Units into the skin 3 (three) times daily before meals. Patient taking differently: 1-10 Units 3 (three) times daily with meals. Based on sliding scale/carb intake-via pump 02/03/11   Elsaid, Hind I, MD  meloxicam (MOBIC) 7.5 MG tablet Take 1 tablet (  7.5 mg total) by mouth daily. 11/13/21   Vickki Hearing, MD  naloxone [facility]) nasal spray 4 mg/0.1 mL Place 1 spray into the nose once. 10/19/22   [provider]  phentermine 15 MG capsule Take 15 mg by mouth in the morning. 10/01/22   [provider]  pregabalin (LYRICA) 100 MG capsule Take 100 mg by mouth 3 (three) times daily. 11/26/22   [provider]      VITAL SIGNS:  Blood pressure 129/87, pulse 94, temperature 98.2 F (36.8 C), resp. rate 18, height 5\' 9"  (1.753 m), weight 83.9 kg,  SpO2 98%.  PHYSICAL EXAMINATION:  Physical Exam  GENERAL:  37 y.o.-year-old patient lying in the bed with no acute distress.  EYES: Pupils equal, round, reactive to light and accommodation. No scleral icterus. Extraocular muscles intact.  HEENT: Head atraumatic, normocephalic. Oropharynx and nasopharynx clear.  NECK:  Supple, no jugular venous distention. No thyroid enlargement, no tenderness.  LUNGS: Normal breath sounds bilaterally, no wheezing, rales,rhonchi or crepitation. No use of accessory muscles of respiration.  CARDIOVASCULAR: Regular rate and rhythm, S1, S2 normal. No murmurs, rubs, or gallops.  ABDOMEN: Soft, nondistended, nontender. Bowel sounds present. No organomegaly or mass.  EXTREMITIES: No pedal edema, cyanosis, or clubbing.  NEUROLOGIC: Cranial nerves II through XII are intact. Muscle strength 5/5 in all extremities. Sensation intact. Gait not checked.  PSYCHIATRIC: The patient is alert and oriented x 3.  Normal affect and good eye contact. SKIN: No obvious rash, lesion, or ulcer.   LABORATORY PANEL:   CBC Recent Labs  Lab 01/08/23 2205  WBC 7.6  HGB 16.4  HCT 47.0  PLT 248   ------------------------------------------------------------------------------------------------------------------  Chemistries  Recent Labs  Lab 01/08/23 2205  NA 138  K 3.8  CL 105  CO2 25  GLUCOSE 100*  BUN 10  CREATININE 0.96  CALCIUM 9.1  AST 15  ALT 23  ALKPHOS 84  BILITOT 0.8   ------------------------------------------------------------------------------------------------------------------  Cardiac Enzymes No results for input(s): "TROPONINI" in the last 168 hours. ------------------------------------------------------------------------------------------------------------------  RADIOLOGY:  No results found.    IMPRESSION AND PLAN:  Assessment and Plan: * Uncontrolled type 1 diabetes mellitus with hyperglycemia, with long-term current use of insulin (HCC) -  I ordered high resistant subtenons NovoLog with fingerstick blood glucose every 4 hours. - I think 20 units of subcutaneous Lovenox with repeat blood glucose in 1 hour. - We will continue his basal coverage with Semglee at the same dose for now.  Peripheral neuropathy - We will continue Lyrica.  Depression with suicidal ideation - Management per psychiatry with Lexapro and Abilify.  Bipolar 2 disorder (HCC) - He has been continued on Abilify and Lexapro.  Anxiety - She is on Valium and Vistaril.    Family Communication: The case was discussed with the patient with the request to talk to the family.  He agreed with the above-mentioned management. Primary team communication: The case was discussed with Dr. Renato Shin. Thank you very much for involving [place] in the care of your patient.  We will continue to follow the patient for his hyperglycemia.  Hannah Beat M.D on 01/14/2023 at 5:42 AM  [facility]   From 7 PM-7 AM, contact night-coverage [email or web address]  CC: Primary care physician; Roe Rutherford, NP

## 2023-01-13 NOTE — Group Note (Signed)
Date:  01/13/2023 Time:  10:26 AM  Group Topic/Focus:  Spirituality:   The focus of this group is to discuss how one's spirituality can aide in recovery.    Participation Level:  Active  Participation Quality:  Appropriate  Affect:  Appropriate  Cognitive:  Appropriate  Insight: Appropriate  Engagement in Group:  Engaged  Modes of Intervention:  Activity  Additional Comments:    Eric [name] 01/13/2023, 10:26 AM
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
## 2023-01-13 NOTE — BHH Suicide Risk Assessment (Signed)
BHH INPATIENT:  Family/Significant Other Suicide Prevention Education  Suicide Prevention Education:  Contact Attempts: Eric [name], mother, [phone],  has been identified by the patient as the family member/significant other with whom the patient will be residing, and identified as the person(s) who will aid the patient in the event of a mental health crisis.  With written consent from the patient, two attempts were made to provide suicide prevention education, prior to and/or following the patient's discharge.  We were unsuccessful in providing suicide prevention education.  A suicide education pamphlet was given to the patient to share with family/significant other.  Date and time of first attempt:01/13/23 at 1:55 pm Date and time of second attempt: second attempt needed.   [name] 01/13/2023, 3:04 PM

## 2023-01-13 NOTE — Plan of Care (Signed)
  Problem: Education: Goal: Knowledge of [facility] Education information/materials will improve Outcome: Progressing Goal: Emotional status will improve Outcome: Progressing Goal: Mental status will improve Outcome: Progressing Goal: Verbalization of understanding the information provided will improve Outcome: Progressing   
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
## 2023-01-13 NOTE — Group Note (Signed)
Date:  01/13/2023 Time:  3:19 PM  Group Topic/Focus:  Activity Group:  The focus of the group is to promote activity for the patients and encourage them to come outside in the courtyard and get some fresh air and some exercise.    Participation Level:  Active   Mary Sella Torina Ey 01/13/2023, 3:19 PM

## 2023-01-13 NOTE — Group Note (Deleted)
Date:  01/13/2023 Time:  3:00 PM  Group Topic/Focus:  Healthy Communication:   The focus of this group is to discuss communication, barriers to communication, as well as healthy ways to communicate with others. MoviesTherapy encourages emotional release Even those who often have trouble expressing their emotions might find themselves laughing or crying during a film. This release of emotions can have a cathartic effect and also make it easier for a person to become more comfortable in expressing their emotions.     Participation Level:  {BHH PARTICIPATION ZHYQM:57846}  Participation Quality:  {BHH PARTICIPATION QUALITY:22265}  Affect:  {BHH AFFECT:22266}  Cognitive:  {BHH COGNITIVE:22267}  Insight: {BHH Insight2:20797}  Engagement in Group:  {BHH ENGAGEMENT IN GROUP:22268}  Modes of Intervention:  {BHH MODES OF INTERVENTION:22269}  Additional Comments:  ***  [name] 01/13/2023, 3:00 PM

## 2023-01-13 NOTE — Group Note (Signed)
Date:  01/13/2023 Time:  9:46 PM  Group Topic/Focus:  Wrap-Up Group:   The focus of this group is to help patients review their daily goal of treatment and discuss progress on daily workbooks.    Participation Level:  Active  Participation Quality:  Appropriate and Attentive  Affect:  Appropriate  Cognitive:  Appropriate  Insight: Appropriate and Good  Engagement in Group:  Supportive  Modes of Intervention:  Support  Additional Comments:     Belva Crome 01/13/2023, 9:46 PM

## 2023-01-13 NOTE — Progress Notes (Signed)
Provider on call, contacted this writer stating that the hospitalist on call will be down to see patient. Lab called states on the way to draw A1C on patient.

## 2023-01-13 NOTE — H&P (Signed)
Psychiatric Admission Assessment Adult  Patient Identification: Eric [name] MRN:  [id number] Date of Evaluation:  01/13/2023 Chief Complaint:  Manic bipolar I disorder (HCC) [F31.10] Principal Diagnosis: Manic bipolar I disorder (HCC) Diagnosis:  Principal Problem:   Manic bipolar I disorder (HCC) Active Problems:   Low back pain   Chronic pain syndrome   Long term prescription opiate use   Benzodiazepine dependence (HCC)  History of Present Illness: 37 year old Caucasian male presenting to the ED under involuntary commitment (IVC) following escalating combative behavior toward his family and reported threats to harm himself. According to IVC documentation, the patient threatened to shoot himself and drive his car into a tree. He was brought to the ED by [facility] Department. At the time of evaluation, the patient denies suicidal ideation (SI), homicidal ideation (HI), or psychotic symptoms. The patient reports drinking "3-4 beers" occasionally and states he last consumed alcohol after an argument with his wife. He denies abusing prescription medications and reports taking Valium 10 mg BID and Lexapro 10 mg daily as prescribed. He denies a history of substance abuse or frequent alcohol use, though collateral from his wife suggests a pattern of alcohol abuse, misuse of prescription medications, and past aggressive behaviors.The patient states he was involved in a disagreement with his wife on the night of admission but denies violence during the altercation. He attributes his current hospitalization to a misunderstanding. Collateral from the wife highlights a history of outbursts, including throwing objects, threatening violence, and searching for a firearm approximately 1.5 weeks ago.The patient denies auditory or visual hallucinations, paranoia, or delusional thinking. He is calm and cooperative during the assessment but provides limited information about his behavior and  symptoms.Substance Use History: Denies frequent alcohol use or illicit substances, though wife reports a history of alcohol problems, including two DWIs. Collateral Concerns: Wife reports escalating aggression, alcohol abuse, and prescription medication misuse.The patient's presentation and collateral information suggest a need for further psychiatric stabilization and substance use evaluation. Associated Signs/Symptoms: Depression Symptoms:  anhedonia, insomnia, fatigue, hopelessness, anxiety, weight loss, decreased appetite, (Hypo) Manic Symptoms:  Labiality of Mood, Anxiety Symptoms:  Excessive Worry, Psychotic Symptoms:   denies PTSD Symptoms: Negative Total Time spent with patient: 2 hours  Past Psychiatric History: Anxiety, Depression   Is the patient at risk to self? No.  Has the patient been a risk to self in the past 6 months? Yes.    Has the patient been a risk to self within the distant past? Yes.    Is the patient a risk to others? No.  Has the patient been a risk to others in the past 6 months? No.  Has the patient been a risk to others within the distant past? No.   [place] Scale:  Flowsheet Row Admission (Current) from 01/12/2023 in [facility] ED from 01/08/2023 in [facility] Emergency Department at [facility] ED from 03/30/2022 in [facility] Emergency Department at [facility]  C-SSRS RISK CATEGORY No Risk No Risk No Risk        Prior Inpatient Therapy: No. If yes, describe none reported  Prior Outpatient Therapy: No. If yes, describe none reported   Alcohol Screening: 1. How often do you have a drink containing alcohol?: 2 to 3 times a week 2. How many drinks containing alcohol do you have on a typical day when you are drinking?: 1 or 2 3. How often do you have six or more drinks on one occasion?: Never AUDIT-C Score: 3  4. How often during the last year have you found that you were not able to stop drinking once you had  started?: Never 5. How often during the last year have you failed to do what was normally expected from you because of drinking?: Never 6. How often during the last year have you needed a first drink in the morning to get yourself going after a heavy drinking session?: Never 7. How often during the last year have you had a feeling of guilt of remorse after drinking?: Never 8. How often during the last year have you been unable to remember what happened the night before because you had been drinking?: Never 9. Have you or someone else been injured as a result of your drinking?: No 10. Has a relative or friend or a doctor or another health worker been concerned about your drinking or suggested you cut down?: No Alcohol Use Disorder Identification Test Final Score (AUDIT): 3 Substance Abuse History in the last 12 months:  Yes.   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  No, patient refused Counseled patient on smoking cessation:  Yes Reason Tobacco Screening Not Completed: No value filed.       Social History:  Social History   Substance and  Sexual Activity  Alcohol Use Not Currently     Social History   Substance and Sexual Activity  Drug Use Not Currently    Additional Social History: Marital status: Married Number of Years Married: 3 What types of issues is patient dealing with in the relationship?: The patient stated that other than the agruement that led to him being here there has been no issues. Does patient have children?: Yes How many children?: 5 How is patient's relationship with their children?: The patient stated that the relationship with his children are getting better. The patient stated that his wife is currently pregnant with their 6th child.                         Allergies:  No Known Allergies Lab Results:  Results for orders placed or performed during the hospital encounter of 01/12/23 (from the past 48 hour(s))  Glucose, capillary     Status: Abnormal   Collection Time: 01/12/23  4:33 PM  Result Value Ref Range   Glucose-Capillary 102 (H) 70 - 99 mg/dL    Comment: Glucose reference range applies only to samples taken after fasting for at least 8 hours.  Glucose, capillary     Status: Abnormal   Collection Time:  01/12/23  7:59 PM  Result Value Ref Range   Glucose-Capillary 285 (H) 70 - 99 mg/dL    Comment: Glucose reference range applies only to samples taken after fasting for at least 8 hours.  Glucose, capillary     Status: Abnormal   Collection Time: 01/13/23  6:58 AM  Result Value Ref Range   Glucose-Capillary 265 (H) 70 - 99 mg/dL    Comment: Glucose reference range applies only to samples taken after fasting for at least 8 hours.  Lipid panel     Status: Abnormal   Collection Time: 01/13/23  7:39 AM  Result Value Ref Range   Cholesterol 193 0 - 200 mg/dL   Triglycerides 914 (H) <150 mg/dL   HDL 30 (L) >78 mg/dL   Total CHOL/HDL Ratio 6.4 RATIO   VLDL 79 (H) 0 - 40 mg/dL   LDL Cholesterol 84 0 - 99 mg/dL    Comment:        Total Cholesterol/HDL:CHD Risk Coronary Heart  Disease Risk Table                     Men   Women  1/2 Average Risk   3.4   3.3  Average Risk       5.0   4.4  2 X Average Risk   9.6   7.1  3 X Average Risk  23.4   11.0        Use the calculated Patient Ratio above and the CHD Risk Table to determine the patient's CHD Risk.        ATP III CLASSIFICATION (LDL):  <100     mg/dL   Optimal  295-621  mg/dL   Near or Above                    Optimal  130-159  mg/dL   Borderline  308-657  mg/dL   High  >846     mg/dL   Very High Performed at [facility], [address] [place], [place] [address]   Glucose, capillary     Status: Abnormal   Collection Time: 01/13/23  8:41 AM  Result Value Ref Range   Glucose-Capillary 377 (H) 70 - 99 mg/dL    Comment: Glucose reference range applies only to samples taken after fasting for at least 8 hours.  Glucose, capillary     Status: Abnormal   Collection Time: 01/13/23 11:43 AM  Result Value Ref Range   Glucose-Capillary 299 (H) 70 - 99 mg/dL    Comment: Glucose reference range applies only to samples taken after fasting for at least 8 hours.    Blood Alcohol level:  Lab Results  Component Value Date   ETH 72 (H) 01/08/2023   ETH 206 (H) 09/21/2021    Metabolic Disorder Labs:  Lab Results  Component Value Date   HGBA1C 10.4 (H) 08/26/2021   MPG 251.78 08/26/2021   MPG 188.64 03/30/2017   No results found for: "PROLACTIN" Lab Results  Component Value Date   CHOL 193 01/13/2023   TRIG 396 (H) 01/13/2023   HDL 30 (L) 01/13/2023   CHOLHDL 6.4 01/13/2023   VLDL 79 (H) 01/13/2023   LDLCALC 84 01/13/2023    Current Medications: Current Facility-Administered Medications  Medication Dose Route Frequency Provider Last Rate Last Admin   alum & mag hydroxide-simeth (MAALOX/MYLANTA) 200-200-20 MG/5ML suspension 30 mL  30 mL Oral Q4H PRN Sindy Guadeloupe, NP  ARIPiprazole (ABILIFY) tablet 5 mg  5 mg Oral Daily Myriam Forehand, NP   5 mg at 01/13/23 0847   [START ON  01/14/2023] clonazePAM (KLONOPIN) tablet 0.5 mg  0.5 mg Oral BID Myriam Forehand, NP       Melene Muller ON 01/15/2023] clonazePAM Scarlette Calico) tablet 0.5 mg  0.5 mg Oral Once Myriam Forehand, NP       clonazePAM Scarlette Calico) tablet 1 mg  1 mg Oral BID Myriam Forehand, NP       DULoxetine (CYMBALTA) DR capsule 20 mg  20 mg Oral Daily Myriam Forehand, NP       folic acid (FOLVITE) tablet 1 mg  1 mg Oral Daily Myriam Forehand, NP   1 mg at 01/13/23 0846   HYDROcodone-acetaminophen (NORCO/VICODIN) 5-325 MG per tablet 1 tablet  1 tablet Oral Q6H PRN Myriam Forehand, NP       hydrOXYzine (ATARAX) tablet 25 mg  25 mg Oral TID PRN Jearld Lesch, NP   25 mg at 01/12/23 2149   insulin aspart (novoLOG) injection 4 Units  4 Units Subcutaneous TID WC Myriam Forehand, NP   4 Units at 01/13/23 1151   insulin glargine-yfgn (SEMGLEE) injection 35 Units  35 Units Subcutaneous Daily Myriam Forehand, NP   35 Units at 01/13/23 1309   LORazepam (ATIVAN) tablet 1-4 mg  1-4 mg Oral Q1H PRN Myriam Forehand, NP       Or   LORazepam (ATIVAN) injection 1-4 mg  1-4 mg Intravenous Q1H PRN Myriam Forehand, NP       LORazepam (ATIVAN) tablet 0-4 mg  0-4 mg Oral Q6H Myriam Forehand, NP   2 mg at 01/13/23 0500   Followed by   Melene Muller ON 01/14/2023] LORazepam (ATIVAN) tablet 0-4 mg  0-4 mg Oral Q12H Myriam Forehand, NP       magnesium hydroxide (MILK OF MAGNESIA) suspension 30 mL  30 mL Oral Daily PRN Sindy Guadeloupe, NP       meloxicam (MOBIC) tablet 7.5 mg  7.5 mg Oral Daily Myriam Forehand, NP   7.5 mg at 01/13/23 0981   multivitamin with minerals tablet 1 tablet  1 tablet Oral Daily Myriam Forehand, NP   1 tablet at 01/13/23 0846   OLANZapine (ZYPREXA) injection 10 mg  10 mg Intramuscular TID PRN Sindy Guadeloupe, NP       OLANZapine (ZYPREXA) injection 5 mg  5 mg Intramuscular TID PRN Sindy Guadeloupe, NP       OLANZapine zydis (ZYPREXA) disintegrating tablet 5 mg  5 mg Oral TID PRN Sindy Guadeloupe, NP       pregabalin (LYRICA) capsule 100 mg  100 mg Oral BID Myriam Forehand, NP   100 mg at 01/13/23 1914   thiamine (VITAMIN B1) tablet 100 mg  100 mg Oral Daily Myriam Forehand, NP   100 mg at 01/13/23 7829   Or   thiamine (VITAMIN B1) injection 100 mg  100 mg Intravenous Daily Myriam Forehand, NP       traZODone (DESYREL) tablet 150 mg  150 mg Oral QHS Myriam Forehand, NP       PTA Medications: Medications Prior to Admission  Medication Sig Dispense Refill Last Dose   acetaminophen (TYLENOL) 500 MG tablet Take 1,000 mg by mouth every 6 (six) hours as needed for moderate pain.      ARIPiprazole (ABILIFY) 5 MG tablet Take 5 mg by mouth  daily.      diazepam (VALIUM) 10 MG tablet Take 1 tablet (10 mg total) by mouth every 6 (six) hours as needed. (Patient taking differently: Take 10 mg by mouth every 6 (six) hours as needed for anxiety.) 30 tablet 0    escitalopram (LEXAPRO) 10 MG tablet Take 10 mg by mouth daily.      HYDROcodone-acetaminophen (NORCO/VICODIN) 5-325 MG tablet Take 1 tablet by mouth every 6 (six) hours as needed. (Patient not taking: Reported on 03/30/2022) 15 tablet 0    hydrOXYzine (VISTARIL) 25 MG capsule Take 25 mg by mouth 3 (three) times daily.      insulin lispro (HUMALOG) 100 UNIT/ML injection Inject 1-10 Units into the skin 3 (three) times daily before meals. (Patient taking differently: 1-10 Units 3 (three) times daily with meals. Based on sliding scale/carb intake-via pump) 10 mL 12    meloxicam (MOBIC) 7.5 MG tablet Take 1 tablet (7.5 mg total) by mouth daily. 60 tablet 5    naloxone (NARCAN) nasal spray 4 mg/0.1 mL Place 1 spray into the nose once.      phentermine 15 MG capsule Take 15 mg by mouth in the morning.      pregabalin (LYRICA) 100 MG capsule Take 100 mg by mouth 3 (three) times daily.       Musculoskeletal: Strength & Muscle Tone: within normal limits Gait & Station: normal Patient leans: N/A            Psychiatric Specialty Exam:  Presentation  General Appearance:  Fairly Groomed; Appropriate for Environment  Eye  Contact: Good  Speech: Clear and Coherent; Normal Rate  Speech Volume: Decreased  Handedness: Left   Mood and Affect  Mood: Anxious; Irritable  Affect: Depressed   Thought Process  Thought Processes: Coherent  Duration of Psychotic Symptoms: depressive symptoms 30 days Past Diagnosis of Schizophrenia or Psychoactive disorder: No  Descriptions of Associations:Intact  Orientation:Full (Time, Place and Person) (and situation)  Thought Content:WDL  Hallucinations:Hallucinations: None  Ideas of Reference:None  Suicidal Thoughts:Suicidal Thoughts: -- (denies)  Homicidal Thoughts:Homicidal Thoughts: No (denies)   Sensorium  Memory: Immediate Good; Remote Good  Judgment: Fair  Insight: Fair   Art therapist  Concentration: Good  Attention Span: Fair  Recall: Good  Fund of Knowledge: Good  Language: Good   Psychomotor Activity  Psychomotor Activity: Psychomotor Activity: Normal   Assets  Assets: Communication Skills; Social Support; Housing   Sleep  Sleep: Sleep: Good Number of Hours of Sleep: 7    Physical Exam: Physical Exam Vitals and nursing note reviewed.  Constitutional:      Appearance: Normal appearance.  HENT:     Head: Normocephalic and atraumatic.     Nose: Nose normal.  Pulmonary:     Effort: Pulmonary effort is normal.  Musculoskeletal:        General: Deformity present. Normal range of motion.     Cervical back: Normal range of motion.  Neurological:     General: No focal deficit present.     Mental Status: He is alert and oriented to person, place, and time. Mental status is at baseline.  Psychiatric:        Attention and Perception: Attention and perception normal.        Mood and Affect: Mood is depressed. Affect is blunt.        Speech: Speech normal.        Behavior: Behavior normal. Behavior is cooperative.        Thought Content: Thought  content normal.        Cognition and Memory: Cognition  and memory normal.        Judgment: Judgment normal.    Review of Systems  Psychiatric/Behavioral:  Positive for depression and substance abuse. The patient has insomnia.   All other systems reviewed and are negative.  Blood pressure 110/74, pulse 75, temperature (!) 97.5 F (36.4 C), resp. rate 18, height 5\' 9"  (1.753 m), weight 83.9 kg, SpO2 96%. Body mass index is 27.32 kg/m.  Treatment Plan Summary: Daily contact with patient to assess and evaluate symptoms and progress in treatment and Medication management Initiate CIWA protocol with Ativan for withdrawal monitoring and symptom management. Begin Klonopin taper for long-term management of anxiety and alcohol withdrawal symptoms. Abilify 5 mg daily for mood stabilization. Trazodone 200 mg nightly for insomnia. Lyrica 100 mg BID for anxiety and potential neuropathic symptom Diabetic consult ordered for optimization of care. Begin Semglee 35 units daily for glycemic control. CBG TID and at bedtime to monitor blood glucose levels. Order Lipid panel to assess cardiovascular risk factors. Outpatient psychiatric follow-up to reassess mood and medication adherence. Observation Level/Precautions:  Continuous Observation Detox 15 minute checks Seizure  Laboratory:   lipids  Psychotherapy:    Medications:    Consultations:    Discharge Concerns:    Estimated LOS:  Other:     Physician Treatment Plan for Primary Diagnosis: Manic bipolar I disorder (HCC) Long Term Goal(s): Improvement in symptoms so as ready for discharge  Short Term Goals: Ability to identify changes in lifestyle to reduce recurrence of condition will improve, Ability to verbalize feelings will improve, Ability to disclose and discuss suicidal ideas, Ability to demonstrate self-control will improve, Ability to identify and develop effective coping behaviors will improve, Ability to maintain clinical measurements within normal limits will improve, Compliance with  prescribed medications will improve, and Ability to identify triggers associated with substance abuse/mental health issues will improve  Physician Treatment Plan for Secondary Diagnosis: Principal Problem:   Manic bipolar I disorder (HCC) Active Problems:   Low back pain   Chronic pain syndrome   Long term prescription opiate use   Benzodiazepine dependence (HCC)  Long Term Goal(s): Improvement in symptoms so as ready for discharge  Short Term Goals: Ability to identify changes in lifestyle to reduce recurrence of condition will improve, Ability to verbalize feelings will improve, Ability to disclose and discuss suicidal ideas, Ability to demonstrate self-control will improve, Ability to identify and develop effective coping behaviors will improve, Ability to maintain clinical measurements within normal limits will improve, Compliance with prescribed medications will improve, and Ability to identify triggers associated with substance abuse/mental health issues will improve  I certify that inpatient services furnished can reasonably be expected to improve the patient's condition.    Myriam Forehand, NP 12/1/20243:24 PM

## 2023-01-13 NOTE — Progress Notes (Signed)
Hospitalist in to see patient. New orders received and completed.

## 2023-01-13 NOTE — Progress Notes (Signed)
Notified provider on call patient cbg [phone]. Patient asymptomatic. Awaiting orders.

## 2023-01-13 NOTE — Inpatient Diabetes Management (Signed)
Inpatient Diabetes Program Recommendations  AACE/ADA: New Consensus Statement on Inpatient Glycemic Control (2015)  Target Ranges:  Prepandial:   less than 140 mg/dL      Peak postprandial:   less than 180 mg/dL (1-2 hours)      Critically ill patients:  140 - 180 mg/dL   Lab Results  Component Value Date   GLUCAP 377 (H) 01/13/2023   HGBA1C 10.4 (H) 08/26/2021    Review of Glycemic Control  Latest Reference Range & Units 01/12/23 16:33 01/12/23 19:59 01/13/23 06:58 01/13/23 08:41  Glucose-Capillary 70 - 99 mg/dL 161 (H) 096 (H) 045 (H) 377 (H)  (H): Data is abnormally high Diabetes history: DM1 Outpatient Diabetes medications: OmniPod insulin pump with Dexcom G6 CGM Current orders for Inpatient glycemic control: Novolog 0-20 units TID & HS   Inpatient Diabetes Program Recommendations:     Insulin: Consider ordering Novolog 4 units TID with meals for meal coverage if patient eats at least 50% of meals and Semglee 35 units every day (to start now).  Secure chat sent to RN.   Thanks, Lujean Rave, MSN, RNC-OB Diabetes Coordinator [phone] (8a-5p)

## 2023-01-13 NOTE — Progress Notes (Signed)
D: Patient alert and oriented, able to make needs known. Denies SI/HI, AVH at present. Reports pain 5/10 in lower back, see MAR for details of how the pain is being addressed. Patient goal today "getting out of my shell and not being so shy." Rates depression 6/10, hopelessness 0/10, and anxiety 4/10. Patient reports energy level as normal. He reports he slept good last night.   A: Scheduled medications administered to patient per MD order. Support and encouragement provided. Routine safety checks conducted every fifteen minutes. Patient informed to notify staff with problems or concerns. Frequent verbal contact made.   R: No adverse drug reactions noted. Patient contracts for safety at this time. Patient is compliant with medications and treatment plan. Patient receptive, calm and cooperative. Patient interacts with others appropriately on unit at present. Patient remains safe at present.

## 2023-01-13 NOTE — Plan of Care (Signed)
  Problem: Education: Goal: Emotional status will improve Outcome: Progressing   Problem: Education: Goal: Mental status will improve Outcome: Progressing   Problem: Education: Goal: Verbalization of understanding the information provided will improve Outcome: Progressing   Problem: Education: Goal: Knowledge of [facility] Education information/materials will improve Outcome: Progressing

## 2023-01-14 DIAGNOSIS — G629 Polyneuropathy, unspecified: Secondary | ICD-10-CM

## 2023-01-14 DIAGNOSIS — F3181 Bipolar II disorder: Secondary | ICD-10-CM

## 2023-01-14 DIAGNOSIS — F419 Anxiety disorder, unspecified: Secondary | ICD-10-CM | POA: Insufficient documentation

## 2023-01-14 DIAGNOSIS — R45851 Suicidal ideations: Secondary | ICD-10-CM

## 2023-01-14 LAB — GLUCOSE, CAPILLARY
Glucose-Capillary: 372 mg/dL — ABNORMAL HIGH (ref 70–99)
Glucose-Capillary: 126 mg/dL — ABNORMAL HIGH (ref 70–99)
Glucose-Capillary: 38 mg/dL — CL (ref 70–99)
Glucose-Capillary: 63 mg/dL — ABNORMAL LOW (ref 70–99)
Glucose-Capillary: 260 mg/dL — ABNORMAL HIGH (ref 70–99)
Glucose-Capillary: 346 mg/dL — ABNORMAL HIGH (ref 70–99)
Glucose-Capillary: 257 mg/dL — ABNORMAL HIGH (ref 70–99)
Glucose-Capillary: 343 mg/dL — ABNORMAL HIGH (ref 70–99)

## 2023-01-14 LAB — HEMOGLOBIN A1C
Hgb A1c MFr Bld: 6.7 % — ABNORMAL HIGH (ref 4.8–5.6)
Mean Plasma Glucose: 145.59 mg/dL

## 2023-01-14 MED ORDER — LAMOTRIGINE 25 MG PO TABS
25.0000 mg | ORAL_TABLET | Freq: Every day | ORAL | Status: DC
  Administered 2023-01-14 – 2023-01-15 (×2): 25 mg via ORAL
  Filled 2023-01-14 (×2): qty 1

## 2023-01-14 MED ORDER — INSULIN ASPART 100 UNIT/ML IJ SOLN
6.0000 [IU] | Freq: Three times a day (TID) | INTRAMUSCULAR | Status: DC
  Administered 2023-01-14 – 2023-01-15 (×2): 6 [IU] via SUBCUTANEOUS
  Filled 2023-01-14 (×2): qty 1

## 2023-01-14 MED ORDER — INSULIN ASPART 100 UNIT/ML IJ SOLN
0.0000 [IU] | Freq: Three times a day (TID) | INTRAMUSCULAR | Status: DC
  Administered 2023-01-14: 15 [IU] via SUBCUTANEOUS
  Administered 2023-01-14: 11 [IU] via SUBCUTANEOUS
  Filled 2023-01-14 (×2): qty 1

## 2023-01-14 MED ORDER — LAMOTRIGINE 100 MG PO TABS: 100.0000 mg | ORAL_TABLET | Freq: Every day | ORAL | Status: DC

## 2023-01-14 MED ORDER — GLUCOSE 40 % PO GEL: 1.0000 | ORAL | Status: DC | PRN

## 2023-01-14 MED ORDER — INSULIN ASPART 100 UNIT/ML IJ SOLN
0.0000 [IU] | Freq: Every day | INTRAMUSCULAR | Status: DC
  Administered 2023-01-14: 4 [IU] via SUBCUTANEOUS
  Filled 2023-01-14: qty 1

## 2023-01-14 MED ORDER — INSULIN ASPART 100 UNIT/ML IJ SOLN
0.0000 [IU] | Freq: Three times a day (TID) | INTRAMUSCULAR | Status: DC
  Administered 2023-01-14: 8 [IU] via SUBCUTANEOUS
  Administered 2023-01-15: 10 [IU] via SUBCUTANEOUS
  Administered 2023-01-15: 15 [IU] via SUBCUTANEOUS
  Filled 2023-01-14 (×3): qty 1

## 2023-01-14 MED ORDER — NICOTINE POLACRILEX 2 MG MT GUM
2.0000 mg | CHEWING_GUM | OROMUCOSAL | Status: DC | PRN
  Administered 2023-01-14 – 2023-01-15 (×3): 2 mg via ORAL
  Filled 2023-01-14 (×2): qty 1

## 2023-01-14 NOTE — Inpatient Diabetes Management (Signed)
Inpatient Diabetes Program Recommendations  AACE/ADA: New Consensus Statement on Inpatient Glycemic Control (2015)  Target Ranges:  Prepandial:   less than 140 mg/dL      Peak postprandial:   less than 180 mg/dL (1-2 hours)      Critically ill patients:  140 - 180 mg/dL    Latest Reference Range & Units 01/12/23 08:46 01/12/23 11:29 01/12/23 16:33 01/12/23 19:59  Glucose-Capillary 70 - 99 mg/dL 528 (H)  6 units Novolog  35 units Semglee 200 (H)  7 units Novolog 102 (H) 285 (H)  3 units Novolog  (H): Data is abnormally high  Latest Reference Range & Units 01/13/23 06:58 01/13/23 08:41 01/13/23 11:43 01/13/23 16:44 01/13/23 21:11 01/13/23 23:27  Glucose-Capillary 70 - 99 mg/dL 413 (H) 244 (H)  20 units Novolog 299 (H)  4 units Novolog  35 units Semglee 183 (H)  4 units Novolog 406 (H)  20 units Novolog @2207  372 (H)  20 units Novolog @2328   (H): Data is abnormally high  Latest Reference Range & Units 01/14/23 03:30 01/14/23 03:53 01/14/23 04:15  Glucose-Capillary 70 - 99 mg/dL 38 (LL) 63 (L) 010 (H)      To ED with IVC for combative and threatening behavior with SI    History: Type 1 diabetes, Anxiety, Depression, and Bipolar    Home DM Meds: Insulin Pump (Omni Pod) + Dexcom G6 CGM   Current Orders: Novolog Resistant Correction Scale/ SSI (0-20 units) Q4 hours                           Semglee 35 units Daily    Note Severe Hypoglycemia at 3am likely due to back to back doses of Novolog pt received last PM (20 units at 10pm and 20 units at 11pm)  This was too much Novolog given too close together  MD- Please consider:  1. Change/Reduce the Novolog SSI to the 0-15 unit (Moderate scale) TID AC + HS  2. Start back Novolog Meal Coverage (pt needs coverage for carbohydrates as he has Type 1 diabetes and does not make endogenous insulin): Novolog 6 units TID with meals HOLD if pt NPO HOLD if pt eats <50% meals     ENDO: Evlyn Courier, NP with [facility] Last Seen 12/20/2022 OmniPod 5 Insulin Pump Basal:  0000-12:00: 2.45 un/hr 12:00-0000: 2.45 un/hr Total basal = 58.8 units   I:C 1:12 ISF(35) Target BG(120-150)    --Will follow patient during hospitalization--  Ambrose Finland RN, MSN, CDCES Diabetes Coordinator Inpatient Glycemic Control Team Team Pager: [phone] (8a-5p)

## 2023-01-14 NOTE — Group Note (Signed)
Date:  01/14/2023 Time:  10:29 AM  Group Topic/Focus:  Dimensions of Wellness:   The focus of this group is to introduce the topic of wellness and discuss the role each dimension of wellness plays in total health. Goals Group:   The focus of this group is to help patients establish daily goals to achieve during treatment and discuss how the patient can incorporate goal setting into their daily lives to aide in recovery. Self Care:   The focus of this group is to help patients understand the importance of self-care in order to improve or restore emotional, physical, spiritual, interpersonal, and financial health.    Participation Level:  Did Not Attend   Rosaura Carpenter 01/14/2023, 10:29 AM

## 2023-01-14 NOTE — Assessment & Plan Note (Signed)
-   We will continue Lyrica. 

## 2023-01-14 NOTE — Progress Notes (Signed)
[facility] MD Progress Note  01/14/2023 6:56 PM Eric [name]  MRN:  [id number] Subjective: 37 year old Caucasian male with chronic pain syndrome, opioid dependence, and uncontrolled diabetes, currently managed by the medical hospitalist team. The patient reports feeling depressed and expresses dissatisfaction with his condition. His wife requested to speak with the provider, but the patient did not authorize communication with her.No signs of acute distress, agitation, or withdrawal complications observed. Myriam Forehand, NP Principal Problem: Uncontrolled type 1 diabetes mellitus with hyperglycemia, with long-term current use of insulin (HCC) Diagnosis: Principal Problem:   Uncontrolled type 1 diabetes mellitus with hyperglycemia, with long-term current use of insulin (HCC) Active Problems:   Low back pain   Manic bipolar I disorder (HCC)   Chronic pain syndrome   Long term prescription opiate use   Benzodiazepine dependence (HCC)   Bipolar 2 disorder (HCC)   Depression with suicidal ideation   Anxiety   Peripheral neuropathy  Total Time spent with patient: 1 hour  Past Psychiatric History: Bipolar Anxiety  Past Medical History:  Past Medical History:  Diagnosis Date   Anxiety    Bipolar 2 disorder, major depressive episode (HCC)    Depression    Diabetes mellitus    Hypertension     Past Surgical History:  Procedure Laterality Date   AMPUTATION Right    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   FRACTURE SURGERY     reconstructive left knee surgery   KNEE SURGERY     Family History:  Family History  Problem Relation Age of Onset   Hypertension Mother    Hypertension Father    Family Psychiatric  History: none reported Social History:  Social History   Substance and Sexual Activity  Alcohol Use Not Currently     Social History   Substance and Sexual Activity  Drug Use Not Currently    Social History   Socioeconomic History   Marital status: Married    Spouse name: Not  on file   Number of children: Not on file   Years of education: Not on file   Highest education level: Not on file  Occupational History   Not on file  Tobacco Use   Smoking status: Never   Smokeless tobacco: Current    Types: Snuff  Vaping Use   Vaping status: Never Used  Substance and Sexual Activity   Alcohol use: Not Currently   Drug use: Not Currently   Sexual activity: Not on file  Other Topics Concern   Not on file  Social History Narrative   Not on file   Social Determinants of Health   Financial Resource Strain: Patient Declined (04/05/2022)   Received from [facility], [facility]   Overall Financial Resource Strain (CARDIA)    Difficulty of Paying Living Expenses: Patient declined  Recent Concern: Financial Resource Strain - Medium Risk (02/28/2022)   Received from [facility], [facility]   Overall Financial Resource Strain (CARDIA)    Difficulty of Paying Living Expenses: Somewhat hard  Food Insecurity: No Food Insecurity (01/12/2023)   Hunger Vital Sign    Worried About Running Out of Food in the Last Year: Never true    Ran Out of Food in the Last Year: Never true  Transportation Needs: No Transportation Needs (01/12/2023)   PRAPARE - Administrator, Civil Service (Medical): No    Lack of Transportation (Non-Medical): No  Physical Activity: Sufficiently Active (12/11/2021)   Received from [facility],  [facility]   Exercise Vital Sign    Days of Exercise per Week: 2 days    Minutes of Exercise per Session: 120 min  Stress: Patient Declined (12/11/2021)   Received from [facility], [facility] of Occupational Health - Occupational Stress Questionnaire    Feeling of Stress : Patient declined  Social Connections: Unknown (06/13/2021)   Received from [facility]   Social Network    Social Network: Not on file   Additional Social History:                          Sleep: Fair  Appetite:  Fair  Current Medications: Current Facility-Administered Medications  Medication Dose Route Frequency Provider Last Rate Last Admin   alum & mag hydroxide-simeth (MAALOX/MYLANTA) 200-200-20 MG/5ML suspension 30 mL  30 mL Oral Q4H PRN Sindy Guadeloupe, NP       ARIPiprazole (ABILIFY) tablet 5 mg  5 mg Oral Daily Myriam Forehand, NP   5 mg at 01/14/23 0831   clonazePAM (KLONOPIN) tablet 0.5 mg  0.5 mg Oral BID Myriam Forehand, NP   0.5 mg at 01/14/23 1651   [START ON 01/15/2023] clonazePAM (KLONOPIN) tablet 0.5 mg  0.5 mg Oral Once Myriam Forehand, NP       dextrose (GLUTOSE) oral gel 40%  1 Tube Oral PRN Sunnie Nielsen, DO       DULoxetine (CYMBALTA) DR capsule 20 mg  20 mg Oral Daily Myriam Forehand, NP   20 mg at 01/14/23 0831   folic acid (FOLVITE) tablet 1 mg  1 mg Oral Daily Myriam Forehand, NP   1 mg at 01/14/23 0831   HYDROcodone-acetaminophen (NORCO/VICODIN) 5-325 MG per tablet 1 tablet  1 tablet Oral Q6H PRN Myriam Forehand, NP   1 tablet at 01/13/23 2115   hydrOXYzine (ATARAX) tablet 25 mg  25 mg Oral TID PRN Jearld Lesch, NP   25 mg at 01/14/23 1225   insulin aspart (novoLOG) injection 0-15 Units  0-15 Units Subcutaneous TID WC Sunnie Nielsen, DO   8 Units at 01/14/23 1641   insulin aspart (novoLOG) injection 0-5 Units  0-5 Units Subcutaneous QHS Sunnie Nielsen, DO       insulin aspart (novoLOG) injection 6 Units  6 Units Subcutaneous TID WC Sunnie Nielsen, DO   6 Units at 01/14/23 1639   insulin glargine-yfgn (SEMGLEE) injection 35 Units  35 Units Subcutaneous Daily Myriam Forehand, NP   35 Units at 01/14/23 0830   lamoTRIgine (LAMICTAL) tablet 100 mg  100 mg Oral Daily Myriam Forehand, NP       LORazepam (ATIVAN) tablet 0-4 mg  0-4 mg Oral Q12H Myriam Forehand, NP       magnesium hydroxide (MILK OF MAGNESIA) suspension 30 mL  30 mL Oral Daily PRN Sindy Guadeloupe, NP       meloxicam (MOBIC) tablet 7.5 mg  7.5 mg Oral Daily Myriam Forehand, NP   7.5 mg at  01/14/23 1610   multivitamin with minerals tablet 1 tablet  1 tablet Oral Daily Myriam Forehand, NP   1 tablet at 01/14/23 0832   OLANZapine (ZYPREXA) injection 10 mg  10 mg Intramuscular TID PRN Sindy Guadeloupe, NP       OLANZapine (ZYPREXA) injection 5 mg  5 mg Intramuscular TID PRN Sindy Guadeloupe, NP       OLANZapine zydis (ZYPREXA) disintegrating tablet 5 mg  5 mg Oral TID PRN Sindy Guadeloupe, NP       pregabalin (LYRICA) capsule 100 mg  100 mg Oral BID Myriam Forehand, NP   100 mg at 01/14/23 1652   thiamine (VITAMIN B1) tablet 100 mg  100 mg Oral Daily Myriam Forehand, NP   100 mg at 01/14/23 1478   Or   thiamine (VITAMIN B1) injection 100 mg  100 mg Intravenous Daily Myriam Forehand, NP       traZODone (DESYREL) tablet 150 mg  150 mg Oral QHS Myriam Forehand, NP   150 mg at 01/13/23 2114    Lab Results:  Results for orders placed or performed during the hospital encounter of 01/12/23 (from the past 48 hour(s))  Glucose, capillary     Status: Abnormal   Collection Time: 01/12/23  7:59 PM  Result Value Ref Range   Glucose-Capillary 285 (H) 70 - 99 mg/dL    Comment: Glucose reference range applies only to samples taken after fasting for at least 8 hours.  Glucose, capillary     Status: Abnormal   Collection Time: 01/13/23  6:58 AM  Result Value Ref Range   Glucose-Capillary 265 (H) 70 - 99 mg/dL    Comment: Glucose reference range applies only to samples taken after fasting for at least 8 hours.  Hemoglobin A1c     Status: Abnormal   Collection Time: 01/13/23  7:36 AM  Result Value Ref Range   Hgb A1c MFr Bld 6.7 (H) 4.8 - 5.6 %    Comment: (NOTE) Pre diabetes:          5.7%-6.4%  Diabetes:              >6.4%  Glycemic control for   <7.0% adults with diabetes    Mean Plasma Glucose 145.59 mg/dL    Comment: Performed at [facility], [address] [place], [place] [address]  Lipid panel     Status: Abnormal   Collection Time: 01/13/23  7:39 AM  Result Value Ref Range    Cholesterol 193 0 - 200 mg/dL   Triglycerides 130 (H) <150 mg/dL   HDL 30 (L) >86 mg/dL   Total CHOL/HDL Ratio 6.4 RATIO   VLDL 79 (H) 0 - 40 mg/dL   LDL Cholesterol 84 0 - 99 mg/dL    Comment:        Total Cholesterol/HDL:CHD Risk Coronary Heart Disease Risk Table                     Men   Women  1/2 Average Risk   3.4   3.3  Average Risk       5.0   4.4  2 X Average Risk   9.6   7.1  3 X Average Risk  23.4   11.0        Use the calculated Patient Ratio above and the CHD Risk Table to determine the patient's CHD Risk.        ATP III CLASSIFICATION (LDL):  <100     mg/dL   Optimal  578-469  mg/dL   Near or Above                    Optimal  130-159  mg/dL   Borderline  629-528  mg/dL   High  >413     mg/dL   Very High Performed at [facility], [address] [place], [place] [address]  Glucose, capillary     Status: Abnormal   Collection Time: 01/13/23  8:41 AM  Result Value Ref Range   Glucose-Capillary 377 (H) 70 - 99 mg/dL    Comment: Glucose reference range applies only to samples taken after fasting for at least 8 hours.  Glucose, capillary     Status: Abnormal   Collection Time: 01/13/23 11:43 AM  Result Value Ref Range   Glucose-Capillary 299 (H) 70 - 99 mg/dL    Comment: Glucose reference range applies only to samples taken after fasting for at least 8 hours.  Glucose, capillary     Status: Abnormal   Collection Time: 01/13/23  4:44 PM  Result Value Ref Range   Glucose-Capillary 183 (H) 70 - 99 mg/dL    Comment: Glucose reference range applies only to samples taken after fasting for at least 8 hours.  Glucose, capillary     Status: Abnormal   Collection Time: 01/13/23  9:11 PM  Result Value Ref Range   Glucose-Capillary 406 (H) 70 - 99 mg/dL    Comment: Glucose reference range applies only to samples taken after fasting for at least 8 hours.  Glucose, capillary     Status: Abnormal   Collection Time: 01/13/23 11:27 PM  Result Value Ref Range    Glucose-Capillary 372 (H) 70 - 99 mg/dL    Comment: Glucose reference range applies only to samples taken after fasting for at least 8 hours.  Glucose, capillary     Status: Abnormal   Collection Time: 01/14/23  3:30 AM  Result Value Ref Range   Glucose-Capillary 38 (LL) 70 - 99 mg/dL    Comment: Glucose reference range applies only to samples taken after fasting for at least 8 hours.   Comment 1 Document in Chart   Glucose, capillary     Status: Abnormal   Collection Time: 01/14/23  3:53 AM  Result Value Ref Range   Glucose-Capillary 63 (L) 70 - 99 mg/dL    Comment: Glucose reference range applies only to samples taken after fasting for at least 8 hours.  Glucose, capillary     Status: Abnormal   Collection Time: 01/14/23  4:15 AM  Result Value Ref Range   Glucose-Capillary 126 (H) 70 - 99 mg/dL    Comment: Glucose reference range applies only to samples taken after fasting for at least 8 hours.  Glucose, capillary     Status: Abnormal   Collection Time: 01/14/23  7:59 AM  Result Value Ref Range   Glucose-Capillary 260 (H) 70 - 99 mg/dL    Comment: Glucose reference range applies only to samples taken after fasting for at least 8 hours.  Glucose, capillary     Status: Abnormal   Collection Time: 01/14/23 11:54 AM  Result Value Ref Range   Glucose-Capillary 346 (H) 70 - 99 mg/dL    Comment: Glucose reference range applies only to samples taken after fasting for at least 8 hours.  Glucose, capillary     Status: Abnormal   Collection Time: 01/14/23  4:26 PM  Result Value Ref Range   Glucose-Capillary 257 (H) 70 - 99 mg/dL    Comment: Glucose reference range applies only to samples taken after fasting for at least 8 hours.    Blood Alcohol level:  Lab Results  Component Value Date   ETH 72 (H) 01/08/2023   ETH 206 (H) 09/21/2021    Metabolic Disorder Labs: Lab Results  Component Value Date   HGBA1C 6.7 (H) 01/13/2023  MPG 145.59 01/13/2023   MPG 251.78 08/26/2021   No  results found for: "PROLACTIN" Lab Results  Component Value Date   CHOL 193 01/13/2023   TRIG 396 (H) 01/13/2023   HDL 30 (L) 01/13/2023   CHOLHDL 6.4 01/13/2023   VLDL 79 (H) 01/13/2023   LDLCALC 84 01/13/2023    Physical Findings: AIMS:  , ,  ,  ,    CIWA:  CIWA-Ar Total: 0 COWS:     Musculoskeletal: Strength & Muscle Tone: within normal limits Gait & Station: normal Patient leans: N/A  Psychiatric Specialty Exam:  Presentation  General Appearance:  Fairly Groomed; Appropriate for Environment  Eye Contact: Good  Speech: Clear and Coherent; Normal Rate  Speech Volume: Decreased  Handedness: Left   Mood and Affect  Mood: Anxious; Irritable  Affect: Depressed   Thought Process  Thought Processes: Coherent  Descriptions of Associations:Intact  Orientation:Full (Time, Place and Person) (and situation)  Thought Content:WDL  History of Schizophrenia/Schizoaffective disorder:No  Duration of Psychotic Symptoms:No data recorded Hallucinations:Hallucinations: None  Ideas of Reference:None  Suicidal Thoughts:Suicidal Thoughts: -- (denies)  Homicidal Thoughts:Homicidal Thoughts: No (denies)   Sensorium  Memory: Immediate Good; Remote Good  Judgment: Fair  Insight: Fair   Art therapist  Concentration: Good  Attention Span: Fair  Recall: Good  Fund of Knowledge: Good  Language: Good   Psychomotor Activity  Psychomotor Activity: Psychomotor Activity: Normal   Assets  Assets: Communication Skills; Social Support; Housing   Sleep  Sleep: Sleep: Good Number of Hours of Sleep: 7    Physical Exam: Physical Exam Vitals and nursing note reviewed.  Constitutional:      Appearance: Normal appearance.  HENT:     Head: Normocephalic and atraumatic.     Nose: Nose normal.  Pulmonary:     Effort: Pulmonary effort is normal.  Musculoskeletal:        General: Deformity present.     Cervical back: Normal range of  motion.  Neurological:     General: No focal deficit present.     Mental Status: He is alert and oriented to person, place, and time. Mental status is at baseline.  Psychiatric:        Attention and Perception: Attention and perception normal.        Mood and Affect: Mood is depressed. Affect is flat.        Speech: Speech normal.        Behavior: Behavior is withdrawn. Behavior is cooperative.        Thought Content: Thought content normal.        Cognition and Memory: Cognition and memory normal.        Judgment: Judgment normal.    ROS Blood pressure (!) 151/88, pulse 96, temperature 97.6 F (36.4 C), resp. rate 18, height 5\' 9"  (1.753 m), weight 83.9 kg, SpO2 97%. Body mass index is 27.32 kg/m.   Treatment Plan Summary: CIWA protocol with a clonazepam taper and Lyrica (Pregabalin) BID for pain management. Cymbalta (Duloxetine) 40 mg initiated for chronic pain and depression. Uncontrolled diabetes is being managed by the medical hospitalist team. Lamotrigine 25mg  (Lamictal):Weight-neutral and well-tolerated for bipolar depression. 01/14/2023, 6:56 PM

## 2023-01-14 NOTE — Progress Notes (Signed)
Patient Eric [name], continues to be asymptomatic.

## 2023-01-14 NOTE — Assessment & Plan Note (Signed)
-   I ordered high resistant subtenons NovoLog with fingerstick blood glucose every 4 hours. - I think 20 units of subcutaneous Lovenox with repeat blood glucose in 1 hour. - We will continue his basal coverage with Semglee at the same dose for now.

## 2023-01-14 NOTE — Group Note (Signed)
Recreation Therapy Group Note   Group Topic:Health and Wellness  Group Date: 01/14/2023 Start Time: 1530 End Time: 1615 Facilitators: Rosina Lowenstein, LRT, CTRS Location:  Dayroom  Group Description: Seated Exercise. LRT discussed the mental and physical benefits of exercise. LRT and group discussed how physical activity can be used as a coping skill. Pt's and LRT followed along to an exercise video on the TV screen that provided a visual representation and audio description of every exercise performed. Pt's encouraged to listen to their bodies and stop at any time if they experience feelings of discomfort or pain. Pts were encouraged to drink water and stay hydrated.   Goal Area(s) Addressed: Patient will learn benefits of physical activity. Patient will identify exercise as a coping skill.  Patient will follow multistep directions. Patient will try a new leisure interest.    Affect/Mood: Appropriate   Participation Level: Active   Participation Quality: Independent   Behavior: Appropriate   Speech/Thought Process: Coherent   Insight: Good   Judgement: Good   Modes of Intervention: Activity   Patient Response to Interventions:  Attentive and Interested    Education Outcome:  Acknowledges education   Clinical Observations/Individualized Feedback: Crandall was active in their participation of session activities and group discussion. Pt completed all exercises appropriately while interacting well with LRT and peers duration of session.    Plan: Continue to engage patient in RT group sessions 2-3x/week.   Rosina Lowenstein, LRT, CTRS 01/14/2023 4:57 PM

## 2023-01-14 NOTE — Plan of Care (Signed)
Patient stated that his depression is still there but anxiety is better. Patient's goal for today is " working on relationship with his wife." Patient appropriate with staff & peers. Denies SI,HI and AVH.Appetite and energy level good. Support and encouragement given.

## 2023-01-14 NOTE — Hospital Course (Signed)
HPI: Eric [name] is a 37 y.o. Caucasian male with medical history significant for anxiety, bipolar 2 disorder, type diabetes mellitus and hypertension, who was admitted to behavioral health 01/13/2023 for suicidal thoughts.  He has been having mild polyuria and polydipsia with elevated blood glucose of 406.  He has been compliant with diabetic regimen.   Hospitalist service consulted for medical management diabetes, HTN        ASSESSMENT & PLAN:   Uncontrolled type 1 diabetes mellitus with hyperglycemia, with long-term current use of insulin (HCC) high resistant subtenons NovoLog with fingerstick blood glucose every 4 hours has been transitioned to achs now that glc under better control  Basal w/ Semglee *** units  Po Glc gel prn    Peripheral neuropathy continue Lyrica.   Hx HTN BP controlled here Vitals daily / per protocol   Depression with suicidal ideation Bipolar 2 disorder Anxiety Management per psychiatry team On medication review, no concerning interactions or contraindications w/ medical plan above     *** based on BMI: Body mass index is 27.32 kg/m.  Underweight - under 18.5  normal weight - 18.5 to 24.9 overweight - 25 to 29.9 obese - 30 or more   DVT prophylaxis: *** IV fluids: *** continuous IV fluids  Nutrition: *** Central lines / invasive devices: ***  Code Status: *** ACP documentation reviewed: *** none on file in [facility]  TOC needs: *** Barriers to dispo / significant pending items: ***

## 2023-01-14 NOTE — Progress Notes (Signed)
Patient refusing Ativan orders. States he is not withdrawing.

## 2023-01-14 NOTE — Progress Notes (Signed)
Rechecked cbg according to order. CBG 38. Patient asymptomatic. Given 15 grams or carb, will re-check in 15 min. Notified Np to inform. Order to monitor per protocol, she will reach out to hospitalist.

## 2023-01-14 NOTE — Group Note (Signed)
Date:  01/14/2023 Time:  9:01 PM  Group Topic/Focus:  Wrap-Up Group:   The focus of this group is to help patients review their daily goal of treatment and discuss progress on daily workbooks.    Participation Level:  Active  Participation Quality:  Appropriate and Attentive  Affect:  Appropriate  Cognitive:  Alert and Appropriate  Insight: Appropriate and Good  Engagement in Group:  Engaged and Improving  Modes of Intervention:  Discussion, Education, and Support  Additional Comments:     Joleigh Mineau 01/14/2023, 9:01 PM

## 2023-01-14 NOTE — Group Note (Signed)
Date:  01/14/2023 Time:  3:51 PM  Group Topic/Focus:  Healthy Communication:   The focus of this group is to discuss communication, barriers to communication, as well as healthy ways to communicate with others. Music Therapy Art therapy    Participation Level:  Active  Participation Quality:  Appropriate  Affect:  Appropriate  Cognitive:  Appropriate  Insight: Appropriate  Engagement in Group:  Developing/Improving and Engaged  Modes of Intervention:  Activity  Additional Comments:    Addaleigh Nicholls 01/14/2023, 3:51 PM

## 2023-01-14 NOTE — Plan of Care (Signed)
°  Problem: Education: °Goal: Emotional status will improve °Outcome: Progressing °Goal: Mental status will improve °Outcome: Progressing °Goal: Verbalization of understanding the information provided will improve °Outcome: Progressing °  °

## 2023-01-14 NOTE — Progress Notes (Signed)
   01/14/23 1400  Spiritual Encounters  Type of Visit Initial  Care provided to: Patient  Conversation partners present during encounter Nurse   Nurse Tech talked to the chaplain and suggested that I speak with the patient because he is going through a difficult time right now with his family. Chaplain will follow up with patient tomorrow to see how he is and offer him pastoral care and emotional support.

## 2023-01-14 NOTE — Assessment & Plan Note (Signed)
-   He has been continued on Abilify and Lexapro.

## 2023-01-14 NOTE — Group Note (Signed)
LCSW Group Therapy Note   Group Date: 01/14/2023 Start Time: 1300 End Time: 1400   Type of Therapy and Topic:  Group Therapy: Challenging Core Beliefs  Participation Level:  Active  Description of Group:  Patients were educated about core beliefs and asked to identify one harmful core belief that they have. Patients were asked to explore from where those beliefs originate. Patients were asked to discuss how those beliefs make them feel and the resulting behaviors of those beliefs. They were then be asked if those beliefs are true and, if so, what evidence they have to support them. Lastly, group members were challenged to replace those negative core beliefs with helpful beliefs.   Therapeutic Goals:   1. Patient will identify harmful core beliefs and explore the origins of such beliefs. 2. Patient will identify feelings and behaviors that result from those core beliefs. 3. Patient will discuss whether such beliefs are true. 4.  Patient will replace harmful core beliefs with helpful ones.  Summary of Patient Progress:  Patient actively engaged in processing and exploring how core beliefs are formed and how they impact thoughts, feelings, and behaviors. Patient proved open to input from peers and feedback from CSW. Patient demonstrated proficient  insight into the subject matter, was respectful and supportive of peers, and participated throughout the entire session.  Therapeutic Modalities: Cognitive Behavioral Therapy; Solution-Focused Therapy   Lowry Ram, LCSWA 01/14/2023  2:04 PM

## 2023-01-14 NOTE — Progress Notes (Signed)
Nurse contacted this provider to inform of patient's blood sugar being 38.  Nurse stated that patient has been given "carbs" and that the patient is asymptomatic at this time.  Nurse states that she will recheck the BS in 15 min.  Advised Nurse to reach out to MD on call. She states she has but unfortunately it is unclear who is on call and that there was no answer. This provider put in a stat hospitalist consult and advised nurse to call Dr. Dayton Scrape.

## 2023-01-14 NOTE — Group Note (Signed)
Recreation Therapy Group Note   Group Topic:Coping Skills  Group Date: 01/14/2023 Start Time: 1010 End Time: 1105 Facilitators: Rosina Lowenstein, LRT, CTRS Location:  Craft Room  Group Description: Mind Map.  Patient was provided a blank template of a diagram with 32 blank boxes in a tiered system, branching from the center (similar to a bubble chart). LRT directed patients to label the middle of the diagram "Coping Skills". LRT and patients then came up with 8 different coping skills as examples. Pt were directed to record their coping skills in the 2nd tier boxes closest to the center.  Patients would then share their coping skills with the group as LRT wrote them out. LRT gave a handout of 99 different coping skills at the end of group.   Goal Area(s) Addressed: Patients will be able to define "coping skills". Patient will identify new coping skills.  Patient will increase communication.   Affect/Mood: Appropriate   Participation Level: Active and Engaged   Participation Quality: Independent   Behavior: Appropriate, Calm, and Cooperative   Speech/Thought Process: Coherent   Insight: Good   Judgement: Good   Modes of Intervention: Education, Group work, Guided Discussion, and Worksheet   Patient Response to Interventions:  Attentive, Engaged, Interested , and Receptive   Education Outcome:  Acknowledges education   Clinical Observations/Individualized Feedback: Eric [name] was active in their participation of session activities and group discussion. Pt identified "tv and board games" as coping skills. Pt spontaneously contributed to group discussion. Pt interacted well with LRT and peers duration of session.    Plan: Continue to engage patient in RT group sessions 2-3x/week.   Rosina Lowenstein, LRT, CTRS 01/14/2023 12:26 PM

## 2023-01-14 NOTE — Assessment & Plan Note (Signed)
-   Management per psychiatry with Lexapro and Abilify.

## 2023-01-14 NOTE — Assessment & Plan Note (Addendum)
-   She is on Valium and Vistaril.

## 2023-01-14 NOTE — BH IP Treatment Plan (Signed)
Interdisciplinary Treatment and Diagnostic Plan Update  01/14/2023 Time of Session: 9:36AM Eric [name] MRN: [id number]  Principal Diagnosis: Uncontrolled type 1 diabetes mellitus with hyperglycemia, with long-term current use of insulin (HCC)  Secondary Diagnoses: Principal Problem:   Uncontrolled type 1 diabetes mellitus with hyperglycemia, with long-term current use of insulin (HCC) Active Problems:   Low back pain   Manic bipolar I disorder (HCC)   Chronic pain syndrome   Long term prescription opiate use   Benzodiazepine dependence (HCC)   Bipolar 2 disorder (HCC)   Depression with suicidal ideation   Anxiety   Peripheral neuropathy   Current Medications:  Current Facility-Administered Medications  Medication Dose Route Frequency Provider Last Rate Last Admin   alum & mag hydroxide-simeth (MAALOX/MYLANTA) 200-200-20 MG/5ML suspension 30 mL  30 mL Oral Q4H PRN Sindy Guadeloupe, NP       ARIPiprazole (ABILIFY) tablet 5 mg  5 mg Oral Daily Myriam Forehand, NP   5 mg at 01/14/23 0831   clonazePAM (KLONOPIN) tablet 0.5 mg  0.5 mg Oral BID Myriam Forehand, NP       Melene Muller ON 01/15/2023] clonazePAM Scarlette Calico) tablet 0.5 mg  0.5 mg Oral Once Myriam Forehand, NP       dextrose (GLUTOSE) oral gel 40%  1 Tube Oral PRN Sunnie Nielsen, DO       DULoxetine (CYMBALTA) DR capsule 20 mg  20 mg Oral Daily Myriam Forehand, NP   20 mg at 01/14/23 0831   folic acid (FOLVITE) tablet 1 mg  1 mg Oral Daily Myriam Forehand, NP   1 mg at 01/14/23 0831   HYDROcodone-acetaminophen (NORCO/VICODIN) 5-325 MG per tablet 1 tablet  1 tablet Oral Q6H PRN Myriam Forehand, NP   1 tablet at 01/13/23 2115   hydrOXYzine (ATARAX) tablet 25 mg  25 mg Oral TID PRN Jearld Lesch, NP   25 mg at 01/13/23 2115   insulin aspart (novoLOG) injection 0-20 Units  0-20 Units Subcutaneous TID [facility] Sunnie Nielsen, DO   11 Units at 01/14/23 7829   insulin glargine-yfgn (SEMGLEE) injection 35 Units  35 Units Subcutaneous Daily Myriam Forehand, NP   35 Units at 01/14/23 0830   LORazepam (ATIVAN) tablet 1-4 mg  1-4 mg Oral Q1H PRN Myriam Forehand, NP       Or   LORazepam (ATIVAN) injection 1-4 mg  1-4 mg Intravenous Q1H PRN Myriam Forehand, NP       LORazepam (ATIVAN) tablet 0-4 mg  0-4 mg Oral Q6H Myriam Forehand, NP   2 mg at 01/13/23 0500   Followed by   LORazepam (ATIVAN) tablet 0-4 mg  0-4 mg Oral Q12H Myriam Forehand, NP       magnesium hydroxide (MILK OF MAGNESIA) suspension 30 mL  30 mL Oral Daily PRN Sindy Guadeloupe, NP       meloxicam (MOBIC) tablet 7.5 mg  7.5 mg Oral Daily Myriam Forehand, NP   7.5 mg at 01/14/23 5621   multivitamin with minerals tablet 1 tablet  1 tablet Oral Daily Myriam Forehand, NP   1 tablet at 01/14/23 0832   OLANZapine (ZYPREXA) injection 10 mg  10 mg Intramuscular TID PRN Sindy Guadeloupe, NP       OLANZapine (ZYPREXA) injection 5 mg  5 mg Intramuscular TID PRN Sindy Guadeloupe, NP       OLANZapine zydis (ZYPREXA) disintegrating tablet 5 mg  5 mg Oral TID PRN Sindy Guadeloupe,  NP       pregabalin (LYRICA) capsule 100 mg  100 mg Oral BID Myriam Forehand, NP   100 mg at 01/14/23 0831   thiamine (VITAMIN B1) tablet 100 mg  100 mg Oral Daily Myriam Forehand, NP   100 mg at 01/14/23 2440   Or   thiamine (VITAMIN B1) injection 100 mg  100 mg Intravenous Daily Myriam Forehand, NP       traZODone (DESYREL) tablet 150 mg  150 mg Oral QHS Myriam Forehand, NP   150 mg at 01/13/23 2114   PTA Medications: Medications Prior to Admission  Medication Sig Dispense Refill Last Dose   acetaminophen (TYLENOL) 500 MG tablet Take 1,000 mg by mouth every 6 (six) hours as needed for moderate pain.      ARIPiprazole (ABILIFY) 5 MG tablet Take 5 mg by mouth daily.      diazepam (VALIUM) 10 MG tablet Take 1 tablet (10 mg total) by mouth every 6 (six) hours as needed. (Patient taking differently: Take 10 mg by mouth every 6 (six) hours as needed for anxiety.) 30 tablet 0    escitalopram (LEXAPRO) 10 MG tablet Take 10 mg by mouth daily.       HYDROcodone-acetaminophen (NORCO/VICODIN) 5-325 MG tablet Take 1 tablet by mouth every 6 (six) hours as needed. (Patient not taking: Reported on 03/30/2022) 15 tablet 0    hydrOXYzine (VISTARIL) 25 MG capsule Take 25 mg by mouth 3 (three) times daily.      insulin lispro (HUMALOG) 100 UNIT/ML injection Inject 1-10 Units into the skin 3 (three) times daily before meals. (Patient taking differently: 1-10 Units 3 (three) times daily with meals. Based on sliding scale/carb intake-via pump) 10 mL 12    meloxicam (MOBIC) 7.5 MG tablet Take 1 tablet (7.5 mg total) by mouth daily. 60 tablet 5    naloxone (NARCAN) nasal spray 4 mg/0.1 mL Place 1 spray into the nose once.      phentermine 15 MG capsule Take 15 mg by mouth in the morning.      pregabalin (LYRICA) 100 MG capsule Take 100 mg by mouth 3 (three) times daily.       Patient Stressors:    Patient Strengths:    Treatment Modalities: Medication Management, Group therapy, Case management,  1 to 1 session with clinician, Psychoeducation, Recreational therapy.   Physician Treatment Plan for Primary Diagnosis: Uncontrolled type 1 diabetes mellitus with hyperglycemia, with long-term current use of insulin (HCC) Long Term Goal(s): Improvement in symptoms so as ready for discharge   Short Term Goals: Ability to identify changes in lifestyle to reduce recurrence of condition will improve Ability to verbalize feelings will improve Ability to disclose and discuss suicidal ideas Ability to demonstrate self-control will improve Ability to identify and develop effective coping behaviors will improve Ability to maintain clinical measurements within normal limits will improve Compliance with prescribed medications will improve Ability to identify triggers associated with substance abuse/mental health issues will improve  Medication Management: Evaluate patient's response, side effects, and tolerance of medication regimen.  Therapeutic Interventions: 1 to  1 sessions, Unit Group sessions and Medication administration.  Evaluation of Outcomes: Progressing  Physician Treatment Plan for Secondary Diagnosis: Principal Problem:   Uncontrolled type 1 diabetes mellitus with hyperglycemia, with long-term current use of insulin (HCC) Active Problems:   Low back pain   Manic bipolar I disorder (HCC)   Chronic pain syndrome   Long term prescription opiate use   Benzodiazepine  dependence (HCC)   Bipolar 2 disorder (HCC)   Depression with suicidal ideation   Anxiety   Peripheral neuropathy  Long Term Goal(s): Improvement in symptoms so as ready for discharge   Short Term Goals: Ability to identify changes in lifestyle to reduce recurrence of condition will improve Ability to verbalize feelings will improve Ability to disclose and discuss suicidal ideas Ability to demonstrate self-control will improve Ability to identify and develop effective coping behaviors will improve Ability to maintain clinical measurements within normal limits will improve Compliance with prescribed medications will improve Ability to identify triggers associated with substance abuse/mental health issues will improve     Medication Management: Evaluate patient's response, side effects, and tolerance of medication regimen.  Therapeutic Interventions: 1 to 1 sessions, Unit Group sessions and Medication administration.  Evaluation of Outcomes: Progressing   RN Treatment Plan for Primary Diagnosis: Uncontrolled type 1 diabetes mellitus with hyperglycemia, with long-term current use of insulin (HCC) Long Term Goal(s): Knowledge of disease and therapeutic regimen to maintain health will improve  Short Term Goals: Ability to demonstrate self-control, Ability to participate in decision making will improve, Ability to verbalize feelings will improve, Ability to disclose and discuss suicidal ideas, Ability to identify and develop effective coping behaviors will improve, and  Compliance with prescribed medications will improve  Medication Management: RN will administer medications as ordered by provider, will assess and evaluate patient's response and provide education to patient for prescribed medication. RN will report any adverse and/or side effects to prescribing provider.  Therapeutic Interventions: 1 on 1 counseling sessions, Psychoeducation, Medication administration, Evaluate responses to treatment, Monitor vital signs and CBGs as ordered, Perform/monitor CIWA, COWS, AIMS and Fall Risk screenings as ordered, Perform wound care treatments as ordered.  Evaluation of Outcomes: Progressing   LCSW Treatment Plan for Primary Diagnosis: Uncontrolled type 1 diabetes mellitus with hyperglycemia, with long-term current use of insulin (HCC) Long Term Goal(s): Safe transition to appropriate next level of care at discharge, Engage patient in therapeutic group addressing interpersonal concerns.  Short Term Goals: Engage patient in aftercare planning with referrals and resources, Increase social support, Increase ability to appropriately verbalize feelings, Increase emotional regulation, Facilitate acceptance of mental health diagnosis and concerns, Facilitate patient progression through stages of change regarding substance use diagnoses and concerns, Identify triggers associated with mental health/substance abuse issues, and Increase skills for wellness and recovery  Therapeutic Interventions: Assess for all discharge needs, 1 to 1 time with Social worker, Explore available resources and support systems, Assess for adequacy in community support network, Educate family and significant other(s) on suicide prevention, Complete Psychosocial Assessment, Interpersonal group therapy.  Evaluation of Outcomes: Progressing   Progress in Treatment: Attending groups: Yes. Participating in groups: Yes. Taking medication as prescribed: Yes. Toleration medication:  Yes. Family/Significant other contact made: No, will contact:  once permission has been granted Patient understands diagnosis: Yes. Discussing patient identified problems/goals with staff: Yes. Medical problems stabilized or resolved: Yes. Denies suicidal/homicidal ideation: Yes. Issues/concerns per patient self-inventory: No. Other: none  New problem(s) identified: No, Describe:  none  New Short Term/Long Term Goal(s): detox, elimination of symptoms of psychosis, medication management for mood stabilization; elimination of SI thoughts; development of comprehensive mental wellness/sobriety plan.   Patient Goals:  "work on myself"  Discharge Plan or Barriers: CSW to assist in the development of appropriate discharge plans.  Patient reports plans to return to his mothers home, however, reports that he is not aligned with aftercare plans at this time.  Reason for Continuation of Hospitalization: Anxiety Depression Medication stabilization Suicidal ideation  Estimated Length of Stay:  1-7 days  Last 3 [place] Suicide Severity Risk Score: Flowsheet Row Admission (Current) from 01/12/2023 in [facility] ED from 01/08/2023 in [facility] Emergency Department at [facility] ED from 03/30/2022 in [facility] Emergency Department at [facility]  C-SSRS RISK CATEGORY No Risk No Risk No Risk       Last PHQ 2/9 Scores:     No data to display          Scribe for Treatment Team: Harden Mo, Alexander Mt 01/14/2023 10:38 AM

## 2023-01-14 NOTE — Progress Notes (Signed)
Brief hospitalist note  Reviewed Glc  Insulin: Novolog moderate sliding scale + 6 units tid ac Novolog HS sliding scale coverage  Semglee 35 units daily   Will get BMP and CBC w/ AM labs   Will monitor peripherally for Glc control  Appreciate recs from diabetes coordinator

## 2023-01-14 NOTE — [facility] Suicide Risk Assessment (Signed)
[facility] INPATIENT:  Family/Significant Other Suicide Prevention Education  Suicide Prevention Education:  Education Completed; Kahekili Gorbett, mother, [phone] ,  has been identified by the patient as the family member/significant other with whom the patient will be residing, and identified as the person(s) who will aid the patient in the event of a mental health crisis (suicidal ideations/suicide attempt).  With written consent from the patient, the family member/significant other has been provided the following suicide prevention education, prior to the and/or following the discharge of the patient.  The suicide prevention education provided includes the following: Suicide risk factors Suicide prevention and interventions National Suicide Hotline telephone number [facility] assessment telephone number [facility] Emergency Assistance 911 [facility] and/or Residential Mobile Crisis Unit telephone number  Request made of family/significant other to: Remove weapons (e.g., guns, rifles, knives), all items previously/currently identified as safety concern.   Remove drugs/medications (over-the-counter, prescriptions, illicit drugs), all items previously/currently identified as a safety concern.  The family member/significant other verbalizes understanding of the suicide prevention education information provided.  The family member/significant other agrees to remove the items of safety concern listed above.  Lowry Ram 01/14/2023, 3:05 PM

## 2023-01-15 DIAGNOSIS — E1065 Type 1 diabetes mellitus with hyperglycemia: Secondary | ICD-10-CM

## 2023-01-15 LAB — BASIC METABOLIC PANEL
Anion gap: 8 (ref 5–15)
BUN: 19 mg/dL (ref 6–20)
CO2: 28 mmol/L (ref 22–32)
Calcium: 8.9 mg/dL (ref 8.9–10.3)
Chloride: 98 mmol/L (ref 98–111)
Creatinine, Ser: 1.17 mg/dL (ref 0.61–1.24)
GFR, Estimated: >60 mL/min (ref >60)
Glucose, Bld: 372 mg/dL — ABNORMAL HIGH (ref 70–99)
Potassium: 5.3 mmol/L — ABNORMAL HIGH (ref 3.5–5.1)
Sodium: 134 mmol/L — ABNORMAL LOW (ref 135–145)

## 2023-01-15 LAB — CBC
HCT: 41.6 % (ref 39.0–52.0)
Hemoglobin: 14.5 g/dL (ref 13.0–17.0)
MCH: 28.8 pg (ref 26.0–34.0)
MCHC: 34.9 g/dL (ref 30.0–36.0)
MCV: 82.5 fL (ref 80.0–100.0)
Platelets: 177 10*3/uL (ref 150–400)
RBC: 5.04 MIL/uL (ref 4.22–5.81)
RDW: 11.9 % (ref 11.5–15.5)
WBC: 4.4 10*3/uL (ref 4.0–10.5)
nRBC: 0 % (ref 0.0–0.2)

## 2023-01-15 LAB — GLUCOSE, CAPILLARY
Glucose-Capillary: 387 mg/dL — ABNORMAL HIGH (ref 70–99)
Glucose-Capillary: 483 mg/dL — ABNORMAL HIGH (ref 70–99)

## 2023-01-15 MED ORDER — ARIPIPRAZOLE 5 MG PO TABS: 5.0000 mg | ORAL_TABLET | Freq: Every day | ORAL | 0 refills | Status: DC

## 2023-01-15 MED ORDER — DULOXETINE HCL 20 MG PO CPEP: 20.0000 mg | ORAL_CAPSULE | Freq: Every day | ORAL | 0 refills | Status: DC

## 2023-01-15 MED ORDER — TRAZODONE HCL 150 MG PO TABS: 150.0000 mg | ORAL_TABLET | Freq: Every day | ORAL | 0 refills | Status: DC

## 2023-01-15 MED ORDER — INSULIN ASPART 100 UNIT/ML IJ SOLN
10.0000 [IU] | Freq: Three times a day (TID) | INTRAMUSCULAR | Status: DC
  Administered 2023-01-15: 10 [IU] via SUBCUTANEOUS
  Filled 2023-01-15: qty 1

## 2023-01-15 MED ORDER — HYDROXYZINE HCL 25 MG PO TABS: 25.0000 mg | ORAL_TABLET | Freq: Three times a day (TID) | ORAL | 0 refills | Status: AC | PRN

## 2023-01-15 MED ORDER — LAMOTRIGINE 25 MG PO TABS: 25.0000 mg | ORAL_TABLET | Freq: Every day | ORAL | 0 refills | Status: DC

## 2023-01-15 NOTE — [facility] Suicide Risk Assessment (Signed)
[facility] INPATIENT:  Family/Significant Other Suicide Prevention Education  Suicide Prevention Education:  Education Completed; wife, Delson Joens, [phone] ,  (name of family member/significant other) has been identified by the patient as the family member/significant other with whom the patient will be residing, and identified as the person(s) who will aid the patient in the event of a mental health crisis (suicidal ideations/suicide attempt).  With written consent from the patient, the family member/significant other has been provided the following suicide prevention education, prior to the and/or following the discharge of the patient.  The suicide prevention education provided includes the following: Suicide risk factors Suicide prevention and interventions National Suicide Hotline telephone number [facility] assessment telephone number [facility] Emergency Assistance 911 [facility] and/or Residential Mobile Crisis Unit telephone number  Request made of family/significant other to: Remove weapons (e.g., guns, rifles, knives), all items previously/currently identified as safety concern.   Remove drugs/medications (over-the-counter, prescriptions, illicit drugs), all items previously/currently identified as a safety concern.  The family member/significant other verbalizes understanding of the suicide prevention education information provided.  The family member/significant other agrees to remove the items of safety concern listed above.  Harden Mo 01/15/2023, 9:49 AM

## 2023-01-15 NOTE — [facility] Suicide Risk Assessment (Addendum)
[facility] Discharge Suicide Risk Assessment   Principal Problem: Uncontrolled type 1 diabetes mellitus with hyperglycemia, with long-term current use of insulin (HCC) Discharge Diagnoses: Principal Problem:   Uncontrolled type 1 diabetes mellitus with hyperglycemia, with long-term current use of insulin (HCC) Active Problems:   Low back pain   Manic bipolar I disorder (HCC)   Chronic pain syndrome   Long term prescription opiate use   Benzodiazepine dependence (HCC)   Bipolar 2 disorder (HCC)   Depression with suicidal ideation   Anxiety   Peripheral neuropathy   Total Time spent with patient: 45 minutes  Musculoskeletal: Strength & Muscle Tone: within normal limits Gait & Station: normal Patient leans: N/A  Psychiatric Specialty Exam: Physical Exam Vitals and nursing note reviewed.  Constitutional:      Appearance: Normal appearance.  HENT:     Head: Normocephalic.     Nose: Nose normal.  Pulmonary:     Effort: Pulmonary effort is normal.  Musculoskeletal:        General: Normal range of motion.     Cervical back: Normal range of motion.  Neurological:     General: No focal deficit present.     Mental Status: He is alert and oriented to person, place, and time.     Review of Systems  All other systems reviewed and are negative.   Blood pressure 121/77, pulse 71, temperature (!) 97.3 F (36.3 C), resp. rate 16, height 5\' 9"  (1.753 m), weight 83.9 kg, SpO2 94%.Body mass index is 27.32 kg/m.  General Appearance: Casual  Eye Contact:  Good  Speech:  Normal Rate  Volume:  Normal  Mood:  Euthymic  Affect:  Congruent  Thought Process:  Coherent and Descriptions of Associations: Intact  Orientation:  Full (Time, Place, and Person)  Thought Content:  WDL and Logical  Suicidal Thoughts:  No  Homicidal Thoughts:  No  Memory:  Immediate;   Good Recent;   Good Remote;   Good  Judgement:  Good  Insight:  Good  Psychomotor Activity:  Normal  Concentration:   Concentration: Good and Attention Span: Good  Recall:  Good  Fund of Knowledge:  Good  Language:  Good  Akathisia:  No  Handed:  Right  AIMS (if indicated):     Assets:  Housing Leisure Time Physical Health Resilience Social Support  ADL's:  Intact  Cognition:  WNL  Sleep:         Physical Exam: Physical Exam Vitals and nursing note reviewed.  Constitutional:      Appearance: Normal appearance.  HENT:     Head: Normocephalic.     Nose: Nose normal.  Pulmonary:     Effort: Pulmonary effort is normal.  Musculoskeletal:        General: Normal range of motion.     Cervical back: Normal range of motion.  Neurological:     General: No focal deficit present.     Mental Status: He is alert and oriented to person, place, and time.    Review of Systems  All other systems reviewed and are negative.  Blood pressure 121/77, pulse 71, temperature (!) 97.3 F (36.3 C), resp. rate 16, height 5\' 9"  (1.753 m), weight 83.9 kg, SpO2 94%. Body mass index is 27.32 kg/m.  Mental Status Per Nursing Assessment::   On Admission:  NA  Demographic Factors:  Male and Caucasian  Loss Factors: NA  Historical Factors: NA  Risk Reduction Factors:   Sense of responsibility to family, Living with  another person, especially a relative, and Positive social support  Continued Clinical Symptoms:  None  Cognitive Features That Contribute To Risk:  None    Suicide Risk:  Minimal: No identifiable suicidal ideation.  Patients presenting with no risk factors but with morbid ruminations; may be classified as minimal risk based on the severity of the depressive symptoms   Plan Of Care/Follow-up recommendations:  Activity:  as tolerated  Diet:  heart healthy diet Bipolar affective disorder: Lamictal 25 mg daily Abilify 5 mg daily  Cymbalta 20 mg daily  General anxiety disorder: Hydroxyzine 25 mg TID PRN  Insomnia: Trazodone 150 mg daily at bedtime   Nanine Means, NP 01/15/2023,  9:58 AM

## 2023-01-15 NOTE — Group Note (Signed)
Recreation Therapy Group Note   Group Topic:Self-Esteem  Group Date: 01/15/2023 Start Time: 1005 End Time: 1045 Facilitators: Rosina Lowenstein, LRT, CTRS Location:  Craft Room  Group Description: My strengths and Qualities. Patients and LRT discussed the importance of self-love/self-esteem and things that cause it to fluctuate, including our mental health or state. Pt completed a worksheet that helps them identify 24 different strengths and qualities about themselves. Pt encouraged to read aloud at least 3 off their sheet to the group. LRT and pts discussed how this can be applied to daily life post-discharge.  Pt's then played "Positive Affirmation Bingo" afterwards, with stress balls as prizes.     Goal Area(s) Addressed: Patient will identify positive qualities about themselves. Patient will learn new positive affirmations.  Patient will recite positive qualities and affirmations aloud to the group.  Patient will practice positive self-talk.  Patient will increase communication.   Affect/Mood: Appropriate   Participation Level: Active and Engaged   Participation Quality: Independent   Behavior: Appropriate, Calm, and Cooperative   Speech/Thought Process: Coherent   Insight: Good   Judgement: Good   Modes of Intervention: Education, Guided Discussion, and Worksheet   Patient Response to Interventions:  Attentive, Engaged, Interested , and Receptive   Education Outcome:  Acknowledges education   Clinical Observations/Individualized Feedback: Eric [name] was active in their participation of session activities and group discussion. Pt identified "What I like most about my appearance is my beard, challenges I have overcome is losing my fingers, and something I am good at is working on cars." Pt spontaneously contributed to group discussion while interacting well with LRT and peers duration of session.  Plan: Continue to engage patient in RT group sessions 2-3x/week.   Rosina Lowenstein, LRT, CTRS 01/15/2023 12:30 PM

## 2023-01-15 NOTE — Progress Notes (Signed)
   01/15/23 1500  Psychosocial Assessment  Eye Contact Brief  Facial Expression Worried  Affect Flat  Speech Soft  Interaction Assertive  Motor Activity Slow  Appearance/Hygiene Unremarkable  Thought Process  Coherency WDL  Content WDL  Delusions None reported or observed  Perception WDL  Hallucination None reported or observed  Judgment Impaired  Confusion None  Danger to Self  Current suicidal ideation? Denies  Danger to Others  Danger to Others None reported or observed   Patient discharged at this time in care of spouse. All discharge instructions given to the patient with acknowledgement. Denies SI/HI/AVH.

## 2023-01-15 NOTE — Group Note (Signed)
Date:  01/15/2023 Time:  10:37 AM  Group Topic/Focus:  Goals Group:   The focus of this group is to help patients establish daily goals to achieve during treatment and discuss how the patient can incorporate goal setting into their daily lives to aide in recovery. Recovery Goals:   The focus of this group is to identify appropriate goals for recovery and establish a plan to achieve them. Relapse Prevention Planning:   The focus of this group is to define relapse and discuss the need for planning to combat relapse.    Participation Level:  Active  Participation Quality:  Appropriate and Attentive  Affect:  Appropriate  Cognitive:  Alert, Appropriate, and Oriented  Insight: Appropriate  Engagement in Group:  Developing/Improving and Engaged  Modes of Intervention:  Activity, Discussion, Education, Socialization, and Support  Additional Comments:    Rosaura Carpenter 01/15/2023, 10:37 AM

## 2023-01-15 NOTE — Inpatient Diabetes Management (Signed)
Inpatient Diabetes Program Recommendations  AACE/ADA: New Consensus Statement on Inpatient Glycemic Control (2015)  Target Ranges:  Prepandial:   less than 140 mg/dL      Peak postprandial:   less than 180 mg/dL (1-2 hours)      Critically ill patients:  140 - 180 mg/dL    Latest Reference Range & Units 01/14/23 07:59 01/14/23 11:54 01/14/23 16:26 01/14/23 21:05  Glucose-Capillary 70 - 99 mg/dL 161 (H)  11 units Novolog  35 units Semglee  346 (H)  15 units Novolog  257 (H)  14 units Novolog  343 (H)  4 units Novolog   (H): Data is abnormally high  Latest Reference Range & Units 01/15/23 07:49 01/15/23 11:48  Glucose-Capillary 70 - 99 mg/dL 096 (H)  21 units Novolog  483 (H)  10 units Novolog   (H): Data is abnormally high   History: Type 1 diabetes, Anxiety, Depression, and Bipolar    Home DM Meds: Insulin Pump (Omni Pod) + Dexcom G6 CGM   Current Orders: Novolog Moderate Correction Scale/ SSI (0-15 units) TID AC + HS      Novolog 10 units TID with meals                           Semglee 35 units Daily     Note Novolog Meal Coverage increased to 10 units TID with meals at 12pm today  Note discharge orders, however, CBG 483 at lunch  If d/c held due to Hyperglycemia, please consider:  Increase Semglee to 42 units Daily (0.5 units/kg)  Give an extra 7 units Semglee X 1 dose this afternoon    --Will follow patient during hospitalization--  Ambrose Finland RN, MSN, CDCES Diabetes Coordinator Inpatient Glycemic Control Team Team Pager: [phone] (8a-5p)

## 2023-01-15 NOTE — BHH Counselor (Addendum)
CSW spoke with the patient's wife, Cesare Newingham, [phone].  Jami NP was also present for the conversation.    Per wife she will lock the weapons up in the home and have her father take the 29.  She reports there are no bullets in the home.  CSW spoke with wife about reports that pt was attempting to get his gun from his fathers.  Initial reports indicate that the wife reported this.  Wife denies this statement.  She reports that the father no longer has the gun that the patient gave him.  She reports no concerns for the patient returning home.   Penni Homans, MSW, LCSW 01/15/2023 9:48 AM

## 2023-01-15 NOTE — Progress Notes (Signed)
Nursing Shift Note:  1900-0700  Attended Evening Group: Yes Medication Compliant:  Yes Behavior: cooperative; calm Sleep Quality: good Significant Changes: Pt's wife visited at visitation time.  Pt feels he may discharge on 12/3.     01/15/23 0300  Psych Admission Type (Psych Patients Only)  Admission Status Involuntary  Psychosocial Assessment  Patient Complaints Depression  Eye Contact Fair  Facial Expression Animated  Affect Appropriate to circumstance  Speech Soft  Interaction Assertive  Motor Activity Slow  Appearance/Hygiene Unremarkable  Behavior Characteristics Cooperative  Mood Sad  Thought Process  Coherency WDL  Content WDL  Delusions None reported or observed  Perception WDL  Hallucination None reported or observed  Judgment Impaired  Confusion None  Danger to Self  Current suicidal ideation? Denies  Danger to Others  Danger to Others None reported or observed

## 2023-01-15 NOTE — Plan of Care (Signed)
  Problem: Education: Goal: Emotional status will improve Outcome: Progressing   Problem: Education: Goal: Mental status will improve Outcome: Progressing   Problem: Education: Goal: Verbalization of understanding the information provided will improve Outcome: Progressing   

## 2023-01-15 NOTE — Group Note (Signed)
[facility] LCSW Group Therapy Note   Group Date: 01/15/2023 Start Time: 1300 End Time: 1400  Type of Therapy/Topic:  Group Therapy:  Feelings about Diagnosis  Participation Level:  Active    Description of Group:    This group will allow patients to explore their thoughts and feelings about diagnoses they have received. Patients will be guided to explore their level of understanding and acceptance of these diagnoses. Facilitator will encourage patients to process their thoughts and feelings about the reactions of others to their diagnosis, and will guide patients in identifying ways to discuss their diagnosis with significant others in their lives. This group will be process-oriented, with patients participating in exploration of their own experiences as well as giving and receiving support and challenge from other group members.   Therapeutic Goals: 1. Patient will demonstrate understanding of diagnosis as evidence by identifying two or more symptoms of the disorder:  2. Patient will be able to express two feelings regarding the diagnosis 3. Patient will demonstrate ability to communicate their needs through discussion and/or role plays  Summary of Patient Progress: Patient was present for the entirety of the group process. He was actively involved in the discussion. He shared that his diagnosis just "is what it is." However, later he admitted that he feels like during the day he is trying to run from it/prove himself to be normal. Stigma and support systems were discussed. He identified his family as being very supportive. Pt shared that since his child died he has been depressed. Grief was discussed as a process that we never really get over but learn to live with and through. Pt appeared to accept this information. He appeared open and receptive to feedback/comments from his peers and facilitator.    Therapeutic Modalities:   Cognitive Behavioral Therapy Brief Therapy Feelings Identification     Glenis Smoker, LCSW

## 2023-01-15 NOTE — Discharge Summary (Addendum)
Physician Discharge Summary Note  Patient:  Eric [name] is an 37 y.o., male MRN:  [id number] DOB:  [birth year] Patient phone:  [phone] (home)  Patient address:   [address] [place] [address],  Total Time spent with patient: 45 minutes  Date of Admission:  01/12/2023 Date of Discharge: 01/15/2023  Reason for Admission:  suicide threat   Principal Problem: Uncontrolled type 1 diabetes mellitus with hyperglycemia, with long-term current use of insulin (HCC) Discharge Diagnoses: Principal Problem:   Uncontrolled type 1 diabetes mellitus with hyperglycemia, with long-term current use of insulin (HCC) Active Problems:   Low back pain   Manic bipolar I disorder (HCC)   Chronic pain syndrome   Long term prescription opiate use   Benzodiazepine dependence (HCC)   Bipolar 2 disorder (HCC)   Depression with suicidal ideation   Anxiety   Peripheral neuropathy   Past Psychiatric History: bipolar d/o, depression, anxiety  Past Medical History:  Past Medical History:  Diagnosis Date   Anxiety    Bipolar 2 disorder, major depressive episode (HCC)    Depression    Diabetes mellitus    Hypertension     Past Surgical History:  Procedure Laterality Date   AMPUTATION Right    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   FRACTURE SURGERY     reconstructive left knee surgery   KNEE SURGERY     Family History:  Family History  Problem Relation Age of Onset   Hypertension Mother    Hypertension Father    Family Psychiatric  History: none Social History:  Social History   Substance and Sexual Activity  Alcohol Use Not Currently     Social History   Substance and Sexual Activity  Drug Use Not Currently    Social History   Socioeconomic History   Marital status: Married    Spouse name: Not on file   Number of children: Not on file   Years of education: Not on file   Highest education level: Not on file  Occupational History   Not on file  Tobacco Use   Smoking  status: Never   Smokeless tobacco: Current    Types: Snuff  Vaping Use   Vaping status: Never Used  Substance and Sexual Activity   Alcohol use: Not Currently   Drug use: Not Currently   Sexual activity: Not on file  Other Topics Concern   Not on file  Social History Narrative   Not on file   Social Determinants of Health   Financial Resource Strain: Patient Declined (04/05/2022)   Received from [facility], [facility]   Overall Financial Resource Strain (CARDIA)    Difficulty of Paying Living Expenses: Patient declined  Recent Concern: Financial Resource Strain - Medium Risk (02/28/2022)   Received from [facility], [facility]   Overall Financial Resource Strain (CARDIA)    Difficulty of Paying Living Expenses: Somewhat hard  Food Insecurity: No Food Insecurity (01/12/2023)   Hunger Vital Sign    Worried About Running Out of Food in the Last Year: Never true    Ran Out of Food in the Last Year: Never true  Transportation Needs: No Transportation Needs (01/12/2023)   PRAPARE - Administrator, Civil Service (Medical): No    Lack of Transportation (Non-Medical): No  Physical Activity: Sufficiently Active (12/11/2021)   Received from [facility], [facility]   Exercise Vital Sign    Days of Exercise per Week: 2 days  Minutes of Exercise per Session: 120 min  Stress: Patient Declined (12/11/2021)   Received from [facility], [facility]ational [facility] - Occupational Stress Questionnaire    Feeling of Stress : Patient declined  Social Connections: Unknown (06/13/2021)   Received from [facility]   Social Network    Social Network: Not on file    Hospital Course:   36 yo male presented to the ED under IVC for alleged suicide threat.  He adamantly denied this throughout the stay with his wife today reporting she was misunderstood.  Regardless, medications adjusted and therapy initiated.   Denies suicidal/homicidal ideations, hallucinations, or withdrawal symptoms.  Patient has met maximum capacity for hospitalization.  Discharge instructions provided with explanations along with crisis numbers, Rx, and follow up information.  Caveat:  Collateral information obtained with patient's permission.  His wife, Daaiel Beardmore, stated she had no safety concerns for his discharge and the only gun in the home is hers with no bullets.  She was agreeable to removing it out of the home prior to him returning.  Witness with Child psychotherapist, [employer].  Musculoskeletal: Strength & Muscle Tone: within normal limits Gait & Station: normal Patient leans: N/A  Psychiatric Specialty Exam: Physical Exam Vitals and nursing note reviewed.  Constitutional:      Appearance: Normal appearance.  HENT:     Head: Normocephalic.     Nose: Nose normal.  Pulmonary:     Effort: Pulmonary effort is normal.  Musculoskeletal:        General: Normal range of motion.     Cervical back: Normal range of motion.  Neurological:     General: No focal deficit present.     Mental Status: He is alert and oriented to person, place, and time.     Review of Systems  All other systems reviewed and are negative.   Blood pressure 121/77, pulse 71, temperature (!) 97.3 F (36.3 C), resp. rate 16, height 5\' 9"  (1.753 m), weight 83.9 kg, SpO2 94%.Body mass index is 27.32 kg/m.  General Appearance: Casual  Eye Contact:  Good  Speech:  Normal Rate  Volume:  Normal  Mood:  Euthymic  Affect:  Congruent  Thought Process:  Coherent and Descriptions of Associations: Intact  Orientation:  Full (Time, Place, and Person)  Thought Content:  WDL and Logical  Suicidal Thoughts:  No  Homicidal Thoughts:  No  Memory:  Immediate;   Good Recent;   Good Remote;   Good  Judgement:  Good  Insight:  Good  Psychomotor Activity:  Normal  Concentration:  Concentration: Good and Attention Span: Good  Recall:  Good  Fund of  Knowledge:  Good  Language:  Good  Akathisia:  No  Handed:  Right  AIMS (if indicated):     Assets:  Housing Leisure Time Physical Health Resilience Social Support  ADL's:  Intact  Cognition:  WNL  Sleep:      Physical Exam: Physical Exam Vitals and nursing note reviewed.  Constitutional:      Appearance: Normal appearance.  HENT:     Head: Normocephalic.     Nose: Nose normal.  Pulmonary:     Effort: Pulmonary effort is normal.  Musculoskeletal:        General: Normal range of motion.     Cervical back: Normal range of motion.  Neurological:     General: No focal deficit present.     Mental Status: He is alert and oriented  to person, place, and time.    Review of Systems  All other systems reviewed and are negative.  Blood pressure 121/77, pulse 71, temperature (!) 97.3 F (36.3 C), resp. rate 16, height 5\' 9"  (1.753 m), weight 83.9 kg, SpO2 94%. Body mass index is 27.32 kg/m.   Social History   Tobacco Use  Smoking Status Never  Smokeless Tobacco Current   Types: Snuff   Tobacco Cessation:  A prescription for an FDA-approved tobacco cessation medication was offered at discharge and the patient refused   Blood Alcohol level:  Lab Results  Component Value Date   ETH 72 (H) 01/08/2023   ETH 206 (H) 09/21/2021    Metabolic Disorder Labs:  Lab Results  Component Value Date   HGBA1C 6.7 (H) 01/13/2023   MPG 145.59 01/13/2023   MPG 251.78 08/26/2021   No results found for: "PROLACTIN" Lab Results  Component Value Date   CHOL 193 01/13/2023   TRIG 396 (H) 01/13/2023   HDL 30 (L) 01/13/2023   CHOLHDL 6.4 01/13/2023   VLDL 79 (H) 01/13/2023   LDLCALC 84 01/13/2023    See Psychiatric Specialty Exam and Suicide Risk Assessment completed by Attending Physician prior to discharge.  Discharge destination:  Home  Is patient on multiple antipsychotic therapies at discharge:  No   Has Patient had three or more failed trials of antipsychotic monotherapy  by history:  No  Recommended Plan for Multiple Antipsychotic Therapies: NA  Discharge Instructions     Diet - low sodium heart healthy   Complete by: As directed    Increase activity slowly   Complete by: As directed       Allergies as of 01/15/2023   No Known Allergies      Medication List     STOP taking these medications    diazepam 10 MG tablet Commonly known as: VALIUM   escitalopram 10 MG tablet Commonly known as: LEXAPRO   HYDROcodone-acetaminophen 5-325 MG tablet Commonly known as: NORCO/VICODIN   hydrOXYzine 25 MG capsule Commonly known as: VISTARIL   phentermine 15 MG capsule       TAKE these medications      Indication  acetaminophen 500 MG tablet Commonly known as: TYLENOL Take 1,000 mg by mouth every 6 (six) hours as needed for moderate pain.  Indication: Pain   ARIPiprazole 5 MG tablet Commonly known as: ABILIFY Take 1 tablet (5 mg total) by mouth daily.  Indication: Major Depressive Disorder   DULoxetine 20 MG capsule Commonly known as: CYMBALTA Take 1 capsule (20 mg total) by mouth daily. Start taking on: January 16, 2023  Indication: Major Depressive Disorder   hydrOXYzine 25 MG tablet Commonly known as: ATARAX Take 1 tablet (25 mg total) by mouth 3 (three) times daily as needed for anxiety.  Indication: Feeling Anxious   insulin lispro 100 UNIT/ML injection Commonly known as: HumaLOG Inject 1-10 Units into the skin 3 (three) times daily before meals. What changed:  how to take this when to take this additional instructions  Indication: Type 1 Diabetes   lamoTRIgine 25 MG tablet Commonly known as: LAMICTAL Take 1 tablet (25 mg total) by mouth daily. Start taking on: January 16, 2023  Indication: mood stabilization   meloxicam 7.5 MG tablet Commonly known as: MOBIC Take 1 tablet (7.5 mg total) by mouth daily.  Indication: Joint Damage causing Pain and Loss of Function   naloxone 4 MG/0.1ML Liqd nasal spray  kit Commonly known as: NARCAN Place 1 spray into  the nose once.  Indication: Opioid Overdose   pregabalin 100 MG capsule Commonly known as: LYRICA Take 100 mg by mouth 3 (three) times daily.  Indication: Neuropathic Pain   traZODone 150 MG tablet Commonly known as: DESYREL Take 1 tablet (150 mg total) by mouth at bedtime.  Indication: Trouble Sleeping         Follow-up recommendations:  Activity:  as tolerated Diet:  heart healthy diet Bipolar affective disorder: Lamictal 25 mg daily Abilify 5 mg daily  Cymbalta 20 mg daily  General anxiety disorder: Hydroxyzine 25 mg TID PRN  Insomnia: Trazodone 150 mg daily at bedtime  Comments:  follow up with resources provided by the social worker  Signed: Nanine Means, NP 01/15/2023, 10:06 AM

## 2023-01-15 NOTE — Progress Notes (Signed)
  [facility] Adult Case Management Discharge Plan :  Will you be returning to the same living situation after discharge:  Yes,  pt reports that he is returning to his home after getting his truck from his parents home. At discharge, do you have transportation home?: Yes,  pt reports that wife will provide transportation.  Do you have the ability to pay for your medications: Yes,  [place] MEDICAID PREPAID HEALTH PLAN / [place] MEDICAID HEALTHY BLUE  Release of information consent forms completed and in the chart;  Patient's signature needed at discharge.  Patient to Follow up at:  Follow-up Information     [facility] Follow up.   Why: Walk in hours are from 8AM to 2PM, Monday, Wednesday and Friday Contact information: [address] [place] [address] [phone]         [facility] Follow up.   Why: Walk in hours are from 8AM to 2PM, Monday, Wednesday and Friday Contact information: [address] [place] [address] [phone]                 Next level of care provider has access to [facility] Link:no  Safety Planning and Suicide Prevention discussed: Yes,  SPE completed with the patient and his wife.      Has patient been referred to the Quitline?: Patient refused referral for treatment  Patient has been referred for addiction treatment: Patient refused referral for treatment.  Harden Mo, LCSW 01/15/2023, 10:51 AM

## 2023-01-28 ENCOUNTER — Encounter: Payer: Self-pay | Admitting: *Deleted

## 2023-01-28 ENCOUNTER — Encounter: Payer: Self-pay | Admitting: Gastroenterology

## 2023-01-28 ENCOUNTER — Other Ambulatory Visit: Payer: Self-pay | Admitting: *Deleted

## 2023-01-28 ENCOUNTER — Ambulatory Visit: Payer: Medicaid Other | Admitting: Gastroenterology

## 2023-01-28 VITALS — BP 147/95 | HR 100 | Temp 98.1°F | Ht 69.0 in | Wt 190.8 lb

## 2023-01-28 DIAGNOSIS — R195 Other fecal abnormalities: Secondary | ICD-10-CM | POA: Diagnosis not present

## 2023-01-28 DIAGNOSIS — K625 Hemorrhage of anus and rectum: Secondary | ICD-10-CM | POA: Insufficient documentation

## 2023-01-28 DIAGNOSIS — R197 Diarrhea, unspecified: Secondary | ICD-10-CM | POA: Insufficient documentation

## 2023-01-28 MED ORDER — PEG 3350-KCL-NA BICARB-NACL 420 G PO SOLR: 4000.0000 mL | Freq: Once | ORAL | 0 refills | Status: AC

## 2023-01-28 NOTE — Patient Instructions (Signed)
Colonoscopy to be scheduled. Please call with any questions or concerns in the interim.

## 2023-01-28 NOTE — Progress Notes (Signed)
GI Office Note    Referring Provider: Roe Rutherford, NP Primary Care Physician:  Roe Rutherford, NP  Primary Gastroenterologist: previously unassigned, Dr. Tasia Catchings  Chief Complaint   Chief Complaint  Patient presents with   Rectal Bleeding    Everytime has BM daily, mixed bright-dark red with mucus. At least 3 times per day. Some constipation but does not straining.     History of Present Illness   Eric [name] is a 37 y.o. male presenting today at the request of Roe Rutherford NP with rectal bleeding.  Labs December 2024: White blood cell count 5900, hemoglobin 15.2, platelets 229,000.  Glucose 169, creatinine 0.08, sodium 141, potassium 4.4, total bilirubin 0.3, alkaline phosphatase 108, AST 19, ALT 31, albumin 4.1, A1c 7.4  Patient reports having intermittent rectal bleeding off and on for more than a year.  With time symptoms have progressively gotten worse.  Sometimes wipes both fresh and dark blood.  A couple of months ago however he noticed stool change.  His stools are all very loose.  He has 2-3 daily.  Some nocturnal diarrhea.  Symptoms associated with abdominal cramping.  Appetite remains normal.  No nausea or vomiting.  No weight loss.  Last antibiotics back in July after shoulder surgery.  No family history of IBD or colon cancer.  Denies any use of NSAIDs or aspirin powders.  Denies any upper GI symptoms.  Medications   Current Outpatient Medications  Medication Sig Dispense Refill   acetaminophen (TYLENOL) 500 MG tablet Take 1,000 mg by mouth every 6 (six) hours as needed for moderate pain.     ARIPiprazole (ABILIFY) 5 MG tablet Take 1 tablet (5 mg total) by mouth daily. 30 tablet 0   DULoxetine (CYMBALTA) 20 MG capsule Take 1 capsule (20 mg total) by mouth daily. 30 capsule 0   hydrOXYzine (ATARAX) 25 MG tablet Take 1 tablet (25 mg total) by mouth 3 (three) times daily as needed for anxiety. 90 tablet 0   insulin lispro (HUMALOG) 100 UNIT/ML injection  Inject 1-10 Units into the skin 3 (three) times daily before meals. (Patient taking differently: 1-10 Units 3 (three) times daily with meals. Based on sliding scale/carb intake-via pump) 10 mL 12   lamoTRIgine (LAMICTAL) 25 MG tablet Take 1 tablet (25 mg total) by mouth daily. 30 tablet 0   naloxone (NARCAN) nasal spray 4 mg/0.1 mL Place 1 spray into the nose once.     pregabalin (LYRICA) 100 MG capsule Take 100 mg by mouth 3 (three) times daily.     traZODone (DESYREL) 150 MG tablet Take 1 tablet (150 mg total) by mouth at bedtime. (Patient taking differently: Take 150 mg by mouth at bedtime. As needed) 30 tablet 0   No current facility-administered medications for this visit.    Allergies   Allergies as of 01/28/2023   (No Known Allergies)    Past Medical History   Past Medical History:  Diagnosis Date   Anxiety    Bipolar 2 disorder, major depressive episode (HCC)    Depression    Diabetes mellitus    Hyperlipidemia    Hypertension    Lumbar facet joint syndrome    Plaque psoriasis     Past Surgical History   Past Surgical History:  Procedure Laterality Date   AMPUTATION Right 05/2020   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   FRACTURE SURGERY     reconstructive left knee surgery   KNEE SURGERY     ROTATOR  CUFF REPAIR Right     Past Family History   Family History  Problem Relation Age of Onset   Hypertension Mother    Hypertension Father    Inflammatory bowel disease Neg Hx    Colon cancer Neg Hx     Past Social History   Social History   Socioeconomic History   Marital status: Married    Spouse name: Not on file   Number of children: Not on file   Years of education: Not on file   Highest education level: Not on file  Occupational History   Not on file  Tobacco Use   Smoking status: Never   Smokeless tobacco: Current    Types: Snuff  Vaping Use   Vaping status: Never Used  Substance and Sexual Activity   Alcohol use: Not Currently   Drug use: Not  Currently   Sexual activity: Not on file  Other Topics Concern   Not on file  Social History Narrative   Not on file   Social Drivers of Health   Financial Resource Strain: Patient Declined (04/05/2022)   Received from [facility], [facility]   Overall Financial Resource Strain (CARDIA)    Difficulty of Paying Living Expenses: Patient declined  Recent Concern: Financial Resource Strain - Medium Risk (02/28/2022)   Received from [facility], [facility]   Overall Financial Resource Strain (CARDIA)    Difficulty of Paying Living Expenses: Somewhat hard  Food Insecurity: No Food Insecurity (01/12/2023)   Hunger Vital Sign    Worried About Running Out of Food in the Last Year: Never true    Ran Out of Food in the Last Year: Never true  Transportation Needs: No Transportation Needs (01/12/2023)   PRAPARE - Administrator, Civil Service (Medical): No    Lack of Transportation (Non-Medical): No  Physical Activity: Sufficiently Active (12/11/2021)   Received from [facility], [facility]   Exercise Vital Sign    Days of Exercise per Week: 2 days    Minutes of Exercise per Session: 120 min  Stress: Patient Declined (12/11/2021)   Received from [facility] Occupational Health - Occupational Stress Questionnaire    Feeling of Stress : Patient declined  Social Connections: Unknown (06/13/2021)   Received from [facility]   Social Network    Social Network: Not on file  Intimate Partner Violence: Not At Risk (01/12/2023)   Humiliation, Afraid, Rape, and Kick questionnaire    Fear of Current or Ex-Partner: No    Emotionally Abused: No    Physically Abused: No    Sexually Abused: No    Review of Systems   General: Negative for anorexia, weight loss, fever, chills, fatigue, weakness. Eyes: Negative for vision changes.  ENT: Negative for hoarseness, difficulty swallowing , nasal  congestion. CV: Negative for chest pain, angina, palpitations, dyspnea on exertion, peripheral edema.  Respiratory: Negative for dyspnea at rest, dyspnea on exertion, cough, sputum, wheezing.  GI: See history of present illness. GU:  Negative for dysuria, hematuria, urinary incontinence, urinary frequency, nocturnal urination.  MS: Negative for joint pain, low back pain.  Derm: Negative for rash or itching.  Neuro: Negative for weakness, abnormal sensation, seizure, frequent headaches, memory loss,  confusion.  Psych: Negative for anxiety, depression, suicidal ideation, hallucinations.  Endo: Negative for unusual weight change.  Heme: Negative for bruising or bleeding. Allergy: Negative for rash or hives.  Physical Exam  BP (!) 147/95   Pulse 100   Temp 98.1 F (36.7 C) (Oral)   Ht 5\' 9"  (1.753 m)   Wt 190 lb 12.8 oz (86.5 kg)   BMI 28.18 kg/m    General: Well-nourished, well-developed in no acute distress.  Head: Normocephalic, atraumatic.   Eyes: Conjunctiva pink, no icterus. Mouth: Oropharyngeal mucosa moist and pink  Neck: Supple without thyromegaly, masses, or lymphadenopathy.  Lungs: Clear to auscultation bilaterally.  Heart: Regular rate and rhythm, no murmurs rubs or gallops.  Abdomen: Bowel sounds are normal, nondistended, no hepatosplenomegaly or masses,  no abdominal bruits or hernia, no rebound or guarding. Mild right mid abd tenderness, beside umbilicus. Rectal: not performed Extremities: No lower extremity edema. No clubbing or deformities.  Neuro: Alert and oriented x 4 , grossly normal neurologically.  Skin: Warm and dry, no rash or jaundice.   Psych: Alert and cooperative, normal mood and affect.  Labs   See hpi  Imaging Studies   No results found.  Assessment/Plan:   Rectal bleeding/loose stools: Inflammatory bowel disease remains high on the differential, less likely malignancy but needs to be ruled out.  Doubt infectious etiology given chronicity  of symptoms. -Colonoscopy in the near future. ASA 2. Consider look at TI if unremarkable colon.  I have discussed the risks, alternatives, benefits with regards to but not limited to the risk of reaction to medication, bleeding, infection, perforation and the patient is agreeable to proceed. Written consent to be obtained.     Leanna Battles. Melvyn Neth, MHS, PA-C [facility]

## 2023-02-01 ENCOUNTER — Encounter (HOSPITAL_COMMUNITY)
Admission: RE | Admit: 2023-02-01 | Discharge: 2023-02-01 | Disposition: A | Discharge status: Home / Self Care | Payer: Medicaid Other | Source: Ambulatory Visit | Attending: Gastroenterology | Admitting: Gastroenterology

## 2023-02-07 ENCOUNTER — Telehealth: Payer: Self-pay | Admitting: *Deleted

## 2023-02-07 NOTE — Telephone Encounter (Signed)
Spoke to pt's wife [place] (on [place]). She states he has been in jail and just got out on Christmas Eve. Message was relayed to her that pt's procedure would be cancelled for tomorrow and he needs to call us back to reschedule. She will tell his sister to tell him regarding cancelling his procedure.

## 2023-02-07 NOTE — Progress Notes (Signed)
Unable to get in touch wit the patient after numerous attempts.  Dr Marcelino Freestone office notified.

## 2023-02-07 NOTE — Telephone Encounter (Signed)
Unable to leave message due to mail box being full.   No answer Received: Today Jethro Bolus, RN  Lorna Few, Tanya, LPN; Darwin, Ewell Poe, CMA We have been unable to get in touch with the patient for the last several days.  Should we take him of the schedule

## 2023-02-08 ENCOUNTER — Encounter (HOSPITAL_COMMUNITY): Admission: RE | Payer: Self-pay | Source: Home / Self Care

## 2023-02-08 ENCOUNTER — Ambulatory Visit (HOSPITAL_COMMUNITY): Admission: RE | Admit: 2023-02-08 | Payer: Medicaid Other | Source: Home / Self Care | Admitting: Gastroenterology

## 2023-02-08 SURGERY — COLONOSCOPY WITH PROPOFOL
Anesthesia: Monitor Anesthesia Care

## 2023-02-10 ENCOUNTER — Other Ambulatory Visit (HOSPITAL_COMMUNITY): Payer: Self-pay | Admitting: Psychiatry

## 2023-02-19 ENCOUNTER — Encounter: Payer: Self-pay | Admitting: *Deleted

## 2023-03-11 ENCOUNTER — Ambulatory Visit (HOSPITAL_COMMUNITY)
Admission: RE | Admit: 2023-03-11 | Discharge: 2023-03-11 | Disposition: A | Discharge status: Home / Self Care | Payer: Medicaid Other | Attending: Gastroenterology | Admitting: Gastroenterology

## 2023-03-11 ENCOUNTER — Ambulatory Visit (HOSPITAL_COMMUNITY): Payer: Medicaid Other | Admitting: Anesthesiology

## 2023-03-11 ENCOUNTER — Other Ambulatory Visit: Payer: Self-pay

## 2023-03-11 ENCOUNTER — Encounter (HOSPITAL_COMMUNITY): Payer: Self-pay | Admitting: Gastroenterology

## 2023-03-11 ENCOUNTER — Encounter (HOSPITAL_COMMUNITY)
Admission: RE | Disposition: A | Discharge status: Home / Self Care | Payer: Self-pay | Source: Home / Self Care | Attending: Gastroenterology

## 2023-03-11 DIAGNOSIS — F3181 Bipolar II disorder: Secondary | ICD-10-CM | POA: Diagnosis not present

## 2023-03-11 DIAGNOSIS — K625 Hemorrhage of anus and rectum: Secondary | ICD-10-CM

## 2023-03-11 DIAGNOSIS — K644 Residual hemorrhoidal skin tags: Secondary | ICD-10-CM | POA: Diagnosis not present

## 2023-03-11 DIAGNOSIS — K5150 Left sided colitis without complications: Secondary | ICD-10-CM

## 2023-03-11 DIAGNOSIS — K648 Other hemorrhoids: Secondary | ICD-10-CM

## 2023-03-11 DIAGNOSIS — R197 Diarrhea, unspecified: Secondary | ICD-10-CM | POA: Diagnosis present

## 2023-03-11 DIAGNOSIS — Z794 Long term (current) use of insulin: Secondary | ICD-10-CM | POA: Diagnosis not present

## 2023-03-11 DIAGNOSIS — G709 Myoneural disorder, unspecified: Secondary | ICD-10-CM | POA: Insufficient documentation

## 2023-03-11 DIAGNOSIS — K529 Noninfective gastroenteritis and colitis, unspecified: Secondary | ICD-10-CM | POA: Diagnosis not present

## 2023-03-11 DIAGNOSIS — I10 Essential (primary) hypertension: Secondary | ICD-10-CM | POA: Diagnosis not present

## 2023-03-11 DIAGNOSIS — K51019 Ulcerative (chronic) pancolitis with unspecified complications: Secondary | ICD-10-CM

## 2023-03-11 DIAGNOSIS — K51511 Left sided colitis with rectal bleeding: Secondary | ICD-10-CM | POA: Diagnosis not present

## 2023-03-11 DIAGNOSIS — E119 Type 2 diabetes mellitus without complications: Secondary | ICD-10-CM | POA: Diagnosis not present

## 2023-03-11 DIAGNOSIS — F419 Anxiety disorder, unspecified: Secondary | ICD-10-CM | POA: Diagnosis not present

## 2023-03-11 HISTORY — PX: COLONOSCOPY WITH PROPOFOL: SHX5780

## 2023-03-11 HISTORY — PX: BIOPSY: SHX5522

## 2023-03-11 LAB — GLUCOSE, CAPILLARY
Glucose-Capillary: 182 mg/dL — ABNORMAL HIGH (ref 70–99)
Glucose-Capillary: 145 mg/dL — ABNORMAL HIGH (ref 70–99)

## 2023-03-11 LAB — HM COLONOSCOPY

## 2023-03-11 SURGERY — COLONOSCOPY WITH PROPOFOL
Anesthesia: General

## 2023-03-11 MED ORDER — MESALAMINE 1.2 G PO TBEC: 4.8000 g | DELAYED_RELEASE_TABLET | Freq: Every day | ORAL | 2 refills | Status: DC

## 2023-03-11 MED ORDER — MESALAMINE 1000 MG RE SUPP: 4000.0000 mg | Freq: Every day | RECTAL | 2 refills | Status: DC

## 2023-03-11 MED ORDER — PROPOFOL 10 MG/ML IV BOLUS
INTRAVENOUS | Status: DC | PRN
  Administered 2023-03-11: 100 mg via INTRAVENOUS
  Administered 2023-03-11 (×2): 50 mg via INTRAVENOUS

## 2023-03-11 MED ORDER — PROPOFOL 500 MG/50ML IV EMUL
INTRAVENOUS | Status: DC | PRN
  Administered 2023-03-11: 150 ug/kg/min via INTRAVENOUS

## 2023-03-11 MED ORDER — LACTATED RINGERS IV SOLN
INTRAVENOUS | Status: DC | PRN
Start: 2023-03-11 — End: 2023-03-11

## 2023-03-11 MED ORDER — LIDOCAINE HCL (CARDIAC) PF 100 MG/5ML IV SOSY
PREFILLED_SYRINGE | INTRAVENOUS | Status: DC | PRN
  Administered 2023-03-11: 50 mg via INTRATRACHEAL

## 2023-03-11 NOTE — H&P (Signed)
Primary Care Physician:  Roe Rutherford, NP Primary Gastroenterologist:  Dr. Tasia Catchings  Pre-Procedure History & Physical: HPI: Eric [name] is a 38 y.o. male presenting for evaluation of  with rectal bleeding with diarrhea . Proceed with ileocolonoscopy    Labs December 2024: White blood cell count 5900, hemoglobin 15.2, platelets 229,000.  Glucose 169, creatinine 0.08, sodium 141, potassium 4.4, total bilirubin 0.3, alkaline phosphatase 108, AST 19, ALT 31, albumin 4.1, A1c 7.4   Patient reports having intermittent rectal bleeding off and on for more than a year.  With time symptoms have progressively gotten worse.  Sometimes wipes both fresh and dark blood.  A couple of months ago however he noticed stool change.  His stools are all very loose.  He has 2-3 daily.  Some nocturnal diarrhea.  Symptoms associated with abdominal cramping.  Appetite remains normal.  No nausea or vomiting.  No weight loss.  Last antibiotics back in July after shoulder surgery.  No family history of IBD or colon cancer.  Denies any use of NSAIDs or aspirin powders.  Denies any upper GI symptoms.  Past Medical History:  Diagnosis Date   Anxiety    Bipolar 2 disorder, major depressive episode (HCC)    Depression    Diabetes mellitus    Hyperlipidemia    Hypertension    Lumbar facet joint syndrome    Plaque psoriasis     Past Surgical History:  Procedure Laterality Date   AMPUTATION Right 05/2020   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   FRACTURE SURGERY     reconstructive left knee surgery   KNEE SURGERY     ROTATOR CUFF REPAIR Right     Prior to Admission medications   Medication Sig Start Date End Date Taking? Authorizing Provider  acetaminophen (TYLENOL) 500 MG tablet Take 1,000 mg by mouth every 6 (six) hours as needed for moderate pain.   Yes [provider]  ALPRAZolam Prudy Feeler) 0.5 MG tablet Take 0.5 mg by mouth 2 (two) times daily.   Yes [provider]  gabapentin (NEURONTIN) 600 MG  tablet Take 600 mg by mouth 3 (three) times daily.   Yes [provider]  hydrOXYzine (ATARAX) 25 MG tablet Take 25 mg by mouth 3 (three) times daily as needed for anxiety.   Yes [provider]  insulin lispro (HUMALOG) 100 UNIT/ML injection Inject 1-10 Units into the skin 3 (three) times daily before meals. Patient taking differently: 1-10 Units 3 (three) times daily with meals. Based on sliding scale/carb intake-via pump 02/03/11  Yes Elsaid, Hind I, MD  naproxen (EC NAPROSYN) 500 MG EC tablet Take 500 mg by mouth 2 (two) times daily with a meal.   Yes [provider]  pregabalin (LYRICA) 100 MG capsule Take 100 mg by mouth 3 (three) times daily. 11/26/22  Yes [provider]  ARIPiprazole (ABILIFY) 5 MG tablet Take 1 tablet (5 mg total) by mouth daily. 01/15/23 02/14/23  Charm Rings, NP  DULoxetine (CYMBALTA) 20 MG capsule Take 1 capsule (20 mg total) by mouth daily. 01/16/23 02/15/23  Charm Rings, NP  lamoTRIgine (LAMICTAL) 25 MG tablet Take 1 tablet (25 mg total) by mouth daily. 01/16/23 02/15/23  Charm Rings, NP  naloxone [facility]) nasal spray 4 mg/0.1 mL Place 1 spray into the nose once. 10/19/22   [provider]  traZODone (DESYREL) 150 MG tablet Take 1 tablet (150 mg total) by mouth at bedtime. Patient taking differently: Take 150 mg by mouth  at bedtime. As needed 01/15/23 02/14/23  Charm Rings, NP    Allergies as of 02/19/2023   (No Known Allergies)    Family History  Problem Relation Age of Onset   Hypertension Mother    Hypertension Father    Inflammatory bowel disease Neg Hx    Colon cancer Neg Hx     Social History   Socioeconomic History   Marital status: Married    Spouse name: Not on file   Number of children: Not on file   Years of education: Not on file   Highest education level: Not on file  Occupational History   Not on file  Tobacco Use   Smoking status: Never   Smokeless tobacco: Current    Types: Snuff   Vaping Use   Vaping status: Never Used  Substance and Sexual Activity   Alcohol use: Not Currently   Drug use: Not Currently   Sexual activity: Not on file  Other Topics Concern   Not on file  Social History Narrative   Not on file   Social Drivers of Health   Financial Resource Strain: Patient Declined (04/05/2022)   Received [facility], [facility]   Overall Financial Resource Strain (CARDIA)    Difficulty of Paying Living Expenses: Patient declined  Recent Concern: Financial Resource Strain - Medium Risk (02/28/2022)   Received from [facility], [facility]   Overall Financial Resource Strain (CARDIA)    Difficulty of Paying Living Expenses: Somewhat hard  Food Insecurity: No Food Insecurity (01/12/2023)   Hunger Vital Sign    Worried About Running Out of Food in the Last Year: Never true    Ran Out of Food in the Last Year: Never true  Transportation Needs: No Transportation Needs (01/12/2023)   PRAPARE - Administrator, Civil Service (Medical): No    Lack of Transportation (Non-Medical): No  Physical Activity: Sufficiently Active (12/11/2021)   Received from [facility], [facility]   Exercise Vital Sign    Days of Exercise per Week: 2 days    Minutes of Exercise per Session: 120 min  Stress: Patient Declined (12/11/2021)   Received from [facility], [facility] of Occupational Health - Occupational Stress Questionnaire    Feeling of Stress : Patient declined  Social Connections: Unknown (06/13/2021)   Received from [facility]   Social Network    Social Network: Not on file  Intimate Partner Violence: Not At Risk (01/12/2023)   Humiliation, Afraid, Rape, and Kick questionnaire    Fear of Current or Ex-Partner: No    Emotionally Abused: No    Physically Abused: No    Sexually Abused: No    Review of Systems: See HPI, otherwise negative ROS  Physical Exam: Vital signs in last  24 hours:     General:   Alert,  Well-developed, well-nourished, pleasant and cooperative in NAD Head:  Normocephalic and atraumatic. Eyes:  Sclera clear, no icterus.   Conjunctiva pink. Ears:  Normal auditory acuity. Nose:  No deformity, discharge,  or lesions. Msk:  Symmetrical without gross deformities. Normal posture. Extremities:  Without clubbing or edema. Neurologic:  Alert and  oriented x4;  grossly normal neurologically. Skin:  Intact without significant lesions or rashes. Psych:  Alert and cooperative. Normal mood and affect.  Impression/Plan: Eric [name] is a 38 y.o. male presenting for evaluation of  with rectal bleeding with diarrhea . Proceed with ileocolonoscopy  The risks of the procedure including infection, bleed, or perforation as well as benefits, limitations, alternatives and imponderables have been reviewed with the patient. Questions have been answered. All parties agreeable.

## 2023-03-11 NOTE — Anesthesia Postprocedure Evaluation (Signed)
Anesthesia Post Note  Patient: Eric [name]  Procedure(s) Performed: COLONOSCOPY WITH PROPOFOL BIOPSY  Patient location during evaluation: Endoscopy Anesthesia Type: General Level of consciousness: awake and alert Pain management: pain level controlled Vital Signs Assessment: post-procedure vital signs reviewed and stable Respiratory status: spontaneous breathing Cardiovascular status: blood pressure returned to baseline and stable Postop Assessment: no apparent nausea or vomiting Anesthetic complications: no   No notable events documented.   Last Vitals:  Vitals:   03/11/23 0825 03/11/23 0944  BP: (!) 170/98 127/83  Pulse: 88 88  Resp: 18 12  Temp: 36.5 C 36.7 C  SpO2: 98% 100%    Last Pain:  Vitals:   03/11/23 0944  TempSrc: Oral  PainSc: 7                  Shaunette Gassner

## 2023-03-11 NOTE — Transfer of Care (Signed)
Immediate Anesthesia Transfer of Care Note  Patient: Eric [name]  Procedure(s) Performed: COLONOSCOPY WITH PROPOFOL BIOPSY  Patient Location: Endoscopy Unit  Anesthesia Type:General  Level of Consciousness: awake  Airway & Oxygen Therapy: Patient Spontanous Breathing  Post-op Assessment: Report given to RN  Post vital signs: Reviewed and stable  Last Vitals:  Vitals Value Taken Time  BP    Temp    Pulse    Resp    SpO2      Last Pain:  Vitals:   03/11/23 0900  TempSrc:   PainSc: 0-No pain      Patients Stated Pain Goal: 6 (03/11/23 0825)  Complications: No notable events documented.

## 2023-03-11 NOTE — Anesthesia Preprocedure Evaluation (Signed)
Anesthesia Evaluation  Patient identified by MRN, date of birth, ID band Patient awake    Reviewed: Allergy & Precautions, H&P , NPO status , Patient's Chart, lab work & pertinent test results, reviewed documented beta blocker date and time   Airway Mallampati: II  TM Distance: >3 FB Neck ROM: full    Dental no notable dental hx.    Pulmonary neg pulmonary ROS   Pulmonary exam normal breath sounds clear to auscultation       Cardiovascular Exercise Tolerance: Good hypertension, negative cardio ROS  Rhythm:regular Rate:Normal     Neuro/Psych  PSYCHIATRIC DISORDERS Anxiety Depression Bipolar Disorder    Neuromuscular disease negative neurological ROS  negative psych ROS   GI/Hepatic negative GI ROS, Neg liver ROS,,,  Endo/Other  negative endocrine ROSdiabetes    Renal/GU Renal diseasenegative Renal ROS  negative genitourinary   Musculoskeletal   Abdominal   Peds  Hematology negative hematology ROS (+)   Anesthesia Other Findings   Reproductive/Obstetrics negative OB ROS                             Anesthesia Physical Anesthesia Plan  ASA: 2  Anesthesia Plan: General   Post-op Pain Management:    Induction:   PONV Risk Score and Plan: Propofol infusion  Airway Management Planned:   Additional Equipment:   Intra-op Plan:   Post-operative Plan:   Informed Consent: I have reviewed the patients History and Physical, chart, labs and discussed the procedure including the risks, benefits and alternatives for the proposed anesthesia with the patient or authorized representative who has indicated his/her understanding and acceptance.     Dental Advisory Given  Plan Discussed with: CRNA  Anesthesia Plan Comments:        Anesthesia Quick Evaluation

## 2023-03-11 NOTE — Op Note (Addendum)
[facility] Patient Name: Eric [name] Procedure Date: 03/11/2023 8:56 AM MRN: [id number] Date of Birth: 1985/11/08 Attending MD: Sanjuan Dame , MD, [id number] CSN: [id number] Age: 38 Admit Type: Outpatient Procedure:                Colonoscopy Indications:              Chronic diarrhea, Hematochezia Providers:                Sanjuan Dame, MD, Angelica Ran, Pandora Leiter,                            Technician Referring MD:              Medicines:                Monitored Anesthesia Care Complications:            No immediate complications. Estimated Blood Loss:     Estimated blood loss was minimal. Procedure:                Pre-Anesthesia Assessment:                           - Prior to the procedure, a History and Physical                            was performed, and patient medications and                            allergies were reviewed. The patient's tolerance of                            previous anesthesia was also reviewed. The risks                            and benefits of the procedure and the sedation                            options and risks were discussed with the patient.                            All questions were answered, and informed consent                            was obtained. Prior Anticoagulants: The patient has                            taken no anticoagulant or antiplatelet agents. ASA                            Grade Assessment: II - A patient with mild systemic                            disease. After reviewing the risks and benefits,  the patient was deemed in satisfactory condition to                            undergo the procedure.                           After obtaining informed consent, the colonoscope                            was passed under direct vision. Throughout the                            procedure, the patient's blood pressure, pulse, and                            oxygen saturations  were monitored continuously. The                            [id number]) scope was introduced through the                            anus and advanced to the 15 cm into the ileum. The                            colonoscopy was performed without difficulty. The                            patient tolerated the procedure well. The quality                            of the bowel preparation was evaluated using the                            BBPS [facility] Bowel Preparation Scale) with scores                            of: Right Colon = 2 (minor amount of residual                            staining, small fragments of stool and/or opaque                            liquid, but mucosa seen well), Transverse Colon = 2                            (minor amount of residual staining, small fragments                            of stool and/or opaque liquid, but mucosa seen                            well) and Left Colon = 2 (minor amount of residual  staining, small fragments of stool and/or opaque                            liquid, but mucosa seen well). The total BBPS score                            equals 6. The quality of the bowel preparation was                            fair. Scope In: 9:05:56 AM Scope Out: 9:39:42 AM Scope Withdrawal Time: 0 hours 24 minutes 3 seconds  Total Procedure Duration: 0 hours 33 minutes 46 seconds  Findings:      The perianal and digital rectal examinations were normal.      Inflammation characterized by congestion (edema), erosions, erythema,       granularity, loss of vascularity and mucus was found in a continuous and       circumferential pattern from the anus to the transverse colon. The       proximal transverse colon, the ascending colon and the cecum were       spared. The inflammation was graded as Mayo Score 2 (moderate, with       marked erythema, absent vascular pattern, friability, erosions), and       when compared to  previous examinations, the findings are new. Multiple       biopsies were obtained in the rectum, in the sigmoid colon, in the       transverse colon, in the ascending colon and in the cecum with cold       forceps for histology.      The terminal ileum appeared normal. Biopsies were taken with a cold       forceps for histology.      Retroflexion in the rectum was not performed.      Non-bleeding external and internal hemorrhoids were found. Impression:               - Preparation of the colon was fair.                           - Left-sided ulcerative colitis suspected.                            Inflammation was found from the anus to the                            transverse colon. This was graded as Mayo Score 2                            (moderate disease)                           - The examined portion of the ileum was normal.                            Biopsied.                           - Non-bleeding external and internal  hemorrhoids.                           - Multiple biopsies were obtained in the rectum, in                            the sigmoid colon, in the transverse colon, in the                            ascending colon and in the cecum. Moderate Sedation:      Per Anesthesia Care Recommendation:           - Patient has a contact number available for                            emergencies. The signs and symptoms of potential                            delayed complications were discussed with the                            patient. Return to normal activities tomorrow.                            Written discharge instructions were provided to the                            patient.                           - Resume previous diet.                           - Continue present medications.                           - Await pathology results.                           - Repeat colonoscopy in 6 months for surveillance                            after treatment .                            - Return to GI clinic at appointment to be                            scheduled.                           -Check Quantiferon and hepatitis serologies(Hep A                            IgG and Total, Hep B sAb, sAg, core Ab, Hep C Ab)                           -  This appears to be new diagnsosis of IBD ( UC),                            Given moderate to severe disease and concomitant                            immune conditions (extensive psoriasis) would be a                            good candidate for IL-23/TNF-A antagonist                           -Start PO Mesalamine 4.8gm daily and Rectal                            mesalamine(Rowasa 4gm)                           -Update vaccinations against Hep A and Hep B,                            pneumonia, shingles, flu and COVID.                           -Dermatology referral Procedure Code(s):        --- Professional ---                           [phone], Colonoscopy, flexible; with biopsy, single                            or multiple Diagnosis Code(s):        --- Professional ---                           K64.8, Other hemorrhoids                           K51.50, Left sided colitis without complications                           K52.9, Noninfective gastroenteritis and colitis,                            unspecified                           K92.1, Melena (includes Hematochezia) CPT copyright 2022 American Medical Association. All rights reserved. The codes documented in this report are preliminary and upon coder review may  be revised to meet current compliance requirements. Sanjuan Dame, MD Sanjuan Dame, MD 03/11/2023 9:52:47 AM This report has been signed electronically. Number of Addenda: 0

## 2023-03-12 ENCOUNTER — Encounter (HOSPITAL_COMMUNITY): Payer: Self-pay | Admitting: Gastroenterology

## 2023-03-12 ENCOUNTER — Telehealth (INDEPENDENT_AMBULATORY_CARE_PROVIDER_SITE_OTHER): Payer: Self-pay | Admitting: *Deleted

## 2023-03-12 LAB — SURGICAL PATHOLOGY

## 2023-03-12 NOTE — Telephone Encounter (Signed)
Referral sent, they will contact patient with apt

## 2023-03-12 NOTE — Progress Notes (Signed)
I reviewed the pathology results. [name], can you send her a letter with the findings as described below please? Repeat colonoscopy in 7 months  Thanks,  Vista Lawman, MD Gastroenterology and Hepatology [facility]  ---------------------------------------------------------------------------------------------  [facility] [address], [address], [place], [place] [address] Phone:  [phone]   03/12/23 Eric [name], [place]   Dear [name],  I am writing to inform you that the biopsies taken during your recent endoscopic examination showed:  As discussed with you , biopsies do suggest you have diagnosis of Ulcerative colitis   For now recommend starting mesalamine orally and rectal and follow up in the GI Clinic with me .  Also I have referred you to the dermatologist we can take care of your Inflammatory bowel disease and psoriasis together  Also I value your feedback , so if you get a survey , please take the time to fill it out and thank you for choosing [facility]  Please call us at [phone] if you have persistent problems or have questions about your condition that have not been fully answered at this time.  Sincerely,  Vista Lawman, MD Gastroenterology and Hepatology  ================

## 2023-03-12 NOTE — Telephone Encounter (Signed)
-----   Message from Franky Macho sent at 03/11/2023 10:08 AM EST ----- Regarding: refr Hi Eric [name]  Can we please refer this patient to dermatologist   Diagnosis : Psoriasis and new IBD

## 2023-03-13 ENCOUNTER — Encounter (INDEPENDENT_AMBULATORY_CARE_PROVIDER_SITE_OTHER): Payer: Self-pay | Admitting: *Deleted

## 2023-03-20 ENCOUNTER — Other Ambulatory Visit (HOSPITAL_COMMUNITY)
Admission: RE | Admit: 2023-03-20 | Discharge: 2023-03-20 | Disposition: A | Discharge status: Home / Self Care | Payer: Medicaid Other | Source: Ambulatory Visit | Attending: Gastroenterology | Admitting: Gastroenterology

## 2023-03-20 DIAGNOSIS — K51019 Ulcerative (chronic) pancolitis with unspecified complications: Secondary | ICD-10-CM | POA: Diagnosis present

## 2023-03-20 LAB — COMPREHENSIVE METABOLIC PANEL
ALT: 37 U/L (ref 0–44)
AST: 18 U/L (ref 15–41)
Albumin: 4.2 g/dL (ref 3.5–5.0)
Alkaline Phosphatase: 81 U/L (ref 38–126)
Anion gap: 9 (ref 5–15)
BUN: 25 mg/dL — ABNORMAL HIGH (ref 6–20)
CO2: 24 mmol/L (ref 22–32)
Calcium: 9.5 mg/dL (ref 8.9–10.3)
Chloride: 103 mmol/L (ref 98–111)
Creatinine, Ser: 0.95 mg/dL (ref 0.61–1.24)
GFR, Estimated: >60 mL/min (ref >60)
Glucose, Bld: 158 mg/dL — ABNORMAL HIGH (ref 70–99)
Potassium: 4.7 mmol/L (ref 3.5–5.1)
Sodium: 136 mmol/L (ref 135–145)
Total Bilirubin: 1.1 mg/dL (ref 0.0–1.2)
Total Protein: 7.2 g/dL (ref 6.5–8.1)

## 2023-03-20 LAB — CBC
HCT: 45.2 % (ref 39.0–52.0)
Hemoglobin: 15.5 g/dL (ref 13.0–17.0)
MCH: 28.5 pg (ref 26.0–34.0)
MCHC: 34.3 g/dL (ref 30.0–36.0)
MCV: 83.2 fL (ref 80.0–100.0)
Platelets: 210 10*3/uL (ref 150–400)
RBC: 5.43 MIL/uL (ref 4.22–5.81)
RDW: 12.1 % (ref 11.5–15.5)
WBC: 6.9 10*3/uL (ref 4.0–10.5)
nRBC: 0 % (ref 0.0–0.2)

## 2023-03-20 LAB — HEPATITIS B SURFACE ANTIGEN: Hepatitis B Surface Ag: NONREACTIVE

## 2023-03-20 LAB — VITAMIN B12: Vitamin B-12: 218 pg/mL (ref 180–914)

## 2023-03-20 LAB — VITAMIN D 25 HYDROXY (VIT D DEFICIENCY, FRACTURES): Vit D, 25-Hydroxy: 19.27 ng/mL — ABNORMAL LOW (ref 30–100)

## 2023-03-20 LAB — FERRITIN: Ferritin: 44 ng/mL (ref 24–336)

## 2023-03-20 LAB — HEPATITIS B SURFACE ANTIBODY,QUALITATIVE: Hep B S Ab: NONREACTIVE

## 2023-03-20 LAB — HEPATITIS C ANTIBODY: HCV Ab: NONREACTIVE

## 2023-03-20 LAB — C-REACTIVE PROTEIN: CRP: 0.7 mg/dL (ref <1.0)

## 2023-03-20 LAB — HEPATITIS A ANTIBODY, TOTAL: hep A Total Ab: NONREACTIVE

## 2023-03-21 ENCOUNTER — Other Ambulatory Visit (HOSPITAL_COMMUNITY)
Admission: RE | Admit: 2023-03-21 | Discharge: 2023-03-21 | Disposition: A | Discharge status: Home / Self Care | Payer: Medicaid Other | Source: Ambulatory Visit | Attending: Gastroenterology | Admitting: Gastroenterology

## 2023-03-21 DIAGNOSIS — K51019 Ulcerative (chronic) pancolitis with unspecified complications: Secondary | ICD-10-CM | POA: Diagnosis present

## 2023-03-21 LAB — HEPATITIS B CORE ANTIBODY, TOTAL: HEP B CORE AB: NEGATIVE

## 2023-03-24 LAB — CALPROTECTIN, FECAL: Calprotectin, Fecal: 104 ug/g (ref 0–120)

## 2023-03-25 LAB — QUANTIFERON-TB GOLD PLUS (RQFGPL)
QuantiFERON Mitogen Value: >10 [IU]/mL
QuantiFERON Nil Value: 0.05 [IU]/mL
QuantiFERON TB1 Ag Value: 0.04 [IU]/mL
QuantiFERON TB2 Ag Value: 0.05 [IU]/mL

## 2023-03-25 LAB — QUANTIFERON-TB GOLD PLUS: QuantiFERON-TB Gold Plus: NEGATIVE

## 2023-04-01 ENCOUNTER — Encounter (INDEPENDENT_AMBULATORY_CARE_PROVIDER_SITE_OTHER): Payer: Self-pay | Admitting: Gastroenterology

## 2023-04-01 NOTE — Progress Notes (Signed)
 Vitamin D  19 low  Normal liver enzymes  Hep A nonimmune Hep B surface antigen negative.  Hep B core antibody negative.  Hep B surface antibody negative (nonexposed nonimmune) Hep C negative QuantiFERON negative  Ferritin 44 CRP less than 0.7 Vitamin B 12-18 Fecal calprotectin 104 ( normal )

## 2023-04-24 ENCOUNTER — Ambulatory Visit: Payer: Medicaid Other | Admitting: Gastroenterology

## 2023-04-24 ENCOUNTER — Other Ambulatory Visit: Payer: Self-pay | Admitting: *Deleted

## 2023-04-24 ENCOUNTER — Encounter: Payer: Self-pay | Admitting: Gastroenterology

## 2023-04-24 ENCOUNTER — Telehealth: Payer: Self-pay | Admitting: *Deleted

## 2023-04-24 VITALS — BP 158/102 | HR 99 | Temp 98.4°F | Ht 69.0 in | Wt 197.0 lb

## 2023-04-24 DIAGNOSIS — E559 Vitamin D deficiency, unspecified: Secondary | ICD-10-CM

## 2023-04-24 DIAGNOSIS — L409 Psoriasis, unspecified: Secondary | ICD-10-CM

## 2023-04-24 DIAGNOSIS — K5190 Ulcerative colitis, unspecified, without complications: Secondary | ICD-10-CM | POA: Insufficient documentation

## 2023-04-24 DIAGNOSIS — K5150 Left sided colitis without complications: Secondary | ICD-10-CM

## 2023-04-24 MED ORDER — ONDANSETRON HCL 4 MG PO TABS: 4.0000 mg | ORAL_TABLET | Freq: Four times a day (QID) | ORAL | 1 refills | Status: DC | PRN

## 2023-04-24 NOTE — Telephone Encounter (Signed)
 referral

## 2023-04-24 NOTE — Progress Notes (Signed)
 GI Office Note    Referring Provider: Roe Rutherford, NP Primary Care Physician:  Roe Rutherford, NP  Primary Gastroenterologist: Dr. Tasia Catchings  Chief Complaint   Chief Complaint  Patient presents with   Follow-up    States that the medication has made him nauseous    History of Present Illness   Eric [name] is a 38 y.o. male presenting today  for follow up. Last seen 01/2023. H/o rectal bleeding/loose stools.   Labs 03/2023:  fecal calprotectin 104. Hep A nonimmune. Hep B surf antigen neg. Hep B core Ab neg. Hep B surf Ab neg. HCV neg. TB gold neg. Ferritin 44. CRP <7. B12 218. Ferritin 44. Cre 0.95. LFTs normal. Vit D 19.27. CBC normal.  Today: states he saw dermatology 1 year ago, has diagnosis of psoriasis. Prescribed cream in the past. Wearing an ankle monitor. Presents with his mother. Having nausea which he feels is do to mesalamine. Worse in the evenings. He takes mesalamine four tablets in morning and four suppositories in the evenings. Having BM once daily. Less blood in the stool. Stools are solid. Denies abdominal pain. No fever. He is interested in medication that could treat both UC and cover for his psoriasis.   Colonoscopy 02/2023: -preparation of colon is fair -left-sided UC suspected. Inflammation found from anus to transverese colon.  -examined portion of ileum was normal s/p bx, normal -non-bleeding external and internal hemorrhoids -multiple biopsies were obtained in rectum, sigmoid colon, transverse colon, ascending colon and in cecum, consistent with left sided UC -repeat colonoscopy in six monts for survillance after treatment -update vaccinations against Hep A/Hep B, PNA, shingles, flu, covid -dermatology referral for rash -given moderat to severe disase and concomitant immune conditions (extensive sporiasis) would be good candidate for IL-23/TNF-A antagonist.  -start PO mesalamine 4.8g daily and rectal mesalamine   Medications   Current  Outpatient Medications  Medication Sig Dispense Refill   ALPRAZolam (XANAX) 0.5 MG tablet Take 0.5 mg by mouth 2 (two) times daily.     ARIPiprazole (ABILIFY) 5 MG tablet Take 1 tablet (5 mg total) by mouth daily. 30 tablet 0   Continuous Glucose Sensor (DEXCOM G7 SENSOR) MISC USE TO MONITOR BLOOD SUGAR CONTINUOUSLY. CHANGE SENSOR EVERY 10 DAYS.     DULoxetine (CYMBALTA) 20 MG capsule Take 1 capsule (20 mg total) by mouth daily. 30 capsule 0   HYDROcodone-acetaminophen (NORCO/VICODIN) 5-325 MG tablet Take 1 tablet by mouth 3 (three) times daily as needed.     insulin lispro (HUMALOG) 100 UNIT/ML injection Inject 1-10 Units into the skin 3 (three) times daily before meals. (Patient taking differently: 1-10 Units 3 (three) times daily with meals. Based on sliding scale/carb intake-via pump) 10 mL 12   lamoTRIgine (LAMICTAL) 25 MG tablet Take 1 tablet (25 mg total) by mouth daily. 30 tablet 0   lisinopril (ZESTRIL) 10 MG tablet Take by mouth.     mesalamine (CANASA) 1000 MG suppository Place 4 suppositories (4,000 mg total) rectally at bedtime. 120 suppository 2   mesalamine (LIALDA) 1.2 g EC tablet Take 4 tablets (4.8 g total) by mouth daily with breakfast. 120 tablet 2   naloxone (NARCAN) nasal spray 4 mg/0.1 mL Place 1 spray into the nose once.     pregabalin (LYRICA) 100 MG capsule Take 100 mg by mouth 3 (three) times daily.     traZODone (DESYREL) 150 MG tablet Take 1 tablet (150 mg total) by mouth at bedtime. (Patient taking differently: Take 150 mg  by mouth at bedtime. As needed) 30 tablet 0   No current facility-administered medications for this visit.    Allergies   Allergies as of 04/24/2023   (No Known Allergies)      Review of Systems   General: Negative for anorexia, weight loss, fever, chills, fatigue, weakness. ENT: Negative for hoarseness, difficulty swallowing , nasal congestion. CV: Negative for chest pain, angina, palpitations, dyspnea on exertion, peripheral edema.   Respiratory: Negative for dyspnea at rest, dyspnea on exertion, cough, sputum, wheezing.  GI: See history of present illness. GU:  Negative for dysuria, hematuria, urinary incontinence, urinary frequency, nocturnal urination.  Endo: Negative for unusual weight change.     Physical Exam   BP (!) 158/102 (BP Location: Right Arm, Patient Position: Sitting, Cuff Size: Large)   Pulse 99   Temp 98.4 F (36.9 C) (Oral)   Ht 5\' 9"  (1.753 m)   Wt 197 lb (89.4 kg)   SpO2 97%   BMI 29.09 kg/m    General: Well-nourished, well-developed in no acute distress.  Eyes: No icterus. Mouth: Oropharyngeal mucosa moist and pink   Abdomen: Bowel sounds are normal, nontender, nondistended, no hepatosplenomegaly or masses,  no abdominal bruits or hernia , no rebound or guarding.  Rectal: deferred Extremities: No lower extremity edema. No clubbing or deformities. Neuro: Alert and oriented x 4   Skin: Warm and dry, no jaundice.   Psych: Alert and cooperative, normal mood and affect.  Labs   See pi Imaging Studies   No results found.  Assessment/Plan:   Left sided ulcerative colitis: newly diagnosed 02/2023 -taking mesalamine 4.8g orally in AM and 4g rectally in the evenings. Complaining of new nausea since starting medication -split oral mesalamine dose to 2.4g BID. -RX of zofran to use prn. -plans to pursue Cristy Folks if approved by insurance.  -encouraged him to see dermatology years to screen for skin cancers, referral made  Vit D deficiency: -vit D 4000 international units daily    Leanna Battles. Melvyn Neth, MHS, PA-C [facility]

## 2023-04-24 NOTE — Patient Instructions (Addendum)
 Try splitting up your mesalamine tablets to two in the morning and two in the evening to see if this helps with your nausea.  I have sent in Zofran to take one every 6 hours as needed for nausea.  We will work on starting injectable medication for UC and psoriasis. Preferably we will start Skyrizi (risankizumab) if insurance covers.  You will need to see dermatology once per year at minimum to be screened for skin cancers. We will make referral.

## 2023-04-24 NOTE — Telephone Encounter (Signed)
 error

## 2023-04-24 NOTE — Progress Notes (Signed)
 Marland Kitchen

## 2023-04-29 ENCOUNTER — Telehealth: Payer: Self-pay | Admitting: Gastroenterology

## 2023-04-29 DIAGNOSIS — K5190 Ulcerative colitis, unspecified, without complications: Secondary | ICD-10-CM

## 2023-04-29 DIAGNOSIS — Z79899 Other long term (current) drug therapy: Secondary | ICD-10-CM

## 2023-04-29 NOTE — Telephone Encounter (Signed)
 Pt was made aware and verbalized understanding. You will need to send the prescription to [facility] and they will take it from there. Pharmacy has been added to the pt's list of pharmacies.

## 2023-04-29 NOTE — Telephone Encounter (Signed)
 [name],  We need to initiate getting patient on Skyrizi.  Also need to advise him to take OTC Vit D3 4000 international units daily.

## 2023-04-29 NOTE — Progress Notes (Signed)
 Marland Kitchen

## 2023-05-03 ENCOUNTER — Other Ambulatory Visit: Payer: Self-pay

## 2023-05-03 NOTE — Telephone Encounter (Signed)
 Start [employer]. Please make sure we get patient enrolled with skyrizi complete support program.    Cristy Folks IV induction: Vital Care [place] 1200mg  IV at week 0,4,8   Skyrizi [place] injection: Start Skyrizi 180mg  at week 12, and continue every 8 weeks. He will need to be trained by nurse ambassador or by [place] on how to use the body injector.     -->Needs immunity testing before starting [employer]. Please arrange for MMR immunity, Varicella zoster IgG.       Will need LFTs one week after first infusion. We will test periodically throughout his first 12 weeks.

## 2023-05-03 NOTE — Addendum Note (Signed)
 Addended by: Tiffany Kocher on: 05/03/2023 11:39 AM   Modules accepted: Orders

## 2023-05-06 NOTE — Telephone Encounter (Signed)
 Pt is needing the lab orders sent to [facility] to have done due to a bill owed at [facility].

## 2023-05-06 NOTE — Telephone Encounter (Signed)
 You can just fax orders I never put them in under hospital lab because if he doesn't go same day of orders they cancel them.

## 2023-05-07 ENCOUNTER — Other Ambulatory Visit (HOSPITAL_COMMUNITY)
Admission: RE | Admit: 2023-05-07 | Discharge: 2023-05-07 | Disposition: A | Discharge status: Home / Self Care | Source: Ambulatory Visit | Attending: Gastroenterology | Admitting: Gastroenterology

## 2023-05-07 DIAGNOSIS — K5190 Ulcerative colitis, unspecified, without complications: Secondary | ICD-10-CM | POA: Diagnosis present

## 2023-05-07 DIAGNOSIS — Z79899 Other long term (current) drug therapy: Secondary | ICD-10-CM | POA: Insufficient documentation

## 2023-05-07 NOTE — Telephone Encounter (Signed)
Orders have been faxed to [facility] lab.

## 2023-05-08 LAB — MISC LABCORP TEST (SEND OUT)
Labcorp test code: 58495
Labcorp test code: 96776

## 2023-05-08 LAB — VARICELLA ZOSTER ANTIBODY, IGM: Varicella-Zoster Ab, IgM: <0.91 {index} (ref 0.00–0.90)

## 2023-05-08 LAB — VARICELLA ZOSTER ANTIBODY, IGG: Varicella IgG: REACTIVE

## 2023-05-14 MED ORDER — HUMIRA (2 PEN) 40 MG/0.4ML ~~LOC~~ AJKT
AUTO-INJECTOR | SUBCUTANEOUS | 5 refills | Status: DC
Start: 2023-05-14 — End: 2023-05-15

## 2023-05-14 MED ORDER — HUMIRA-CD/UC/HS STARTER 80 MG/0.8ML ~~LOC~~ AJKT: AUTO-INJECTOR | SUBCUTANEOUS | 0 refills | Status: DC

## 2023-05-14 NOTE — Addendum Note (Signed)
 Addended by: Tiffany Kocher on: 05/14/2023 01:57 PM   Modules accepted: Orders

## 2023-05-14 NOTE — Telephone Encounter (Signed)
 Per Ozella Almond not covered, must try Humira first.   Will start Humira 160mg  [place] on day 1, then 80mg  [place] on day 15, then 40mg  [place] every 2 weeks starting on day 29.   RX sent to [facility].   Tammy, it did flag that PA will be needed. Can we make OV for 6 week follow up.  Please let patient know insurance requiring Humira first.  Please make sure he is enrolled in [facility] Nurse Ambassador program.

## 2023-05-15 ENCOUNTER — Other Ambulatory Visit: Payer: Self-pay

## 2023-05-15 MED ORDER — HUMIRA (2 PEN) 40 MG/0.4ML ~~LOC~~ AJKT: AUTO-INJECTOR | SUBCUTANEOUS | 5 refills | Status: DC

## 2023-05-15 MED ORDER — HUMIRA-CD/UC/HS STARTER 80 MG/0.8ML ~~LOC~~ AJKT: AUTO-INJECTOR | SUBCUTANEOUS | 0 refills | Status: DC

## 2023-05-15 NOTE — Telephone Encounter (Signed)
 Spoke to [employer] from [facility] and rx had not been received. Resent to [facility] pharmacy.

## 2023-05-20 NOTE — Telephone Encounter (Signed)
 noted

## 2023-05-20 NOTE — Telephone Encounter (Signed)
 [facility] sent email stating that the pt's humira was approved and they will be arranging shipment shortly.

## 2023-06-26 ENCOUNTER — Ambulatory Visit: Admitting: Gastroenterology

## 2023-07-17 ENCOUNTER — Ambulatory Visit: Admitting: Gastroenterology

## 2023-07-18 ENCOUNTER — Encounter: Payer: Self-pay | Admitting: Gastroenterology

## 2023-08-30 ENCOUNTER — Encounter (INDEPENDENT_AMBULATORY_CARE_PROVIDER_SITE_OTHER): Payer: Self-pay | Admitting: *Deleted

## 2023-09-26 ENCOUNTER — Telehealth: Payer: Self-pay

## 2023-09-26 NOTE — Telephone Encounter (Signed)
 Who is your primary care physician: [facility]  Reasons for the colonoscopy: screening  Have you had a colonoscopy before?  no  Do you have family history of colon cancer? no  Previous colonoscopy with polyps removed? no  Do you have a history colorectal cancer?   no  Are you diabetic? If yes, Type 1 or Type 2?    Yes type 1  Do you have a prosthetic or mechanical heart valve? no  Do you have a pacemaker/defibrillator?   no  Have you had endocarditis/atrial fibrillation? no  Have you had joint replacement within the last 12 months?  no  Do you tend to be constipated or have to use laxatives? no  Do you have any history of drugs or alchohol?  no  Do you use supplemental oxygen?  no  Have you had a stroke or heart attack within the last 6 months? no  Do you take weight loss medication?  no     Do you take any blood-thinning medications such as: (aspirin, warfarin, Plavix, Aggrenox)  no  If yes we need the name, milligram, dosage and who is prescribing doctor  Current Outpatient Medications on File Prior to Visit  Medication Sig Dispense Refill   insulin lispro (HUMALOG) 100 UNIT/ML injection Inject 1-10 Units into the skin 3 (three) times daily before meals. 10 mL 12   No current facility-administered medications on file prior to visit.    No Known Allergies   Pharmacy: [facility]  Primary Insurance Name: [facility]  Best number where you can be reached: [phone]

## 2023-09-26 NOTE — Telephone Encounter (Signed)
 Hold on scheduling this colonoscopy until seen in clinic to assess medication complaince and symptoms  Eric [name] please confirm appointment on 9/17

## 2023-10-30 ENCOUNTER — Encounter: Payer: Self-pay | Admitting: Gastroenterology

## 2023-10-30 ENCOUNTER — Ambulatory Visit: Admitting: Gastroenterology

## 2023-10-30 VITALS — BP 138/87 | HR 87 | Temp 99.1°F | Ht 69.0 in | Wt 189.6 lb

## 2023-10-30 DIAGNOSIS — L409 Psoriasis, unspecified: Secondary | ICD-10-CM | POA: Diagnosis not present

## 2023-10-30 DIAGNOSIS — E559 Vitamin D deficiency, unspecified: Secondary | ICD-10-CM | POA: Insufficient documentation

## 2023-10-30 DIAGNOSIS — K5190 Ulcerative colitis, unspecified, without complications: Secondary | ICD-10-CM | POA: Diagnosis not present

## 2023-10-30 NOTE — Progress Notes (Signed)
 GI Office Note    Referring Provider: Suanne Pfeiffer, NP Primary Care Physician:  Suanne Pfeiffer, NP  Primary Gastroenterologist: Dr. Cinderella  Chief Complaint   Chief Complaint  Patient presents with   Follow-up    Doing well, no issues to discuss    History of Present Illness   Eric [name] is a 38 y.o. male presenting today for follow up. Seen in office in 04/2023 for rectal bleeding/loose stools.   He was diagnosed with left sided UC in 02/2023. Initially on mesalamine  orally and topically but having issues with nausea. Also with psoriasis diagnosed by dermatologist, prescribed cream which was not effective. Planned for [employer] to cover both UC and psoriasis but his insurance required trying Humira  first.   Discussed the use of AI scribe software for clinical note transcription with the patient, who gave verbal consent to proceed.   He experiences intermittent rectal bleeding approximately once a week, with blood noted on the tissue rather than in the stool or water. The bleeding is minimal. His stools are formed, and he has about one bowel movement per day. He notes a decrease in abdominal distension and reports a good appetite. Previously experienced nausea has improved since discontinuing mesalamine .  He has been on Humira  since late April or early May, completed induction and now taking 40mg  administered every two weeks. His psoriasis has improved, with the exception of his worse area, on the anterior lower leg. It is better but not much. The other areas have resolved.    He is not currently taking vitamin D , he did take for awhile but stopped. He has had low vit D and low normal B12.  He is working on weight reduction and pleased with his progress. He has not received any vaccinations as previous recommended.     Wt Readings from Last 3 Encounters:  10/30/23 189 lb 9.6 oz (86 kg)  04/24/23 197 lb (89.4 kg)  03/11/23 182 lb (82.6 kg)     Prior Data   Labs  04/2023: Varicella IgG reactive, MMR immune  Labs 03/2023:  fecal calprotectin 104. Hep A nonimmune. Hep B surf antigen neg. Hep B core Ab total neg. Hep B surf Ab neg. HCV neg. TB gold neg. Ferritin 44. CRP <7. B12 218. Ferritin 44. Cre 0.95. LFTs normal. Vit D 19.27. CBC normal.   Colonoscopy 02/2023: -preparation of colon is fair -left-sided UC suspected. Inflammation found from anus to transverese colon.  -examined portion of ileum was normal s/p bx, normal -non-bleeding external and internal hemorrhoids -multiple biopsies were obtained in rectum, sigmoid colon, transverse colon, ascending colon and in cecum, consistent with left sided UC -repeat colonoscopy in six months for survillance after treatment -update vaccinations against Hep A/Hep B, PNA, shingles, flu, covid -dermatology referral for rash -given moderate to severe disase and concomitant immune conditions (extensive psoriasis) would be good candidate for IL-23/TNF-A antagonist.  -start PO mesalamine  4.8g daily and rectal mesalamine      Medications   Current Outpatient Medications  Medication Sig Dispense Refill   ALPRAZolam  (XANAX ) 0.5 MG tablet Take 0.5 mg by mouth daily as needed.     Continuous Glucose Sensor (DEXCOM G7 SENSOR) MISC      HUMIRA , 2 PEN, 40 MG/0.4ML pen Inject 40 mg into the skin every 14 (fourteen) days.     HYDROcodone -acetaminophen  (NORCO/VICODIN) 5-325 MG tablet Take 1 tablet by mouth every 8 (eight) hours as needed for moderate pain (pain score 4-6).  Insulin  Disposable Pump (OMNIPOD 5 DEXG7G6 PODS GEN 5) MISC Place onto the skin.     insulin  lispro (HUMALOG ) 100 UNIT/ML injection Inject 1-10 Units into the skin 3 (three) times daily before meals. 10 mL 12   traZODone  (DESYREL ) 150 MG tablet Take 150 mg by mouth at bedtime.     No current facility-administered medications for this visit.    Allergies   Allergies as of 10/30/2023   (No Known Allergies)     Past Medical History   Past  Medical History:  Diagnosis Date   Anxiety    Bipolar 2 disorder, major depressive episode (HCC)    Depression    Diabetes mellitus    Hyperlipidemia    Hypertension    Lumbar facet joint syndrome    Plaque psoriasis     Past Surgical History   Past Surgical History:  Procedure Laterality Date   AMPUTATION Right 05/2020   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   BIOPSY  03/11/2023   Procedure: BIOPSY;  Surgeon: Cinderella Deatrice FALCON, MD;  [facility]: [facility];  Service: Endoscopy;;   COLONOSCOPY WITH PROPOFOL  N/A 03/11/2023   Procedure: COLONOSCOPY WITH PROPOFOL ;  Surgeon: Cinderella Deatrice FALCON, MD;  Location: [facility];  Service: Endoscopy;  Laterality: N/A;  10:00AM, ASA 2, POSSIBLE LOOK AT TERMINAL ILEUM ALSO   FRACTURE SURGERY     reconstructive left knee surgery   KNEE SURGERY     ROTATOR CUFF REPAIR Right     Past Family History   Family History  Problem Relation Age of Onset   Hypertension Mother    Hypertension Father    Inflammatory bowel disease Neg Hx    Colon cancer Neg Hx     Past Social History   Social History   Socioeconomic History   Marital status: Married    Spouse name: Not on file   Number of children: Not on file   Years of education: Not on file   Highest education level: Not on file  Occupational History   Not on file  Tobacco Use   Smoking status: Never   Smokeless tobacco: Current    Types: Snuff  Vaping Use   Vaping status: Never Used  Substance and Sexual Activity   Alcohol use: Not Currently   Drug use: Not Currently   Sexual activity: Not on file  Other Topics Concern   Not on file  Social History Narrative   Not on file   Social Drivers of Health   Financial Resource Strain: High Risk (10/17/2023)   Received from [facility]   Overall Financial Resource Strain (CARDIA)    How hard is it for you to pay for the very basics like food, housing, medical care, and heating?: Hard  Food Insecurity: Food Insecurity Present (10/17/2023)    Received from [facility]   Hunger Vital Sign    Within the past 12 months, you worried that your food would run out before you got the money to buy more.: Sometimes true    Within the past 12 months, the food you bought just didn't last and you didn't have money to get more.: Often true  Transportation Needs: Unmet Transportation Needs (10/17/2023)   Received from [facility] - Transportation    In the past 12 months, has lack of transportation kept you from medical appointments or from getting medications?: Yes    In the past 12 months, has lack of transportation kept you from meetings, work,  or from getting things needed for daily living?: Yes  Physical Activity: Inactive (10/17/2023)   Received from [facility]   Exercise Vital Sign    On average, how many days per week do you engage in moderate to strenuous exercise (like a brisk walk)?: 0 days    Minutes of Exercise per Session: Not on file  Stress: Stress Concern Present (10/17/2023)   Received from [facility] - Occupational Stress Questionnaire    Do you feel stress - tense, restless, nervous, or anxious, or unable to sleep at night because your mind is troubled all the time - these days?: To some extent  Social Connections: Socially Isolated (10/17/2023)   Received from [facility]   Social Network    How would you rate your social network (family, work, friends)?: Little participation, lonely and socially isolated  Intimate Partner Violence: Not At Risk (10/17/2023)   Received from [facility]   HITS    Over the last 12 months how often did your partner physically hurt you?: Never    Over the last 12 months how often did your partner insult you or talk down to you?: Sometimes    Over the last 12 months how often did your partner threaten you with physical harm?: Never    Over the last 12 months how often did your partner scream or curse at you?: Rarely    Review of  Systems   General: Negative for anorexia, weight loss, fever, chills, fatigue, weakness. ENT: Negative for hoarseness, difficulty swallowing , nasal congestion. CV: Negative for chest pain, angina, palpitations, dyspnea on exertion, peripheral edema.  Respiratory: Negative for dyspnea at rest, dyspnea on exertion, cough, sputum, wheezing.  GI: See history of present illness. GU:  Negative for dysuria, hematuria, urinary incontinence, urinary frequency, nocturnal urination.  Endo: Negative for unusual weight change.     Physical Exam   BP 138/87 (BP Location: Right Arm, Patient Position: Sitting, Cuff Size: Normal)   Pulse 87   Temp 99.1 F (37.3 C) (Oral)   Ht 5' 9 (1.753 m)   Wt 189 lb 9.6 oz (86 kg)   SpO2 98%   BMI 28.00 kg/m    General: Well-nourished, well-developed in no acute distress.  Eyes: No icterus. Mouth: Oropharyngeal mucosa moist and pink   Lungs: Clear to auscultation bilaterally.  Heart: Regular rate and rhythm, no murmurs rubs or gallops.  Abdomen: Bowel sounds are normal, nontender, nondistended, no hepatosplenomegaly or masses,  no abdominal bruits or hernia , no rebound or guarding.  Rectal: not performed Extremities: No lower extremity edema. No clubbing or deformities. Neuro: Alert and oriented x 4   Skin: left anterior lower leg with 2 inch round lesion with silver scales c/w psoriasis. Warm and dry, no jaundice.   Psych: Alert and cooperative, normal mood and affect.  Labs   See above  Lab Results  Component Value Date   ALT 37 03/20/2023   AST 18 03/20/2023   ALKPHOS 81 03/20/2023   BILITOT 1.1 03/20/2023   Lab Results  Component Value Date   NA 136 03/20/2023   CL 103 03/20/2023   K 4.7 03/20/2023   CO2 24 03/20/2023   BUN 25 (H) 03/20/2023   CREATININE 0.95 03/20/2023   GFRNONAA >60 03/20/2023   CALCIUM 9.5 03/20/2023   PHOS 3.5 08/26/2021   ALBUMIN 4.2 03/20/2023   GLUCOSE 158 (H) 03/20/2023   Lab Results  Component Value Date  WBC 6.9 03/20/2023   HGB 15.5 03/20/2023   HCT 45.2 03/20/2023   MCV 83.2 03/20/2023   PLT 210 03/20/2023       Imaging Studies   No results found.  Assessment/Plan:      Left sided Ulcerative colitis Doing better with decreased rectal bleeding, occurring on tissue only about once per week. Stools are formed. No abdominal pain or rectal pain. Tolerating Humira .  -continue Humira  40mg  [place] every 14 days -monitor for flares and make us  known if they occur -we discussed if he has flares, we will need to do Humira  Ab and drug level testing -encouraged him to update his vaccines as needed. NO LIVE VACCINES. He is not immune to Hep A or Hep B. Keep Tdap up to date, yearly flu vaccine, PNA vaccine, consider shingles although could consider to wait until age 16 unless he becomes more at risk, ie with change in biologics etc. -plan for colonoscopy after six months of Humira , in about 12/2023, we will schedule closer to the date. -he will need yearly skin check with his dermatologist due to increase risk of skin cancer on biologic therapy.   Psoriasis Improved with Humira , persistent lesions on legs.  - Continue Humira , hopefully he will see more improvement.    Vit D def and low normal B12: -recheck labs       Sonny RAMAN. Ezzard, MHS, PA-C [facility]

## 2023-10-30 NOTE — Telephone Encounter (Signed)
 Will call to schedule once we receive nov schedule

## 2023-10-30 NOTE — Telephone Encounter (Signed)
 Patient seen in the office today. He has been on Humira  for about 5 months. BMs solid, no abd pain, has minute brbpr on toilet tissue about once per week. Appears to be approaching remission/in remission.   Let's plan for colonoscopy in 12/2023 with Dr. Cinderella.  ASA 2.  BMET needed due to type I DM Day of prep: use sliding scale as usual, consider contacting PCP or endocrinologist on dose reduction the day of prep but typically reduce basal rates by 20% at midnight  FYI Dr. Cinderella.

## 2023-10-30 NOTE — Patient Instructions (Addendum)
 Continue Humira  40mg  into skin every 14 days.   Continue to monitor stool frequency, abdominal pain, blood in the stool. If you have frequent loose stools, increased blood in the stool or recurrent abdominal pain, please let me know.   We will update labs to recheck B12, vit D levels.  Plan for colonoscopy in about 6 weeks. We will make arrangements closer to the date.   Recommend updating vaccinations: Please discuss with your PCP -Hep A and Hep B vaccines, according to blood tests you are not immune -Make sure your Tetanus and Pertussis shot stays updated -Pneumonia vaccine -Flu vaccine yearly DO NOT RECEIVE LIVE VACCINES ON HUMIRA 

## 2023-11-01 ENCOUNTER — Other Ambulatory Visit (HOSPITAL_COMMUNITY)
Admission: RE | Admit: 2023-11-01 | Discharge: 2023-11-01 | Disposition: A | Discharge status: Home / Self Care | Source: Ambulatory Visit | Attending: Gastroenterology | Admitting: Gastroenterology

## 2023-11-01 DIAGNOSIS — K5190 Ulcerative colitis, unspecified, without complications: Secondary | ICD-10-CM | POA: Diagnosis present

## 2023-11-01 DIAGNOSIS — E559 Vitamin D deficiency, unspecified: Secondary | ICD-10-CM | POA: Diagnosis present

## 2023-11-01 LAB — VITAMIN D 25 HYDROXY (VIT D DEFICIENCY, FRACTURES): Vit D, 25-Hydroxy: 27.35 ng/mL — ABNORMAL LOW (ref 30–100)

## 2023-11-01 LAB — VITAMIN B12: Vitamin B-12: 235 pg/mL (ref 180–914)

## 2023-11-02 ENCOUNTER — Ambulatory Visit: Payer: Self-pay | Admitting: Gastroenterology

## 2023-11-04 ENCOUNTER — Other Ambulatory Visit: Payer: Self-pay

## 2023-11-04 DIAGNOSIS — E559 Vitamin D deficiency, unspecified: Secondary | ICD-10-CM

## 2023-11-05 ENCOUNTER — Other Ambulatory Visit (HOSPITAL_COMMUNITY): Payer: Self-pay | Admitting: Psychiatry

## 2023-11-14 MED ORDER — PEG 3350-KCL-NA BICARB-NACL 420 G PO SOLR
4000.0000 mL | Freq: Once | ORAL | 0 refills | Status: AC
Start: 2023-11-14 — End: 2023-11-14

## 2023-11-14 NOTE — Telephone Encounter (Signed)
 Spoke with patient, scheduled TCS for 12/16/2023 at 7:30am. Rx sent to pharmacy. Instructions sent to mychart.

## 2023-11-14 NOTE — Addendum Note (Signed)
 Addended by: DALLIE LIONEL RAMAN on: 11/14/2023 09:18 AM   Modules accepted: Orders

## 2023-11-21 ENCOUNTER — Telehealth: Payer: Self-pay

## 2023-11-21 ENCOUNTER — Other Ambulatory Visit: Payer: Self-pay | Admitting: Gastroenterology

## 2023-11-21 NOTE — Telephone Encounter (Signed)
 [facility] called stating that they need a renewal Rx for this pt's Humira . Pt was last seen on 10/30/23.

## 2023-12-16 ENCOUNTER — Encounter (HOSPITAL_COMMUNITY): Payer: Self-pay | Admitting: Certified Registered"

## 2023-12-16 ENCOUNTER — Ambulatory Visit (HOSPITAL_COMMUNITY): Admission: RE | Admit: 2023-12-16 | Source: Home / Self Care | Admitting: Gastroenterology

## 2023-12-16 ENCOUNTER — Encounter (HOSPITAL_COMMUNITY): Admission: RE | Payer: Self-pay | Source: Home / Self Care

## 2023-12-16 SURGERY — COLONOSCOPY
Anesthesia: Choice

## 2023-12-24 ENCOUNTER — Ambulatory Visit: Admitting: Physician Assistant

## 2024-02-13 ENCOUNTER — Encounter: Payer: Self-pay | Admitting: Gastroenterology

## 2024-02-17 ENCOUNTER — Other Ambulatory Visit: Payer: Self-pay

## 2024-02-17 DIAGNOSIS — E559 Vitamin D deficiency, unspecified: Secondary | ICD-10-CM

## 2024-02-26 ENCOUNTER — Telehealth: Payer: Self-pay

## 2024-02-26 NOTE — Telephone Encounter (Signed)
 [facility] called stating that they have been unable to reach the pt since November regarding his Humira . I tried contacting the pt and it went to vm. Was unable to leave a message due to voicemail being full. Will send pt a MyChart message requesting pt to contact the office asap.

## 2024-03-17 NOTE — Telephone Encounter (Signed)
Can we follow-up on this?

## 2024-03-18 MED ORDER — HUMIRA-CD/UC/HS STARTER 80 MG/0.8ML ~~LOC~~ AJKT: AUTO-INJECTOR | SUBCUTANEOUS | 0 refills | Status: AC

## 2024-03-18 MED ORDER — HUMIRA (2 PEN) 40 MG/0.4ML ~~LOC~~ AJKT: 40.0000 mg | AUTO-INJECTOR | SUBCUTANEOUS | 11 refills | Status: AC

## 2024-03-18 NOTE — Telephone Encounter (Signed)
 He will need to have loading dose. I don't understand why this patient allowed himself to be off therapy. He was just seen in 10/2023.   Please let patient know that he will have loading dose again since off medication for more than 2 months.  Humira  80mg  [place] on day 1,2,15 and then 40mg  every 2 weeks starting on day 29.   Please let patient know that allowing for gaps in therapy increasing his risk of developing antibodies to the medication and this can lead to medication no longer working or even have a reaction to the medication.   Please make sure he has follow up ov in 3 months.

## 2024-03-18 NOTE — Addendum Note (Signed)
 Addended by: EZZARD SONNY RAMAN on: 03/18/2024 12:42 PM   Modules accepted: Orders
